# Patient Record
Sex: Male | Born: 1970 | Race: White | Hispanic: No | Marital: Single | Smoking: Current every day smoker
Health system: Southern US, Community
[De-identification: ages and names within clinical notes are randomized; demographics above are authoritative.]

## PROBLEM LIST (undated history)

## (undated) DIAGNOSIS — Z6841 Body Mass Index (BMI) 40.0 and over, adult: Secondary | ICD-10-CM

## (undated) DIAGNOSIS — R079 Chest pain, unspecified: Secondary | ICD-10-CM

## (undated) HISTORY — DX: Chest pain, unspecified: R07.9

## (undated) HISTORY — DX: Morbid (severe) obesity due to excess calories: E66.01

## (undated) HISTORY — DX: Body Mass Index (BMI) 40.0 and over, adult: Z684

---

## 2009-08-23 ENCOUNTER — Emergency Department: Payer: Self-pay | Admitting: Emergency Medicine

## 2009-11-17 ENCOUNTER — Emergency Department: Payer: Self-pay | Admitting: Emergency Medicine

## 2009-12-03 ENCOUNTER — Emergency Department: Payer: Self-pay | Admitting: Emergency Medicine

## 2010-10-24 ENCOUNTER — Emergency Department: Payer: Self-pay | Admitting: Emergency Medicine

## 2011-12-05 ENCOUNTER — Emergency Department (HOSPITAL_COMMUNITY): Payer: Self-pay

## 2011-12-05 ENCOUNTER — Observation Stay (HOSPITAL_COMMUNITY)
Admission: EM | Admit: 2011-12-05 | Discharge: 2011-12-06 | DRG: 313 | Disposition: A | Discharge status: Home / Self Care | Payer: Self-pay | Attending: Cardiology | Admitting: Cardiology

## 2011-12-05 ENCOUNTER — Encounter (HOSPITAL_COMMUNITY): Payer: Self-pay | Admitting: *Deleted

## 2011-12-05 DIAGNOSIS — E876 Hypokalemia: Secondary | ICD-10-CM | POA: Diagnosis present

## 2011-12-05 DIAGNOSIS — Z7982 Long term (current) use of aspirin: Secondary | ICD-10-CM

## 2011-12-05 DIAGNOSIS — E119 Type 2 diabetes mellitus without complications: Secondary | ICD-10-CM | POA: Diagnosis present

## 2011-12-05 DIAGNOSIS — Z79899 Other long term (current) drug therapy: Secondary | ICD-10-CM

## 2011-12-05 DIAGNOSIS — Z6841 Body Mass Index (BMI) 40.0 and over, adult: Secondary | ICD-10-CM

## 2011-12-05 DIAGNOSIS — R079 Chest pain, unspecified: Principal | ICD-10-CM | POA: Diagnosis present

## 2011-12-05 DIAGNOSIS — Z87891 Personal history of nicotine dependence: Secondary | ICD-10-CM

## 2011-12-05 DIAGNOSIS — Z8249 Family history of ischemic heart disease and other diseases of the circulatory system: Secondary | ICD-10-CM

## 2011-12-05 LAB — BASIC METABOLIC PANEL
CO2: 25 mEq/L (ref 19–32)
Calcium: 9 mg/dL (ref 8.4–10.5)
Glucose, Bld: 134 mg/dL — ABNORMAL HIGH (ref 70–99)
Sodium: 141 mEq/L (ref 135–145)

## 2011-12-05 LAB — CBC
Hemoglobin: 14.7 g/dL (ref 13.0–17.0)
MCH: 29.6 pg (ref 26.0–34.0)
RBC: 4.96 MIL/uL (ref 4.22–5.81)

## 2011-12-05 LAB — POCT I-STAT TROPONIN I: Troponin i, poc: 0 ng/mL (ref 0.00–0.08)

## 2011-12-05 LAB — GLUCOSE, CAPILLARY: Glucose-Capillary: 168 mg/dL — ABNORMAL HIGH (ref 70–99)

## 2011-12-05 MED ORDER — ONDANSETRON HCL 4 MG/2ML IJ SOLN
4.0000 mg | Freq: Once | INTRAMUSCULAR | Status: AC
  Administered 2011-12-05: 4 mg via INTRAVENOUS
  Filled 2011-12-05: qty 2

## 2011-12-05 MED ORDER — POTASSIUM CHLORIDE 20 MEQ/15ML (10%) PO LIQD
40.0000 meq | Freq: Once | ORAL | Status: AC
  Administered 2011-12-05: 40 meq via ORAL
  Filled 2011-12-05: qty 30

## 2011-12-05 MED ORDER — ASPIRIN 325 MG PO TABS: 325.0000 mg | ORAL_TABLET | ORAL | Status: DC

## 2011-12-05 MED ORDER — NITROGLYCERIN 0.4 MG SL SUBL: 0.4000 mg | SUBLINGUAL_TABLET | SUBLINGUAL | Status: DC | PRN

## 2011-12-05 NOTE — ED Provider Notes (Signed)
History     CSN: [id number]  Arrival date & time 12/05/11  1757   First MD Initiated Contact with Patient 12/05/11 1801      Chief Complaint  Patient presents with  . Chest Pain    (Consider location/radiation/quality/duration/timing/severity/associated sxs/prior treatment) HPI  41 y/o NIDDM male INAD c/o diaphoresis, nausea and SOB with pressure-like sternal chest pain 5/10, non radiating. Onset was while he was cleaning at 4 PM today. Pt then became lightheaded. Pt affirms palpations states he drinks energy drinks.  Pt also has a singe episode of diarrhea. Prior smoker quit 2 years ago.  Pt received 325 ASA and 4 SL nitroglycerin pain is 3/10. Pt's father died from CAD at 64.    No past medical history on file.  No past surgical history on file.  No family history on file.  History  Substance Use Topics  . Smoking status: Not on file  . Smokeless tobacco: Not on file  . Alcohol Use: Not on file      Review of Systems  Constitutional: Negative for fever.  Eyes: Negative for visual disturbance.  Respiratory: Positive for shortness of breath.   Cardiovascular: Positive for chest pain.  Gastrointestinal: Positive for nausea. Negative for abdominal pain.  Musculoskeletal: Negative for back pain.  Skin: Negative for rash.  Neurological: Positive for light-headedness. Negative for syncope.  All other systems reviewed and are negative.    Allergies  Review of patient's allergies indicates no known allergies.  Home Medications   Current Outpatient Rx  Name Route Sig Dispense Refill  . DIAZEPAM 10 MG PO TABS Oral Take 10 mg by mouth every 12 (twelve) hours as needed. For anxiety    . GLIPIZIDE 10 MG PO TABS Oral Take 10 mg by mouth 2 (two) times daily before a meal.    . METFORMIN HCL ER (OSM) 1000 MG PO TB24 Oral Take 1,000 mg by mouth 2 (two) times daily with a meal.    . SERTRALINE HCL 50 MG PO TABS Oral Take 25 mg by mouth daily.      There were no vitals  taken for this visit.  Physical Exam  Constitutional: He appears well-developed and well-nourished.       Obese  HENT:  Head: Normocephalic and atraumatic.  Eyes: Conjunctivae and EOM are normal. Pupils are equal, round, and reactive to light.  Neck: Normal range of motion. Neck supple.  Cardiovascular: Normal rate, regular rhythm, normal heart sounds and intact distal pulses.   Pulmonary/Chest: Effort normal and breath sounds normal. No respiratory distress. He has no wheezes. He has no rales. He exhibits no tenderness.  Abdominal: Soft. Bowel sounds are normal. There is no tenderness.  Musculoskeletal: He exhibits no edema.  Neurological: He is alert.  Skin: Skin is warm and dry.  Psychiatric: He has a normal mood and affect.    ED Course  Procedures (including critical care time)  Labs Reviewed  CBC - Abnormal; Notable for the following:    WBC 15.6 (*)     All other components within normal limits  BASIC METABOLIC PANEL - Abnormal; Notable for the following:    Potassium 3.0 (*)     Glucose, Bld 134 (*)     All other components within normal limits  D-DIMER, QUANTITATIVE  POCT I-STAT TROPONIN I   Chest Portable 1 View  12/05/2011  *RADIOLOGY REPORT*  Clinical Data: Chest pain  PORTABLE CHEST - 1 VIEW  Comparison: None.  Findings: Mildly increased interstitial markings.  Mild interstitial edema.  No focal consolidation.  No pleural effusion or pneumothorax.  The heart is top normal in size.  IMPRESSION: No evidence of acute cardiopulmonary disease.  Original Report Authenticated By: Charline Bills, M.D.     Date: 12/05/2011  Rate: 101    Rhythm: normal sinus rhythm  QRS Axis: normal  Intervals: normal  ST/T Wave abnormalities: normal  Conduction Disutrbances:none  Narrative Interpretation:   Old EKG Reviewed: none available   1. Chest pain       MDM  41 y/o diabetic male with chest pressure, SOB and Nausea. EKG shows no acute changes but shows mild sinus  tachycardia. Chest x-ray shows borderline cardiomegaly with no acute findings. D-dimer negative.   Pt found to be mildly hypokalemic at 3.1 and repeated orally with  Patient reexamined by me at 20:30 states pain has resolved fully denies nausea vomiting shortness of breath.  Discussed with attending who advises CDU  Cardiac protocol. As Pt is obese, he is not a candidate for CT and There will be no stress echo available as it is the weekend. Will consult cardiology   Cardiology consult by Dr. Maryelizabeth Kaufmann appreciated, patient will be admitted under his care for new onset angina evaluation.     Joni Reining Markeem Noreen, PA-C 12/06/11 0015

## 2011-12-05 NOTE — ED Notes (Signed)
Cleaning house, starting to have cp, diaphoretic about one hour ago; midsternal CP, described as pressure initially rated at 6/10, also a/w SOB, and nausea; 324mg  ASA given, 3 nitro given- now is pain free after 3rd nitro; 20g LFA, 2L [place] 98%, ST (100-110's) on monitor, BP 140/90, last BP 120 palp sys; hx DM

## 2011-12-05 NOTE — H&P (Signed)
Primary Cardiologist: Dr. Daleen Squibb Corinda Gubler)  Chief Complaint: chest pain  HPI:  Derrick Gonzales is a pleasant 41 year old gentleman with diabetes, obesity, presumptive sleep apnea not on CPAP, and a family history of premature coronary disease who presents with a chest pain.  At approximately 4pm today (7/19) he was exerting himself while moving into a new home, including activities like lifting. In an air-conditioned room, he suddenly felt hot and became diaphoretic, together with the development of central chest pressure which he rated at 6-7/10. It radiated to his left arm. He denies associated nausea. No palpitations. He called EMS and was given serial doses of SL NTG in the ambulance with some relief. His chest pain fully dissipated while in the [facility] Emergency Department. He is currently chest pain free and comfortable. Of note, he has never before experienced any episode similar to what he experienced today.   He otherwise denies shortness of breath. No palpitations. No headaches. He does snore and his wife notes that he occasionally will temporarily stop breathing and then spontaneously re-start. He had a sleep study in the past which was apparently positive, but he was never fitted for a CPAP mask.  Past Medical History  Diagnosis Date  . Diabetes mellitus   - Anger issues for which he occasionally takes Valium.   History reviewed. No pertinent past surgical history.  Social History:  reports that he has quit smoking. He does not have any smokeless tobacco history on file. He reports that he does not drink alcohol or use illicit drugs. Former smoker, quit 1.5 years ago, previously smoked 3 ppd for ~ 20 years  Family History: Premature CAD - father had CABG in his 30s and then passed away in his sleep at age 79; brother had MI in his 98s DM  Allergies: No Known Allergies  Medications: Metformin, unknown dose Glucotrol, unknown dose  ROS: As per HPI; otherwise is  comprehensively negative  Exam: 108/74 80 16 100% RA GEN no acute distress JVP 6-7 cm H2O CTA bilaterally S1 S2 no murmurs/rubs/gallops Abdomen obese, soft, nontender, nondistended Ext warm & well-perfused, no edema Neuro A&Ox3 Skin warm & dry  Labs:  Results for orders placed during the hospital encounter of 12/05/11 (from the past 48 hour(s))  CBC     Status: Abnormal   Collection Time   12/05/11  6:28 PM      Component Value Range Comment   WBC 15.6 (*) 4.0 - 10.5 K/uL    RBC 4.96  4.22 - 5.81 MIL/uL    Hemoglobin 14.7  13.0 - 17.0 g/dL    HCT 16.1  09.6 - 04.5 %    MCV 85.7  78.0 - 100.0 fL    MCH 29.6  26.0 - 34.0 pg    MCHC 34.6  30.0 - 36.0 g/dL    RDW 40.9  81.1 - 91.4 %    Platelets 239  150 - 400 K/uL   BASIC METABOLIC PANEL     Status: Abnormal   Collection Time   12/05/11  6:28 PM      Component Value Range Comment   Sodium 141  135 - 145 mEq/L    Potassium 3.0 (*) 3.5 - 5.1 mEq/L    Chloride 103  96 - 112 mEq/L    CO2 25  19 - 32 mEq/L    Glucose, Bld 134 (*) 70 - 99 mg/dL    BUN 10  6 - 23 mg/dL    Creatinine, Ser  0.56  0.50 - 1.35 mg/dL    Calcium 9.0  8.4 - 16.1 mg/dL    GFR calc non Af Amer >90  >90 mL/min    GFR calc Af Amer >90  >90 mL/min   D-DIMER, QUANTITATIVE     Status: Normal   Collection Time   12/05/11  6:28 PM      Component Value Range Comment   D-Dimer, Quant <0.22  0.00 - 0.48 ug/mL-FEU   POCT I-STAT TROPONIN I     Status: Normal   Collection Time   12/05/11  7:47 PM      Component Value Range Comment   Troponin i, poc 0.00  0.00 - 0.08 ng/mL    Comment 3            POCT I-STAT TROPONIN I     Status: Normal   Collection Time   12/05/11 10:34 PM      Component Value Range Comment   Troponin i, poc 0.01  0.00 - 0.08 ng/mL    Comment 3             ECG: normal sinus rhythm with ST segment changes; borderline inferior Q waves  Assessment/Plan 41 year old gentleman with a significant family history of premature CAD who presents  with an episode of chest tightness & diaphoresis concerning for a first-time occurrence of unstable angina. He is now chest pain free with no enzymatic or electrocardiographic evidence of acute or ongoing ischemia. He is a former heavy smoker (50-60 pack years) and has diabetes. Given his risk factors together with a compelling family history, I think he warrants inpatient admission for serial enzyme draws and close monitoring. Should his symptoms not recur and should his ECGs/enzymes not evolve, then I think we should pursue a provocative noninvasive test in the morning. If he has an evolution in his symptoms.ECGs/enzymes, then we can then pivot towards a more invasive evaluation via coronary angiography.   1) Chest pain, now resolved - Serial enzymes overnight with IV Heparin while we await further biomarker results - Aspirin, Statin therapy - Provocative testing vs cath per discussion above - Lipid profile to be checked, along with a HA1C  2) Hypokalemia - Will recheck a BMP and Mg and replete as necessary  3) Diabetes - Hold Metformin for now in light of the possibility of a dye load, depending on the course of events in the next 24-48 hours  I discussed my impressions with the patient and his family. Their questions were answered to the best of my ability.  Jamesina Gaugh 12/05/2011, 11:23 PM Cardiology Fellow On-Call [phone]

## 2011-12-05 NOTE — ED Notes (Signed)
CBG 168. 

## 2011-12-05 NOTE — ED Notes (Signed)
Pt placed on cardiac monitor 

## 2011-12-06 DIAGNOSIS — Z6841 Body Mass Index (BMI) 40.0 and over, adult: Secondary | ICD-10-CM

## 2011-12-06 DIAGNOSIS — R079 Chest pain, unspecified: Secondary | ICD-10-CM

## 2011-12-06 DIAGNOSIS — R072 Precordial pain: Secondary | ICD-10-CM

## 2011-12-06 HISTORY — DX: Morbid (severe) obesity due to excess calories: E66.01

## 2011-12-06 HISTORY — DX: Chest pain, unspecified: R07.9

## 2011-12-06 LAB — BASIC METABOLIC PANEL
BUN: 9 mg/dL (ref 6–23)
CO2: 29 mEq/L (ref 19–32)
Glucose, Bld: 247 mg/dL — ABNORMAL HIGH (ref 70–99)
Potassium: 3.8 mEq/L (ref 3.5–5.1)
Sodium: 135 mEq/L (ref 135–145)

## 2011-12-06 LAB — CARDIAC PANEL(CRET KIN+CKTOT+MB+TROPI)
CK, MB: 1.8 ng/mL (ref 0.3–4.0)
Relative Index: INVALID (ref 0.0–2.5)
Relative Index: INVALID (ref 0.0–2.5)
Total CK: 67 U/L (ref 7–232)
Total CK: 74 U/L (ref 7–232)
Total CK: 73 U/L (ref 7–232)
Troponin I: <0.3 ng/mL (ref <0.30)

## 2011-12-06 LAB — DIFFERENTIAL
Basophils Absolute: 0 10*3/uL (ref 0.0–0.1)
Basophils Relative: 0 % (ref 0–1)
Lymphocytes Relative: 35 % (ref 12–46)
Monocytes Absolute: 1 10*3/uL (ref 0.1–1.0)
Neutro Abs: 7 10*3/uL (ref 1.7–7.7)

## 2011-12-06 LAB — GLUCOSE, CAPILLARY: Glucose-Capillary: 140 mg/dL — ABNORMAL HIGH (ref 70–99)

## 2011-12-06 LAB — CBC
HCT: 39.7 % (ref 39.0–52.0)
Hemoglobin: 14.5 g/dL (ref 13.0–17.0)
MCH: 29.6 pg (ref 26.0–34.0)
MCHC: 34.5 g/dL (ref 30.0–36.0)
Platelets: 210 10*3/uL (ref 150–400)
RBC: 4.9 MIL/uL (ref 4.22–5.81)
RDW: 12.7 % (ref 11.5–15.5)
WBC: 12.9 10*3/uL — ABNORMAL HIGH (ref 4.0–10.5)

## 2011-12-06 LAB — MAGNESIUM: Magnesium: 2.1 mg/dL (ref 1.5–2.5)

## 2011-12-06 LAB — LIPID PANEL
HDL: 39 mg/dL — ABNORMAL LOW (ref >39)
LDL Cholesterol: 108 mg/dL — ABNORMAL HIGH (ref 0–99)
Triglycerides: 146 mg/dL (ref <150)

## 2011-12-06 LAB — HEMOGLOBIN A1C: Mean Plasma Glucose: 214 mg/dL — ABNORMAL HIGH (ref <117)

## 2011-12-06 LAB — TSH: TSH: 3.147 u[IU]/mL (ref 0.350–4.500)

## 2011-12-06 LAB — APTT: aPTT: 55 seconds — ABNORMAL HIGH (ref 24–37)

## 2011-12-06 LAB — CREATININE, SERUM
Creatinine, Ser: 0.64 mg/dL (ref 0.50–1.35)
GFR calc non Af Amer: >90 mL/min (ref >90)

## 2011-12-06 MED ORDER — ASPIRIN 81 MG PO TBEC: 81.0000 mg | DELAYED_RELEASE_TABLET | Freq: Every day | ORAL | Status: AC

## 2011-12-06 MED ORDER — ASPIRIN EC 81 MG PO TBEC: 81.0000 mg | DELAYED_RELEASE_TABLET | Freq: Every day | ORAL | Status: DC

## 2011-12-06 MED ORDER — ASPIRIN 81 MG PO CHEW: 324.0000 mg | CHEWABLE_TABLET | ORAL | Status: DC

## 2011-12-06 MED ORDER — POTASSIUM CHLORIDE CRYS ER 20 MEQ PO TBCR
40.0000 meq | EXTENDED_RELEASE_TABLET | Freq: Once | ORAL | Status: AC
  Administered 2011-12-06: 40 meq via ORAL
  Filled 2011-12-06: qty 2

## 2011-12-06 MED ORDER — ONDANSETRON HCL 4 MG/2ML IJ SOLN: 4.0000 mg | Freq: Four times a day (QID) | INTRAMUSCULAR | Status: DC | PRN

## 2011-12-06 MED ORDER — HEPARIN BOLUS VIA INFUSION
3000.0000 [IU] | Freq: Once | INTRAVENOUS | Status: AC
  Administered 2011-12-06: 3000 [IU] via INTRAVENOUS
  Filled 2011-12-06: qty 3000

## 2011-12-06 MED ORDER — NITROGLYCERIN 0.4 MG SL SUBL: 0.4000 mg | SUBLINGUAL_TABLET | SUBLINGUAL | Status: DC | PRN

## 2011-12-06 MED ORDER — ASPIRIN 300 MG RE SUPP
300.0000 mg | RECTAL | Status: DC
  Filled 2011-12-06: qty 1

## 2011-12-06 MED ORDER — HEPARIN (PORCINE) IN NACL 100-0.45 UNIT/ML-% IJ SOLN
1700.0000 [IU]/h | INTRAMUSCULAR | Status: DC
  Administered 2011-12-06: 1700 [IU]/h via INTRAVENOUS
  Administered 2011-12-06: 1400 [IU]/h via INTRAVENOUS
  Administered 2011-12-06: 1700 [IU]/h via INTRAVENOUS
  Filled 2011-12-06 (×4): qty 250

## 2011-12-06 MED ORDER — SODIUM CHLORIDE 0.9 % IV SOLN: 250.0000 mL | INTRAVENOUS | Status: DC | PRN

## 2011-12-06 MED ORDER — ACETAMINOPHEN 325 MG PO TABS: 650.0000 mg | ORAL_TABLET | ORAL | Status: DC | PRN

## 2011-12-06 MED ORDER — SODIUM CHLORIDE 0.9 % IJ SOLN: 3.0000 mL | INTRAMUSCULAR | Status: DC | PRN

## 2011-12-06 MED ORDER — GLIPIZIDE 10 MG PO TABS
10.0000 mg | ORAL_TABLET | Freq: Two times a day (BID) | ORAL | Status: DC
  Filled 2011-12-06 (×2): qty 1

## 2011-12-06 MED ORDER — HEPARIN BOLUS VIA INFUSION
4000.0000 [IU] | Freq: Once | INTRAVENOUS | Status: AC
  Administered 2011-12-06: 4000 [IU] via INTRAVENOUS

## 2011-12-06 MED ORDER — SODIUM CHLORIDE 0.9 % IJ SOLN: 3.0000 mL | Freq: Two times a day (BID) | INTRAMUSCULAR | Status: DC

## 2011-12-06 MED ORDER — ATORVASTATIN CALCIUM 20 MG PO TABS
20.0000 mg | ORAL_TABLET | Freq: Every day | ORAL | Status: DC
  Filled 2011-12-06: qty 1

## 2011-12-06 NOTE — Progress Notes (Signed)
Pt ambulated in hallway 300 ft and tolerated activity well. No chest pain while ambulating. Will continue to monitor.

## 2011-12-06 NOTE — Progress Notes (Signed)
Pt discharged per MD order. Discharge papers and teaching completed,all questions answered. All Rx'es given. Pt aware of follow up appointments.

## 2011-12-06 NOTE — ED Provider Notes (Signed)
Medical screening examination/treatment/procedure(s) were conducted as a shared visit with non-physician practitioner(s) and myself.  I personally evaluated the patient during the encounter   Curties Conigliaro, MD 12/06/11 1503 

## 2011-12-06 NOTE — Progress Notes (Signed)
SUBJECTIVE:  Patient has been pain free since yesterday evening at 6:30 PM.  Has been walking in the room without any difficulty or discomfort.  Yestrday, pain occurred with exertion.  OBJECTIVE:   Vitals:   Filed Vitals:   12/05/11 2230 12/06/11 0011 12/06/11 0131 12/06/11 0352  BP: 108/74 122/74 131/73 101/62  Pulse: 95 94 89 84  Temp:  98 F (36.7 C) 98.4 F (36.9 C) 97.3 F (36.3 C)  TempSrc:  Oral Oral Oral  Resp: 20 16 20 20   Height:  5\' 6"  (1.676 m) 5\' 6"  (1.676 m)   Weight:  136.079 kg (300 lb) 131.906 kg (290 lb 12.8 oz)   SpO2: 100% 99% 93% 94%   I&O's:   Intake/Output Summary (Last 24 hours) at 12/06/11 1326 Last data filed at 12/06/11 1244  Gross per 24 hour  Intake 311.17 ml  Output   1000 ml  Net -688.83 ml   TELEMETRY: Reviewed telemetry pt in NSR:     PHYSICAL EXAM General: Well developed, well nourished, in no acute distress Head:    Normal cephalic and atramatic  Lungs:   Clear bilaterally to auscultation and percussion. Heart:   HRRR S1 S2  Abdomen: obese Msk:   Normal strength and tone for age. Extremities:   No clubbing, cyanosis or edema.  DP +1 Neuro: Alert and oriented X 3. Psych:  Good affect, responds appropriately   LABS: Basic Metabolic Panel:  Basename 12/06/11 0330 12/05/11 1828  NA -- 141  K -- 3.0*  CL -- 103  CO2 -- 25  GLUCOSE -- 134*  BUN -- 10  CREATININE 0.64 0.56  CALCIUM -- 9.0  MG 2.1 --  PHOS -- --   Liver Function Tests: No results found for this basename: AST:2,ALT:2,ALKPHOS:2,BILITOT:2,PROT:2,ALBUMIN:2 in the last 72 hours No results found for this basename: LIPASE:2,AMYLASE:2 in the last 72 hours CBC:  Basename 12/06/11 0839 12/06/11 0330  WBC 11.5* 12.9*  NEUTROABS -- 7.0  HGB 14.5 13.7  HCT 42.3 39.7  MCV 86.3 86.1  PLT 219 210   Cardiac Enzymes:  Basename 12/06/11 0837 12/06/11 0330  CKTOTAL 74 67  CKMB 1.7 1.6  CKMBINDEX -- --  TROPONINI <0.30 <0.30   BNP: No components found with this  basename: POCBNP:3 D-Dimer:  [facility] 12/05/11 1828  DDIMER <0.22   Hemoglobin A1C: No results found for this basename: HGBA1C in the last 72 hours Fasting Lipid Panel:  Basename 12/06/11 0330  CHOL 176  HDL 39*  LDLCALC 108*  TRIG 146  CHOLHDL 4.5  LDLDIRECT --   Thyroid Function Tests: No results found for this basename: TSH,T4TOTAL,FREET3,T3FREE,THYROIDAB in the last 72 hours Anemia Panel: No results found for this basename: VITAMINB12,FOLATE,FERRITIN,TIBC,IRON,RETICCTPCT in the last 72 hours Coag Panel:   No results found for this basename: INR, PROTIME    RADIOLOGY: Chest Portable 1 View  12/05/2011  *RADIOLOGY REPORT*  Clinical Data: Chest pain  PORTABLE CHEST - 1 VIEW  Comparison: None.  Findings: Mildly increased interstitial markings.  Mild interstitial edema.  No focal consolidation.  No pleural effusion or pneumothorax.  The heart is top normal in size.  IMPRESSION: No evidence of acute cardiopulmonary disease.  Original Report Authenticated By: Charline Bills, M.D.      ASSESSMENT: Chest pain, hypokalemia,   PLAN:  Ruled out for MI so far.  One more set of enzymes pending.  Stop heparin.  If pain free with walking and last set of enzymes normal, would likely discharge for 2 day  outpatient cardiolite.  If he rules in or has recurrent sx, will plan for cath Monday as inpatient.  Recheck potasium with final set of cardiac enzymes.  Corky Crafts., MD  12/06/2011  1:26 PM

## 2011-12-06 NOTE — Discharge Summary (Signed)
CARDIOLOGY DISCHARGE SUMMARY   Patient ID: Derrick Gonzales MRN: [id number] DOB/AGE: 07/28/1970 41 y.o.  Admit date: 12/05/2011 Discharge date: 12/06/2011  Primary Discharge Diagnosis:  Chest pain, cardiac enzymes negative for MI and outpatient stress testing planned Secondary Discharge Diagnosis:  Past Medical History  Diagnosis Date   Mobid obesity   . Diabetes mellitus    Procedures: 2-D echocardiogram  Hospital Course: Derrick Gonzales is a 41 year old male with no previous history of coronary artery disease. He had prolonged chest pain with exertion and came to the hospital where he was admitted for further evaluation and treatment.  His chest pain resolved after he called EMS and was given nitroglycerin. His cardiac enzymes remained negative for MI and his ECG showed no acute changes.  His labs were reviewed. His white count was mildly elevated but there were no other signs or symptoms of infection. His blood sugar was slightly elevated but he is a known diabetic. His LDL was 108 but without further evidence of coronary artery disease, no statin will be initiated at this time.   On 12/06/2011, he was seen by Dr. Eldridge Dace. He had aspirin 81 mg daily added to his medication regimen and was given a prescription for sublingual nitroglycerin as well. He was ambulating without chest pain or shortness of breath and considered stable for discharge, to follow up as an outpatient with a stress test.   Labs:  Lab Results  Component Value Date   WBC 11.5* 12/06/2011   HGB 14.5 12/06/2011   HCT 42.3 12/06/2011   MCV 86.3 12/06/2011   PLT 219 12/06/2011    Lab 12/06/11 1339  NA 135  K 3.8  CL 97  CO2 29  BUN 9  CREATININE 0.68  CALCIUM 8.9  PROT --  BILITOT --  ALKPHOS --  ALT --  AST --  GLUCOSE 247*    Basename 12/06/11 1343 12/06/11 0837 12/06/11 0330  CKTOTAL 73 74 67  CKMB 1.8 1.7 1.6  CKMBINDEX -- -- --  TROPONINI <0.30 <0.30 <0.30   Lipid Panel     Component Value  Date/Time   CHOL 176 12/06/2011 0330   TRIG 146 12/06/2011 0330   HDL 39* 12/06/2011 0330   CHOLHDL 4.5 12/06/2011 0330   VLDL 29 12/06/2011 0330   LDLCALC 108* 12/06/2011 0330   Radiology: Chest Portable 1 View 12/05/2011  *RADIOLOGY REPORT*  Clinical Data: Chest pain  PORTABLE CHEST - 1 VIEW  Comparison: None.  Findings: Mildly increased interstitial markings.  Mild interstitial edema.  No focal consolidation.  No pleural effusion or pneumothorax.  The heart is top normal in size.  IMPRESSION: No evidence of acute cardiopulmonary disease.  Original Report Authenticated By: Charline Bills, M.D.   EKG: 06-Dec-2011 04:04:49  Normal sinus rhythm Normal ECG 38mm/s 62mm/mV 100Hz  8.0.1 12SL 241 HD CID: 1 Referred by: Maisie Fus WALL Confirmed By: Lance Muss MD Vent. rate 83 BPM PR interval 138 ms QRS duration 98 ms QT/QTc 386/453 ms P-R-T axes 46 52 43  Echo: 12/06/2011 Study Conclusions - Left ventricle: The cavity size was normal. There was mild focal basal hypertrophy of the septum. Systolic function was normal. The estimated ejection fraction was in the range of 60% to 65%. Wall motion was normal; there were no regional wall motion abnormalities. Features are consistent with a pseudonormal left ventricular filling pattern, with concomitant abnormal relaxation and increased filling pressure (grade 2 diastolic dysfunction). - Mitral valve: Trivial regurgitation. - Left atrium: The atrium was mildly dilated. -  Tricuspid valve: Trivial regurgitation. - Pericardium, extracardiac: There was no pericardial effusion.   FOLLOW UP PLANS AND APPOINTMENTS No Known Allergies  Medication List  As of 12/06/2011  3:45 PM   TAKE these medications         aspirin 81 MG EC tablet   Take 1 tablet (81 mg total) by mouth daily.      diazepam 10 MG tablet   Commonly known as: VALIUM   Take 10 mg by mouth every 12 (twelve) hours as needed. For anxiety      glipiZIDE 10 MG tablet   Commonly  known as: GLUCOTROL   Take 10 mg by mouth 2 (two) times daily before a meal.      metformin 1000 MG (OSM) 24 hr tablet   Commonly known as: FORTAMET   Take 1,000 mg by mouth 2 (two) times daily with a meal.      nitroGLYCERIN 0.4 MG SL tablet   Commonly known as: NITROSTAT   Place 1 tablet (0.4 mg total) under the tongue every 5 (five) minutes as needed for chest pain.      sertraline 50 MG tablet   Commonly known as: ZOLOFT   Take 25 mg by mouth daily.            Follow-up Information    Follow up with [facility] (The office will call.)    Contact information:   [address] [place] [phone]         BRING ALL MEDICATIONS WITH YOU TO FOLLOW UP APPOINTMENTS  Time spent with patient to include physician time: 33 min Signed: Theodore Demark 12/06/2011, 3:27 PM Co-Sign MD

## 2011-12-06 NOTE — Progress Notes (Signed)
ANTICOAGULATION CONSULT NOTE - Follow Up Consult  Pharmacy Consult for heparin Indication: chest pain/ACS  No Known Allergies  Patient Measurements: Height: 5\' 6"  (167.6 cm) Weight: 290 lb 12.8 oz (131.906 kg) IBW/kg (Calculated) : 63.8  Heparin Dosing Weight: 96 Kg  Vital Signs: Temp: 97.3 F (36.3 C) (07/20 0352) Temp src: Oral (07/20 0352) BP: 101/62 mmHg (07/20 0352) Pulse Rate: 84  (07/20 0352)  Labs:  Basename 12/06/11 0839 12/06/11 0330 12/05/11 1828  HGB 14.5 13.7 --  HCT 42.3 39.7 42.5  PLT 219 210 239  APTT -- 55* --  LABPROT -- -- --  INR -- -- --  HEPARINUNFRC 0.17* -- --  CREATININE -- 0.64 0.56  CKTOTAL -- 67 --  CKMB -- 1.6 --  TROPONINI -- <0.30 --    Estimated Creatinine Clearance: 159.6 ml/min (by C-G formula based on Cr of 0.64).   Medications:  Prescriptions prior to admission  Medication Sig Dispense Refill  . diazepam (VALIUM) 10 MG tablet Take 10 mg by mouth every 12 (twelve) hours as needed. For anxiety      . glipiZIDE (GLUCOTROL) 10 MG tablet Take 10 mg by mouth 2 (two) times daily before a meal.      . metformin (FORTAMET) 1000 MG (OSM) 24 hr tablet Take 1,000 mg by mouth 2 (two) times daily with a meal.      . sertraline (ZOLOFT) 50 MG tablet Take 25 mg by mouth daily.        Assessment: 41yo male with FH of premature CAD continues on IV heparin initiated for chest pain/ACS. Noted trop neg x 1 thus far. Heparin level is below goal. CBC is stable. Spoke with RN, no bleeding issues reported.  Goal of Therapy:  Heparin level 0.3-0.7 units/ml Monitor platelets by anticoagulation protocol: Yes   Plan:  - Heparin 3000 unit IV bolus then increase infusion to 1700 units/hr - Recheck heparin level at 1600 today - Will f/up along with you daily  Aleyssa Pike K. Allena Katz, PharmD, BCPS.  Clinical Pharmacist Pager [phone]. 12/06/2011 9:48 AM

## 2011-12-06 NOTE — Progress Notes (Signed)
ANTICOAGULATION CONSULT NOTE - Initial Consult  Pharmacy Consult for heparin Indication: chest pain/ACS  No Known Allergies  Patient Measurements: Height: 5\' 6"  (167.6 cm) Weight: 300 lb (136.079 kg) (Pt's estimate) IBW/kg (Calculated) : 63.8  Heparin Dosing Weight: 102kg  Vital Signs: Temp: 98 F (36.7 C) (07/20 0011) Temp src: Oral (07/20 0011) BP: 108/74 mmHg (07/19 2230) Pulse Rate: 94  (07/20 0011)  Labs:  Basename 12/05/11 1828  HGB 14.7  HCT 42.5  PLT 239  APTT --  LABPROT --  INR --  HEPARINUNFRC --  CREATININE 0.56  CKTOTAL --  CKMB --  TROPONINI --    Estimated Creatinine Clearance: 162.5 ml/min (by C-G formula based on Cr of 0.56).   Medical History: Past Medical History  Diagnosis Date  . Diabetes mellitus     Assessment: 41yo male with FH of premature CAD c/o CP that radiated to LUE, somewhat relieved with SL NTG, now CP-free, to begin heparin for r/o ACS; initial i-stat troponin negative x2, cycling enzymes overnight and considering cardiac cath.  Goal of Therapy:  Heparin level 0.3-0.7 units/ml Monitor platelets by anticoagulation protocol: Yes   Plan:  Will give heparin bolus of 4000 units x1 followed by gtt at 1400 units/hr and monitor heparin levels and CBC.  Colleen Can PharmD BCPS 12/06/2011,12:24 AM

## 2011-12-06 NOTE — Progress Notes (Signed)
  Echocardiogram 2D Echocardiogram has been performed.  FERNAND, SORBELLO 12/06/2011, 11:01 AM

## 2011-12-09 ENCOUNTER — Telehealth: Payer: Self-pay | Admitting: *Deleted

## 2011-12-09 MED ORDER — NITROGLYCERIN 0.4 MG SL SUBL: 0.4000 mg | SUBLINGUAL_TABLET | SUBLINGUAL | Status: DC | PRN

## 2011-12-09 NOTE — Telephone Encounter (Signed)
Pt not seen here prior, sent call to scheduling for app.

## 2011-12-09 NOTE — Telephone Encounter (Signed)
Pt states that he is becoming SOB with exertion.

## 2011-12-16 ENCOUNTER — Ambulatory Visit (HOSPITAL_COMMUNITY): Payer: Self-pay | Attending: Cardiology | Admitting: Radiology

## 2011-12-16 VITALS — BP 118/75 | Ht 66.0 in | Wt 308.0 lb

## 2011-12-16 DIAGNOSIS — R079 Chest pain, unspecified: Secondary | ICD-10-CM | POA: Insufficient documentation

## 2011-12-16 DIAGNOSIS — R0789 Other chest pain: Secondary | ICD-10-CM

## 2011-12-16 MED ORDER — REGADENOSON 0.4 MG/5ML IV SOLN
0.4000 mg | Freq: Once | INTRAVENOUS | Status: AC
  Administered 2011-12-16: 0.4 mg via INTRAVENOUS

## 2011-12-16 MED ORDER — TECHNETIUM TC 99M TETROFOSMIN IV KIT
33.0000 | PACK | Freq: Once | INTRAVENOUS | Status: AC | PRN
  Administered 2011-12-16: 33 via INTRAVENOUS

## 2011-12-16 NOTE — Progress Notes (Signed)
[facility] SITE 3 NUCLEAR MED [address] [place] [address] [phone]  Cardiology Nuclear Med Study  Derrick Gonzales is a 41 y.o. male     MRN : [id number]     DOB: 1971/04/13  Procedure Date: 12/16/2011  Nuclear Med Background Indication for Stress Test:  Evaluation for Ischemia, and Patient seen in hospital on 12-05-11 for CP, Enzymes negative History:  12/06/11 ECHO: EF: 60-65%  Cardiac Risk Factors: Family History - CAD, History of Smoking, Lipids, NIDDM and Obesity  Symptoms:  Chest Pain, Diaphoresis, Dizziness, DOE, Fatigue, Light-Headedness, Nausea and SOB   Nuclear Pre-Procedure Caffeine/Decaff Intake:  None > 12 hrs NPO After: 11:30pm   Lungs:  clear O2 Sat: 96% on room air. IV 0.9% NS with Angio Cath:  20g  IV Site: R Antecubital x 1, tolerated well IV Started by:  Irean Hong, RN  Chest Size (in):  56 Cup Size: n/a  Height: 5\' 6"  (1.676 m)  Weight:  308 lb (139.708 kg)  BMI:  Body mass index is 49.71 kg/(m^2). Tech Comments:  Held Glipizide and Metformin this am. The patient was unable to keep up with the treadmill so he was switched to a walking Lexiscan.    Nuclear Med Study 1 or 2 day study: 2 day  Stress Test Type:  Treadmill/Lexiscan  Reading MD: Marca Ancona, MD  Order Authorizing Provider:  Charlton Haws, MD  Resting Radionuclide: Technetium 44m Tetrofosmin   Resting Radionuclide Dose: 33.0 mCi  On       12-17-11    Stress Radionuclide:  Technetium 94m Tetrofosmin  Stress Radionuclide Dose: 33.0 mCi   On        12-16-11          Stress Protocol Rest HR: 78 Stress HR: 148  Rest BP: 118/75 Stress BP: 177/64  Exercise Time (min): 8:36 METS: 7.0   Predicted Max HR: 181 bpm % Max HR: 81.77 bpm Rate Pressure Product: 21308 0  Dose of Adenosine (mg):  n/a Dose of Lexiscan: 0.4 mg  Dose of Atropine (mg): n/a Dose of Dobutamine: n/a mcg/kg/min (at max HR)  Stress Test Technologist: Milana Na, EMT-P  Nuclear Technologist:   Domenic Polite, CNMT     Rest Procedure:  Myocardial perfusion imaging was performed at rest 45 minutes following the intravenous administration of Technetium 4m Tetrofosmin. Rest ECG: NSR - Normal EKG  Stress Procedure:  The patient received IV Lexiscan 0.4 mg over 15-seconds with concurrent low level exercise and then Technetium 78m Tetrofosmin was injected at 30-seconds while the patient continued walking one more minute. There were no significant changes, + sob, Lt. Headed, and dizzy with Lexiscan. Quantitative spect images were obtained after a 45-minute delay. Stress ECG: No significant change from baseline ECG  QPS Raw Data Images:  Normal; no motion artifact; normal heart/lung ratio. Stress Images:  Normal homogeneous uptake in all areas of the myocardium. Rest Images:  Normal homogeneous uptake in all areas of the myocardium. Subtraction (SDS):  Normal Transient Ischemic Dilatation (Normal <1.22):  1.02 Lung/Heart Ratio (Normal <0.45):  0.36  Quantitative Gated Spect Images QGS EDV:  112 ml QGS ESV:  43 ml  Impression Exercise Capacity:  Fair exercise capacity. BP Response:  Normal blood pressure response. Clinical Symptoms:  There is dyspnea. ECG Impression:  No significant ST segment change suggestive of ischemia. Comparison with Prior Nuclear Study: No images to compare  Overall Impression:  Normal stress nuclear study. Exercise with lexiscan  LV Ejection  Fraction: 62%.  LV Wall Motion:  NL LV Function; NL Wall Motion   .Charlton Haws

## 2011-12-17 ENCOUNTER — Ambulatory Visit (HOSPITAL_COMMUNITY): Payer: Self-pay | Attending: Cardiovascular Disease

## 2011-12-17 DIAGNOSIS — R0989 Other specified symptoms and signs involving the circulatory and respiratory systems: Secondary | ICD-10-CM

## 2011-12-17 MED ORDER — TECHNETIUM TC 99M TETROFOSMIN IV KIT
30.0000 | PACK | Freq: Once | INTRAVENOUS | Status: AC | PRN
  Administered 2011-12-17: 30 via INTRAVENOUS

## 2011-12-31 ENCOUNTER — Encounter: Payer: Self-pay | Admitting: *Deleted

## 2012-01-02 ENCOUNTER — Encounter: Payer: Self-pay | Admitting: Cardiovascular Disease

## 2012-01-02 ENCOUNTER — Ambulatory Visit (INDEPENDENT_AMBULATORY_CARE_PROVIDER_SITE_OTHER): Payer: 59 | Admitting: Cardiovascular Disease

## 2012-01-02 VITALS — BP 145/97 | HR 86 | Ht 66.0 in | Wt 318.1 lb

## 2012-01-02 DIAGNOSIS — Z6841 Body Mass Index (BMI) 40.0 and over, adult: Secondary | ICD-10-CM

## 2012-01-02 DIAGNOSIS — R079 Chest pain, unspecified: Secondary | ICD-10-CM

## 2012-01-02 DIAGNOSIS — E139 Other specified diabetes mellitus without complications: Secondary | ICD-10-CM

## 2012-01-02 DIAGNOSIS — E119 Type 2 diabetes mellitus without complications: Secondary | ICD-10-CM

## 2012-01-02 NOTE — Progress Notes (Signed)
Patient ID: Derrick Gonzales, male   DOB: 1971/03/19, 41 y.o.   MRN: [id number] Mr. Kocak is a 41 year old male patient of Dr Jim Like  with no previous history of coronary artery disease. He had prolonged chest pain with exertion and came to the hospital where he was admitted for further evaluation and treatment. On 12/05/11  His chest pain resolved after he called EMS and was given nitroglycerin. His cardiac enzymes remained negative for MI and his ECG showed no acute changes.  His labs were reviewed. His white count was mildly elevated but there were no other signs or symptoms of infection. His blood sugar was slightly elevated but he is a known diabetic. His LDL was 108 but without further evidence of coronary artery disease, no statin will be initiated at this time.  On 12/06/2011, he was seen by Dr. Eldridge Dace. He had aspirin 81 mg daily added to his medication regimen and was given a prescription for sublingual nitroglycerin as well. He was ambulating without chest pain or shortness of breath and considered stable for discharge, to follow up as an outpatient with a stress test.  Echo 7/20  Study Conclusions  - Left ventricle: The cavity size was normal. There was mild focal basal hypertrophy of the septum. Systolic function was normal. The estimated ejection fraction was in the range of 60% to 65%. Wall motion was normal; there were no regional wall motion abnormalities. Features are consistent with a pseudonormal left ventricular filling pattern, with concomitant abnormal relaxation and increased filling pressure (grade 2 diastolic dysfunction). - Mitral valve: Trivial regurgitation. - Left atrium: The atrium was mildly dilated. - Tricuspid valve: Trivial regurgitation. - Pericardium, extracardiac: There was no pericardial effusion.  Myovue done 7/20 normal  Overall Impression: Normal stress nuclear study. Exercise with lexiscan  LV Ejection Fraction: 62%. LV Wall Motion: NL LV  Function; NL Wall Motion  ROS: Denies fever, malais, weight loss, blurry vision, decreased visual acuity, cough, sputum, SOB, hemoptysis, pleuritic pain, palpitaitons, heartburn, abdominal pain, melena, lower extremity edema, claudication, or rash.  All other systems reviewed and negative  General: Affect appropriate Overweight male HEENT: normal Neck supple with no adenopathy JVP normal no bruits no thyromegaly Lungs clear with no wheezing and good diaphragmatic motion Heart:  S1/S2 no murmur, no rub, gallop or click PMI normal Abdomen: benighn, BS positve, no tenderness, no AAA no bruit.  No HSM or HJR Distal pulses intact with no bruits No edema Neuro non-focal Skin warm and dry  mulitple tatoos No muscular weakness   Current Outpatient Prescriptions  Medication Sig Dispense Refill  . aspirin EC 81 MG EC tablet Take 1 tablet (81 mg total) by mouth daily.      Marland Kitchen atorvastatin (LIPITOR) 20 MG tablet Take 20 mg by mouth daily.      . diazepam (VALIUM) 10 MG tablet Take 10 mg by mouth every 12 (twelve) hours as needed. For anxiety      . glipiZIDE (GLUCOTROL) 10 MG tablet Take 10 mg by mouth 2 (two) times daily before a meal.      . metformin (FORTAMET) 1000 MG (OSM) 24 hr tablet Take 1,000 mg by mouth 2 (two) times daily with a meal.      . nitroGLYCERIN (NITROSTAT) 0.4 MG SL tablet Place 1 tablet (0.4 mg total) under the tongue every 5 (five) minutes as needed for chest pain.  25 tablet  1  . sertraline (ZOLOFT) 50 MG tablet Take 25 mg by mouth daily.  Allergies  Review of patient's allergies indicates no known allergies.  Electrocardiogram:  12/06/28  NSR normal ECG  Assessment and Plan

## 2012-01-02 NOTE — Assessment & Plan Note (Signed)
Discussed low carb diet.  Target hemoglobin A1c is 6.5 or less.  Continue current medications. Long discussoin regarding exercise and weight loss.  A1c in 8 range  F/U Dr Duke Salvia in [place]

## 2012-01-02 NOTE — Assessment & Plan Note (Signed)
Noncardiac with R/O normal ECG, echo and myovue.  ETT every 3 years with poorly controlled DM

## 2012-01-02 NOTE — Assessment & Plan Note (Signed)
Discussed weight loss and relationship of DM to obesity.

## 2012-01-02 NOTE — Patient Instructions (Signed)
Your physician recommends that you schedule a follow-up appointment in: AS NEEDED  Your physician recommends that you continue on your current medications as directed. Please refer to the Current Medication list given to you today.  

## 2012-02-19 ENCOUNTER — Emergency Department: Payer: Self-pay | Admitting: Emergency Medicine

## 2013-01-07 ENCOUNTER — Emergency Department: Payer: Self-pay | Admitting: Emergency Medicine

## 2013-01-07 LAB — COMPREHENSIVE METABOLIC PANEL
Alkaline Phosphatase: 83 U/L (ref 50–136)
Anion Gap: 6 — ABNORMAL LOW (ref 7–16)
BUN: 6 mg/dL — ABNORMAL LOW (ref 7–18)
Calcium, Total: 8.8 mg/dL (ref 8.5–10.1)
Co2: 26 mmol/L (ref 21–32)
EGFR (African American): >60
EGFR (Non-African Amer.): >60
Sodium: 136 mmol/L (ref 136–145)
Total Protein: 7.7 g/dL (ref 6.4–8.2)

## 2013-01-07 LAB — URINALYSIS, COMPLETE
Bacteria: NONE SEEN
Blood: NEGATIVE
Glucose,UR: 300 mg/dL (ref 0–75)
Ketone: NEGATIVE
Nitrite: NEGATIVE
Ph: 6 (ref 4.5–8.0)
Protein: NEGATIVE
RBC,UR: 2 /HPF (ref 0–5)
Squamous Epithelial: <1
WBC UR: 3 /HPF (ref 0–5)

## 2013-01-07 LAB — CBC
MCHC: 34.8 g/dL (ref 32.0–36.0)
Platelet: 182 10*3/uL (ref 150–440)
RBC: 5.36 10*6/uL (ref 4.40–5.90)
RDW: 12.6 % (ref 11.5–14.5)
WBC: 10.4 10*3/uL (ref 3.8–10.6)

## 2013-04-05 ENCOUNTER — Emergency Department: Payer: Self-pay | Admitting: Emergency Medicine

## 2013-04-05 LAB — URINALYSIS, COMPLETE
Bacteria: NONE SEEN
Bilirubin,UR: NEGATIVE
Blood: NEGATIVE
Glucose,UR: >=500 mg/dL (ref 0–75)
Ketone: NEGATIVE
Leukocyte Esterase: NEGATIVE
Nitrite: NEGATIVE
Protein: NEGATIVE
RBC,UR: 1 /HPF (ref 0–5)
Specific Gravity: 1.027 (ref 1.003–1.030)

## 2013-04-05 LAB — COMPREHENSIVE METABOLIC PANEL
Albumin: 3.4 g/dL (ref 3.4–5.0)
Alkaline Phosphatase: 89 U/L (ref 50–136)
Anion Gap: 8 (ref 7–16)
BUN: 12 mg/dL (ref 7–18)
Bilirubin,Total: 0.3 mg/dL (ref 0.2–1.0)
Calcium, Total: 8.5 mg/dL (ref 8.5–10.1)
Co2: 27 mmol/L (ref 21–32)
Creatinine: 0.85 mg/dL (ref 0.60–1.30)
EGFR (African American): >60
EGFR (Non-African Amer.): >60
Osmolality: 291 (ref 275–301)
Potassium: 4 mmol/L (ref 3.5–5.1)
SGPT (ALT): 23 U/L (ref 12–78)
Total Protein: 7.5 g/dL (ref 6.4–8.2)

## 2013-04-05 LAB — CBC
HCT: 45.8 % (ref 40.0–52.0)
HGB: 15.6 g/dL (ref 13.0–18.0)
RBC: 5.2 10*6/uL (ref 4.40–5.90)
WBC: 14 10*3/uL — ABNORMAL HIGH (ref 3.8–10.6)

## 2013-04-05 LAB — CK TOTAL AND CKMB (NOT AT ARMC)
CK, Total: 69 U/L (ref 35–232)
CK-MB: 0.7 ng/mL (ref 0.5–3.6)

## 2013-08-20 ENCOUNTER — Emergency Department: Payer: Self-pay | Admitting: Emergency Medicine

## 2013-09-23 ENCOUNTER — Emergency Department: Payer: Self-pay | Admitting: Emergency Medicine

## 2013-09-23 LAB — COMPREHENSIVE METABOLIC PANEL
ALK PHOS: 66 U/L
Albumin: 3.2 g/dL — ABNORMAL LOW (ref 3.4–5.0)
Anion Gap: 4 — ABNORMAL LOW (ref 7–16)
BILIRUBIN TOTAL: 0.3 mg/dL (ref 0.2–1.0)
BUN: 10 mg/dL (ref 7–18)
Calcium, Total: 8.6 mg/dL (ref 8.5–10.1)
Chloride: 105 mmol/L (ref 98–107)
Co2: 30 mmol/L (ref 21–32)
Creatinine: 0.78 mg/dL (ref 0.60–1.30)
EGFR (African American): >60
GLUCOSE: 267 mg/dL — AB (ref 65–99)
Osmolality: 286 (ref 275–301)
Potassium: 4.1 mmol/L (ref 3.5–5.1)
SGOT(AST): 21 U/L (ref 15–37)
SGPT (ALT): 23 U/L (ref 12–78)
SODIUM: 139 mmol/L (ref 136–145)
Total Protein: 6.9 g/dL (ref 6.4–8.2)

## 2013-09-23 LAB — URINALYSIS, COMPLETE
BILIRUBIN, UR: NEGATIVE
Bacteria: NONE SEEN
Blood: NEGATIVE
Glucose,UR: 100 mg/dL (ref 0–75)
Ketone: NEGATIVE
LEUKOCYTE ESTERASE: NEGATIVE
Nitrite: NEGATIVE
Ph: 5 (ref 4.5–8.0)
Protein: NEGATIVE
RBC,UR: 1 /HPF (ref 0–5)
SQUAMOUS EPITHELIAL: NONE SEEN
Specific Gravity: 1.02 (ref 1.003–1.030)
WBC UR: 1 /HPF (ref 0–5)

## 2013-09-23 LAB — CBC
HCT: 42.2 % (ref 40.0–52.0)
HGB: 14.3 g/dL (ref 13.0–18.0)
MCH: 30.2 pg (ref 26.0–34.0)
MCHC: 33.8 g/dL (ref 32.0–36.0)
MCV: 89 fL (ref 80–100)
Platelet: 187 10*3/uL (ref 150–440)
RBC: 4.73 10*6/uL (ref 4.40–5.90)
RDW: 13.3 % (ref 11.5–14.5)
WBC: 9.4 10*3/uL (ref 3.8–10.6)

## 2014-04-03 ENCOUNTER — Emergency Department: Payer: Self-pay | Admitting: Emergency Medicine

## 2014-04-03 LAB — CBC WITH DIFFERENTIAL/PLATELET
BASOS ABS: 0.1 10*3/uL (ref 0.0–0.1)
Basophil %: 0.5 %
EOS ABS: 0.4 10*3/uL (ref 0.0–0.7)
Eosinophil %: 3.3 %
HCT: 46.2 % (ref 40.0–52.0)
HGB: 15.2 g/dL (ref 13.0–18.0)
Lymphocyte #: 3.2 10*3/uL (ref 1.0–3.6)
Lymphocyte %: 24 %
MCH: 29.6 pg (ref 26.0–34.0)
MCHC: 32.9 g/dL (ref 32.0–36.0)
MCV: 90 fL (ref 80–100)
Monocyte #: 1 x10 3/mm (ref 0.2–1.0)
Monocyte %: 7.4 %
NEUTROS ABS: 8.7 10*3/uL — AB (ref 1.4–6.5)
NEUTROS PCT: 64.8 %
PLATELETS: 212 10*3/uL (ref 150–440)
RBC: 5.13 10*6/uL (ref 4.40–5.90)
RDW: 12.6 % (ref 11.5–14.5)
WBC: 13.4 10*3/uL — ABNORMAL HIGH (ref 3.8–10.6)

## 2014-04-03 LAB — COMPREHENSIVE METABOLIC PANEL
ALK PHOS: 85 U/L
ALT: 27 U/L
ANION GAP: 6 — AB (ref 7–16)
Albumin: 3.4 g/dL (ref 3.4–5.0)
BILIRUBIN TOTAL: 0.5 mg/dL (ref 0.2–1.0)
BUN: 10 mg/dL (ref 7–18)
CALCIUM: 8.7 mg/dL (ref 8.5–10.1)
CREATININE: 0.84 mg/dL (ref 0.60–1.30)
Chloride: 100 mmol/L (ref 98–107)
Co2: 30 mmol/L (ref 21–32)
EGFR (African American): >60
EGFR (Non-African Amer.): >60
Glucose: 402 mg/dL — ABNORMAL HIGH (ref 65–99)
Osmolality: 288 (ref 275–301)
Potassium: 4.3 mmol/L (ref 3.5–5.1)
SGOT(AST): 10 U/L — ABNORMAL LOW (ref 15–37)
Sodium: 136 mmol/L (ref 136–145)
Total Protein: 7.8 g/dL (ref 6.4–8.2)

## 2014-04-08 LAB — CULTURE, BLOOD (SINGLE)

## 2014-06-11 ENCOUNTER — Encounter (HOSPITAL_COMMUNITY): Payer: Self-pay | Admitting: Emergency Medicine

## 2014-06-11 ENCOUNTER — Emergency Department (HOSPITAL_COMMUNITY)
Admission: EM | Admit: 2014-06-11 | Discharge: 2014-06-11 | Disposition: A | Discharge status: Home / Self Care | Payer: Medicaid Other | Attending: Emergency Medicine | Admitting: Emergency Medicine

## 2014-06-11 DIAGNOSIS — Z79899 Other long term (current) drug therapy: Secondary | ICD-10-CM | POA: Diagnosis not present

## 2014-06-11 DIAGNOSIS — R21 Rash and other nonspecific skin eruption: Secondary | ICD-10-CM

## 2014-06-11 DIAGNOSIS — Z87891 Personal history of nicotine dependence: Secondary | ICD-10-CM | POA: Diagnosis not present

## 2014-06-11 DIAGNOSIS — E119 Type 2 diabetes mellitus without complications: Secondary | ICD-10-CM | POA: Insufficient documentation

## 2014-06-11 DIAGNOSIS — L309 Dermatitis, unspecified: Secondary | ICD-10-CM | POA: Diagnosis not present

## 2014-06-11 LAB — CBG MONITORING, ED: GLUCOSE-CAPILLARY: 329 mg/dL — AB (ref 70–99)

## 2014-06-11 MED ORDER — HYDROCORTISONE 1 % EX CREA: TOPICAL_CREAM | CUTANEOUS | Status: DC

## 2014-06-11 NOTE — ED Notes (Signed)
Pt from home c/o itchy rash to arms, legs, and abdomen x 4 days. He has been using calmine, benadryl, and gold bond without relief.

## 2014-06-11 NOTE — ED Provider Notes (Signed)
CSN: [id number]     Arrival date & time 06/11/14  1238 History  This chart was scribed for a non-physician practitioner, Jamse Mead, PA-C working with Malvin Johns, MD by Martinique Peace, ED Scribe. The patient was seen in [facility]. The patient's care was started at 1:37 PM.    Chief Complaint  Patient presents with  . Rash      Patient is a 44 y.o. male presenting with rash. The history is provided by the patient. No language interpreter was used.  Rash Location:  Leg, torso and shoulder/arm Shoulder/arm rash location:  R upper arm and L upper arm Torso rash location:  Abd LUQ and abd RUQ Leg rash location:  R lower leg and L lower leg Severity:  Severe Duration:  4 days Relieved by:  Nothing Ineffective treatments:  Anti-fungal cream and anti-itch cream (Benadryl) Associated symptoms: no fever, no nausea, no shortness of breath, no throat swelling, no tongue swelling and not vomiting     HPI Comments: Derrick Gonzales is a 44 y.o. male with PMHx of DM who presents to the Emergency Department complaining of rash onset 4 days that originated on the posterior aspects of both of his legs before extending up to his buttocks, back, and arms. Pt has tried using Calamine lotion, Gold Bond eczema cream and Benadryl without any relief.   He notes similar occurrence one year ago that he states cleared up after the use of eczema cream. No reports of any recent travels. He further denies using any new lotions, soaps, cologne, detergents, or anything of that nature. He denies any fever, chills, neck pain, neck stiffness, chest pain, SOB, trouble breathing, syncope, tongue swelling, lip swallowing, trouble swallowing, throat closing sensation.  PCP none   Past Medical History  Diagnosis Date  . Diabetes mellitus   . Morbid obesity with BMI of 45.0-49.9, adult 12/06/2011   History reviewed. No pertinent past surgical history. Family History  Problem Relation Age of Onset  . Coronary artery  disease     History  Substance Use Topics  . Smoking status: Former Research scientist (life sciences)  . Smokeless tobacco: Not on file  . Alcohol Use: No    Review of Systems  Constitutional: Negative for fever and chills.  HENT: Negative for trouble swallowing.   Respiratory: Negative for shortness of breath.   Cardiovascular: Negative for chest pain.  Gastrointestinal: Negative for nausea and vomiting.  Musculoskeletal: Negative for neck pain and neck stiffness.  Skin: Positive for rash.  Neurological: Negative for syncope.      Allergies  Review of patient's allergies indicates no known allergies.  Home Medications   Prior to Admission medications   Medication Sig Start Date End Date Taking? Authorizing Provider  calamine lotion Apply 1 application topically as needed for itching.   Yes Historical Provider, MD  diazepam (VALIUM) 10 MG tablet Take 10 mg by mouth every 12 (twelve) hours as needed. For anxiety   Yes Historical Provider, MD  diphenhydrAMINE-zinc acetate (BENADRYL) cream Apply 1 application topically 3 (three) times daily as needed for itching.   Yes Historical Provider, MD  glipiZIDE (GLUCOTROL) 10 MG tablet Take 10 mg by mouth 2 (two) times daily before a meal.   Yes Historical Provider, MD  metformin (FORTAMET) 1000 MG (OSM) 24 hr tablet Take 1,000 mg by mouth 2 (two) times daily with a meal.   Yes Historical Provider, MD  hydrocortisone cream 1 % Apply to affected area 2 times daily 06/11/14   Jamse Mead,  PA-C  nitroGLYCERIN (NITROSTAT) 0.4 MG SL tablet Place 1 tablet (0.4 mg total) under the tongue every 5 (five) minutes as needed for chest pain. 12/09/11 12/08/12  Gaylord Shihhomas C Wall, MD   BP 144/87 mmHg  Pulse 86  Temp(Src) 98 F (36.7 C) (Oral)  Resp 18  SpO2 100% Physical Exam  Constitutional: He is oriented to person, place, and time. He appears well-developed and well-nourished. No distress.  HENT:  Head: Normocephalic and atraumatic.  Mouth/Throat: Oropharynx is clear and  moist. No oropharyngeal exudate.  Negative swelling, erythema, inflammation, lesions, sores, deformities, petechiae identified to the posterior oropharynx and bilateral tonsils. Negative trismus. Uvula midline with symmetrical elevation.  Eyes: Conjunctivae and EOM are normal. Pupils are equal, round, and reactive to light. Right eye exhibits no discharge. Left eye exhibits no discharge.  Neck: Normal range of motion. Neck supple. No tracheal deviation present.  Cardiovascular: Normal rate, regular rhythm and normal heart sounds.  Exam reveals no friction rub.   No murmur heard. Pulses:      Radial pulses are 2+ on the right side, and 2+ on the left side.  Pulmonary/Chest: Effort normal and breath sounds normal. No respiratory distress. He has no wheezes. He has no rales.  Patient is able to speak in full sentences without difficulty Negative use of accessory muscles Negative stridor  Abdominal:  Obese  Musculoskeletal: Normal range of motion.  Full ROM to upper and lower extremities without difficulty noted, negative ataxia noted.  Lymphadenopathy:    He has no cervical adenopathy.  Neurological: He is alert and oriented to person, place, and time.  Skin: Skin is warm and dry. Rash noted. No erythema.  Erythematous rash localized to the areas of flexion - antecubital fossa bilaterally and posterior aspects of the knees bilaterally. Blanchable. Negative excoriation of the skin, plaques, ulcerations, bleeding or pus drainage identified. Negative red streaks.  Psychiatric: He has a normal mood and affect. His behavior is normal. Thought content normal.  Nursing note and vitals reviewed.   ED Course  Procedures (including critical care time) Labs Review Labs Reviewed  CBG MONITORING, ED - Abnormal; Notable for the following:    Glucose-Capillary 329 (*)    All other components within normal limits    Imaging Review No results found.   EKG Interpretation None     Medications - No  data to display  1:42 PM- Treatment plan was discussed with patient who verbalizes understanding and agrees.   2:12 PM CBG checked and identified to be 329-patient reported that he has not taken his medications today for his diabetes. Patient currently drinking Pepsi in the room as per tech.  MDM   Final diagnoses:  Rash and nonspecific skin eruption  Eczema    Medications - No data to display  Filed Vitals:   06/11/14 1245  BP: 144/87  Pulse: 86  Temp: 98 F (36.7 C)  TempSrc: Oral  Resp: 18  SpO2: 100%     I personally performed the services described in this documentation, which was scribed in my presence. The recorded information has been reviewed and is accurate.  Doubt erythema multiforme major and minor. Doubt scabies. Doubt bedbugs. Doubt shingles. Doubt varus sella. Suspicion to be eczema-patient's history of this in the past for similar symptoms. Patient stable, afebrile. Patient not septic appearing. Discharged patient. Discharge patient with hydrocortisone cream. Discussed with patient to rest and stay hydrated. Educated patient on importance of monitoring CBG and for taking medications - discussed with patient  complications regarding poor controlled diabetes-patient understood. Referred patient to [facility] and PCP. Discussed with patient to closely monitor symptoms and if symptoms are to worsen or change to report back to the ED - strict return instructions given.  Patient agreed to plan of care, understood, all questions answered.   Jamse Mead, PA-C 06/11/14 [place], MD 06/11/14 1550

## 2014-06-11 NOTE — Discharge Instructions (Signed)
Please call your doctor for a followup appointment within 24-48 hours. When you talk to your doctor please let them know that you were seen in the emergency department and have them acquire all of your records so that they can discuss the findings with you and formulate a treatment plan to fully care for your new and ongoing problems. [facility] with [facility] Please follow up with dermatologist Please use cream as prescribed - please use sparingly, small amount on skin  Please is important that you monitor your sugar levels daily for diabetes is not controlled can lead to loss of vision, heart attack, loss of limbs, death Please continue to monitor symptoms closely and if symptoms are to worsen or change (fever greater than 101, chills, sweating, nausea, vomiting, chest pain, shortness of breathe, difficulty breathing, weakness, numbness, tingling, worsening or changes to pain pattern, red streaks, drainage, pus, swelling, headache, dizziness) please report back to the Emergency Department immediately.    Eczema Eczema, also called atopic dermatitis, is a skin disorder that causes inflammation of the skin. It causes a red rash and dry, scaly skin. The skin becomes very itchy. Eczema is generally worse during the cooler winter months and often improves with the warmth of summer. Eczema usually starts showing signs in infancy. Some children outgrow eczema, but it may last through adulthood.  CAUSES  The exact cause of eczema is not known, but it appears to run in families. People with eczema often have a family history of eczema, allergies, asthma, or hay fever. Eczema is not contagious. Flare-ups of the condition may be caused by:   Contact with something you are sensitive or allergic to.   Stress. SIGNS AND SYMPTOMS  Dry, scaly skin.   Red, itchy rash.   Itchiness. This may occur before the skin rash and may be very intense.  DIAGNOSIS  The diagnosis of eczema is  usually made based on symptoms and medical history. TREATMENT  Eczema cannot be cured, but symptoms usually can be controlled with treatment and other strategies. A treatment plan might include:  Controlling the itching and scratching.   Use over-the-counter antihistamines as directed for itching. This is especially useful at night when the itching tends to be worse.   Use over-the-counter steroid creams as directed for itching.   Avoid scratching. Scratching makes the rash and itching worse. It may also result in a skin infection (impetigo) due to a break in the skin caused by scratching.   Keeping the skin well moisturized with creams every day. This will seal in moisture and help prevent dryness. Lotions that contain alcohol and water should be avoided because they can dry the skin.   Limiting exposure to things that you are sensitive or allergic to (allergens).   Recognizing situations that cause stress.   Developing a plan to manage stress.  HOME CARE INSTRUCTIONS   Only take over-the-counter or prescription medicines as directed by your health care provider.   Do not use anything on the skin without checking with your health care provider.   Keep baths or showers short (5 minutes) in warm (not hot) water. Use mild cleansers for bathing. These should be unscented. You may add nonperfumed bath oil to the bath water. It is best to avoid soap and bubble bath.   Immediately after a bath or shower, when the skin is still damp, apply a moisturizing ointment to the entire body. This ointment should be a petroleum ointment. This will seal  in moisture and help prevent dryness. The thicker the ointment, the better. These should be unscented.   Keep fingernails cut short. Children with eczema may need to wear soft gloves or mittens at night after applying an ointment.   Dress in clothes made of cotton or cotton blends. Dress lightly, because heat increases itching.   A child  with eczema should stay away from anyone with fever blisters or cold sores. The virus that causes fever blisters (herpes simplex) can cause a serious skin infection in children with eczema. SEEK MEDICAL CARE IF:   Your itching interferes with sleep.   Your rash gets worse or is not better within 1 week after starting treatment.   You see pus or soft yellow scabs in the rash area.   You have a fever.   You have a rash flare-up after contact with someone who has fever blisters.  Document Released: 05/02/2000 Document Revised: 02/23/2013 Document Reviewed: 12/06/2012 [facility] Patient Information 2015 [place], [place]. This information is not intended to replace advice given to you by your health care provider. Make sure you discuss any questions you have with your health care provider. Rash A rash is a change in the color or texture of your skin. There are many different types of rashes. You may have other problems that accompany your rash. CAUSES   Infections.  Allergic reactions. This can include allergies to pets or foods.  Certain medicines.  Exposure to certain chemicals, soaps, or cosmetics.  Heat.  Exposure to poisonous plants.  Tumors, both cancerous and noncancerous. SYMPTOMS   Redness.  Scaly skin.  Itchy skin.  Dry or cracked skin.  Bumps.  Blisters.  Pain. DIAGNOSIS  Your caregiver may do a physical exam to determine what type of rash you have. A skin sample (biopsy) may be taken and examined under a microscope. TREATMENT  Treatment depends on the type of rash you have. Your caregiver may prescribe certain medicines. For serious conditions, you may need to see a skin doctor (dermatologist). HOME CARE INSTRUCTIONS   Avoid the substance that caused your rash.  Do not scratch your rash. This can cause infection.  You may take cool baths to help stop itching.  Only take over-the-counter or prescription medicines as directed by your caregiver.  Keep  all follow-up appointments as directed by your caregiver. SEEK IMMEDIATE MEDICAL CARE IF:  You have increasing pain, swelling, or redness.  You have a fever.  You have new or severe symptoms.  You have body aches, diarrhea, or vomiting.  Your rash is not better after 3 days. MAKE SURE YOU:  Understand these instructions.  Will watch your condition.  Will get help right away if you are not doing well or get worse. Document Released: 04/25/2002 Document Revised: 07/28/2011 Document Reviewed: 02/17/2011 [facility] Patient Information 2015 [place], [place]. This information is not intended to replace advice given to you by your health care provider. Make sure you discuss any questions you have with your health care provider.   Emergency Department Resource Guide 1) Find a Doctor and Pay Out of Pocket Although you won't have to find out who is covered by your insurance plan, it is a good idea to ask around and get recommendations. You will then need to call the office and see if the doctor you have chosen will accept you as a new patient and what types of options they offer for patients who are self-pay. Some doctors offer discounts or will set up payment plans for  their patients who do not have insurance, but you will need to ask so you aren't surprised when you get to your appointment.  2) Contact Your Local Health Department Not all health departments have doctors that can see patients for sick visits, but many do, so it is worth a call to see if yours does. If you don't know where your local health department is, you can check in your phone book. The CDC also has a tool to help you locate your state's health department, and many state websites also have listings of all of their local health departments.  3) Find a [facility] If your illness is not likely to be very severe or complicated, you may want to try a walk in clinic. These are popping up all over the country in pharmacies,  drugstores, and shopping centers. They're usually staffed by nurse practitioners or physician assistants that have been trained to treat common illnesses and complaints. They're usually fairly quick and inexpensive. However, if you have serious medical issues or chronic medical problems, these are probably not your best option.  No Primary Care Doctor: - Call Health Connect at  [phone] - they can help you locate a primary care doctor that  accepts your insurance, provides certain services, etc. - Physician Referral Service- [phone]  Chronic Pain Problems: Organization         Address  Phone   Notes  [facility]  [phone] Patients need to be referred by their primary care doctor.   Medication Assistance: Organization         Address  Phone   Notes  [facility] Medication [facility] [address] [place], [place] [address] [phone] --Must be a resident of [facility] -- Must have NO insurance coverage whatsoever (no Medicaid/ Medicare, etc.) -- The pt. MUST have a primary care doctor that directs their care regularly and follows them in the community   MedAssist  [phone]   [employer]  [phone]    Agencies that provide inexpensive medical care: Organization         Address  Phone   Notes  [place]  [phone]   [facility]    [phone]   [facility] [place], [place] [address] [phone]   [place] [address] [place] [phone]   Planned Parenthood    [phone]   [facility]    [phone]   [place] and [address], [place] Phone:  [phone], Fax:  [phone] Hours of Operation:  9 am - 6 pm, M-F.  Also accepts Medicaid/Medicare and self-pay.  [facility] for [place] [address], [address], [place] Phone:  [phone], Fax: [phone]. Hours of Operation:  8:30 am - 5:30 pm, M-F.  Also accepts Medicaid and self-pay.  [facility] [address] [place] Phone: [phone]   [place], [place], [place] [phone], Ext. [phone] Mondays & Thursdays: 7-9 AM.  First 15 patients are seen on a first come, first serve basis.    [place] Providers:  Organization         Address  Phone   Notes  [facility] [address], [place] [phone] Also accepts self-pay patients.  [facility] [address], [place]  [phone]   [place], [address], [place] [phone]   [facility] [address] [place] [phone]   Lucianne Lei [address], [place]   [phone] Only accepts [place] Access [place] patients after they have their name applied to their card.   Self-Pay (no insurance) in [facility]:  Organization         Address  Phone   Notes  Sickle Cell Patients, [facility] [place] [phone]   [facility] Care [place] [phone]   [facility] [place]  [place], [place], [place] [phone]   [facility]/Dr. Osei-Bonsu  [address], [place] or [address], [place] [phone] Phone number for both [place] and [place] locations is the same.  Urgent Medical and [facility] [address], [place] [phone]   [facility] [address], [place] or [address] [phone]   [facility] [address] [place] [phone], phone; [phone], fax Sees patients 1st and 3rd Saturday of every month.  Must not qualify for public  or private insurance (i.e. Medicaid, Medicare, [place] Health Choice, Veterans' Benefits)  Household income should be no more than 200% of the poverty level The clinic cannot treat you if you are pregnant or think you are pregnant  Sexually transmitted diseases are not treated at the clinic.    Dental Care: Organization         Address  Phone  Notes  [facility] Department of [facility] [place] [phone] Accepts children up to age 58 who are enrolled in [place] or [place]; pregnant women with a Medicaid card; and children who have applied for Medicaid or [place] Health Choice, but were declined, whose parents can pay a reduced fee at time of service.  [facility] Department of [facility]  [address], [place] [phone] Accepts children up to age 73 who are enrolled in [place] or [place]; pregnant women with a Medicaid card; and children who have applied for Medicaid or [place] Health Choice, but were declined, whose parents can pay a reduced fee at time of service.  [place] Adult Dental Access PROGRAM  [place] [phone] Patients are seen by appointment only. Walk-ins are not accepted. [place] will see patients 72 years of age and older. Monday - Tuesday (8am-5pm) Most Wednesdays (8:30-5pm) $30 per visit, cash only  [facility] Adult Dental Access PROGRAM  [address], [facility] [phone] Patients are seen by appointment only. Walk-ins are not accepted. [place] will see patients 42 years of age and older. One Wednesday Evening (Monthly: Volunteer Based).  $30 per visit, cash only  [place]  [phone] for adults; Children under age 37, call Graduate Pediatric Dentistry at [phone]. Children aged 95-14, please call [phone] to request a pediatric application.  Dental services are provided in all areas of  dental care including fillings, crowns and bridges, complete and partial dentures, implants, gum treatment, root canals, and extractions. Preventive care is also provided. Treatment is provided to both adults  and children. Patients are selected via a lottery and there is often a waiting list.   [facility] [address] [place]  [phone] [email or web address]   [facility] [address] [place], [place] [phone], Ext. [phone] Second and Fourth Thursday of each month, opens at 6:30 AM; Clinic ends at 9 AM.  Patients are seen on a first-come first-served basis, and a limited number are seen during each clinic.   [facility]  [address] [place], [place] [phone]   Eligibility Requirements You must have lived in [place], [place], or [place] counties for at least the last three months.   You cannot be eligible for state or federal sponsored [employer], including [employer], [place], or [employer].   You generally cannot be eligible for healthcare insurance through your employer.    How to apply: Eligibility screenings are held every Tuesday and Wednesday afternoon from 1:00 pm until 4:00 pm. You do not need an appointment for the interview!  [facility] [address] [place], [place]   [place]  [place] Department  [place]  [phone]    Behavioral Health Resources in the Community: Intensive Outpatient Programs Organization         Address  Phone  Notes  [place]. [address], [place], [place] [phone]   [facility]patient [address], [place], [place]   ADS: Alcohol & Drug Svcs [address] [place], [place]   [place] [address],  [place], [place] or  [phone]   Substance Abuse Resources Organization         Address  Phone  Notes  Alcohol and Drug Services  [phone]   [place]  [phone]   [facility] [place]   Chinita Pester  [phone]   Residential & Outpatient Substance Abuse Program  [phone]   Psychological Services Organization         Address  Phone  Notes  [facility] [place]  [place]  [phone]   [place] [address] [place] or [phone]    Mobile Crisis Teams Organization         Address  Phone  Notes  [facility], Mobile Crisis Care Unit  [phone]   [facility] Psychotherapeutic Services  [address] [place], [place]   [place] Levels [address] [place] [phone]    Self-Help/Support Groups Organization         Address  Phone             Notes  [place]. of [place] - variety of support groups  [place] Call for more information  Narcotics Anonymous (NA), [facility] [address], [employer] [place]  2 meetings at this location   Special educational needs teacher         Address  Phone  Notes  ASAP Residential Treatment [place],    [place]  [phone]   [facility]  [address], [place] [phone], [place], [place]   [place], [place] [phone] Admissions: 8am-3pm M-F  Incentives Substance [place] [address],    [place], [place] [phone]   [facility] [address], [place], [place] [phone]   [facility] [address]  [place], [place] [phone]   [facility] - Intensive Outpatient [address], [place], [place] [phone]   [facility] ([facility] [address]  [place], [place] [phone] or [phone]   Residential  Treatment Services (RTS) [address] [place], [place] [phone] Accepts Medicaid  [place] [address]  [place] [phone] Substance Abuse/Addiction Treatment   [facility] Behavioral Health Resources Organization         Address  Phone  Notes  [facility]  [phone]) [phone]   Angie FavaJulie Brannon, PhD [address] [place], [place]   [phone]) [phone] or [phone]) [phone]   [facility]   [address] [place], [place] [phone](336) [phone]   [facility] [address], [place], [place] [phone]) [phone] Insurance/Medicaid/sponsorship through [facility]  Faith and Families [address]                                    [place], [place] [phone](336) [phone] Therapy/tele-psych/case  [facility] [address]   [place], [place] [phone]) [phone]    Dr. Lolly MustacheArfeen  [phone]) [phone]   [facility] [place]  [employer] [facility] 1) [address], [place] 2) [address], [place] 3)  [address], [place] [phone]) [phone]) [phone]  [phone](336) [phone]   [facility] [place] Child Abuse Hotline [phone](336) [phone] or [phone](336) [phone] (After Hours)

## 2014-06-22 ENCOUNTER — Ambulatory Visit: Payer: Medicaid Other | Attending: Internal Medicine | Admitting: Internal Medicine

## 2014-06-22 ENCOUNTER — Encounter: Payer: Self-pay | Admitting: Internal Medicine

## 2014-06-22 VITALS — BP 137/88 | HR 80 | Temp 97.8°F | Resp 16 | Ht 65.0 in | Wt 290.0 lb

## 2014-06-22 DIAGNOSIS — R51 Headache: Secondary | ICD-10-CM | POA: Diagnosis not present

## 2014-06-22 DIAGNOSIS — H538 Other visual disturbances: Secondary | ICD-10-CM | POA: Insufficient documentation

## 2014-06-22 DIAGNOSIS — Z794 Long term (current) use of insulin: Secondary | ICD-10-CM | POA: Insufficient documentation

## 2014-06-22 DIAGNOSIS — E119 Type 2 diabetes mellitus without complications: Secondary | ICD-10-CM

## 2014-06-22 DIAGNOSIS — N529 Male erectile dysfunction, unspecified: Secondary | ICD-10-CM | POA: Diagnosis not present

## 2014-06-22 DIAGNOSIS — Z6841 Body Mass Index (BMI) 40.0 and over, adult: Secondary | ICD-10-CM | POA: Diagnosis not present

## 2014-06-22 LAB — POCT URINALYSIS DIPSTICK
Bilirubin, UA: NEGATIVE
Glucose, UA: 500
Ketones, UA: NEGATIVE
Leukocytes, UA: NEGATIVE
Nitrite, UA: NEGATIVE
Protein, UA: NEGATIVE
RBC UA: NEGATIVE
Spec Grav, UA: >=1.03
UROBILINOGEN UA: 0.2
pH, UA: 5.5

## 2014-06-22 LAB — LIPID PANEL
Cholesterol: 203 mg/dL — ABNORMAL HIGH (ref 0–200)
HDL: 42 mg/dL (ref >39)
LDL Cholesterol: 131 mg/dL — ABNORMAL HIGH (ref 0–99)
Total CHOL/HDL Ratio: 4.8 Ratio
Triglycerides: 152 mg/dL — ABNORMAL HIGH (ref <150)
VLDL: 30 mg/dL (ref 0–40)

## 2014-06-22 LAB — CBC
HEMATOCRIT: 48.1 % (ref 39.0–52.0)
HEMOGLOBIN: 16.2 g/dL (ref 13.0–17.0)
MCH: 29.6 pg (ref 26.0–34.0)
MCHC: 33.7 g/dL (ref 30.0–36.0)
MCV: 87.9 fL (ref 78.0–100.0)
MPV: 10.9 fL (ref 8.6–12.4)
Platelets: 237 10*3/uL (ref 150–400)
RBC: 5.47 MIL/uL (ref 4.22–5.81)
RDW: 13 % (ref 11.5–15.5)
WBC: 11.7 10*3/uL — ABNORMAL HIGH (ref 4.0–10.5)

## 2014-06-22 LAB — COMPLETE METABOLIC PANEL WITH GFR
ALBUMIN: 3.9 g/dL (ref 3.5–5.2)
ALT: 23 U/L (ref 0–53)
AST: 16 U/L (ref 0–37)
Alkaline Phosphatase: 73 U/L (ref 39–117)
BUN: 10 mg/dL (ref 6–23)
CALCIUM: 8.7 mg/dL (ref 8.4–10.5)
CHLORIDE: 101 meq/L (ref 96–112)
CO2: 29 mEq/L (ref 19–32)
Creat: 0.73 mg/dL (ref 0.50–1.35)
GLUCOSE: 257 mg/dL — AB (ref 70–99)
Potassium: 4.3 mEq/L (ref 3.5–5.3)
SODIUM: 139 meq/L (ref 135–145)
Total Bilirubin: 0.7 mg/dL (ref 0.2–1.2)
Total Protein: 7.1 g/dL (ref 6.0–8.3)

## 2014-06-22 LAB — GLUCOSE, POCT (MANUAL RESULT ENTRY): POC GLUCOSE: 270 mg/dL — AB (ref 70–99)

## 2014-06-22 LAB — POCT GLYCOSYLATED HEMOGLOBIN (HGB A1C): Hemoglobin A1C: 13.1

## 2014-06-22 MED ORDER — FREESTYLE LANCETS MISC: Status: DC

## 2014-06-22 MED ORDER — LIRAGLUTIDE 18 MG/3ML ~~LOC~~ SOPN: 0.6000 mg | PEN_INJECTOR | Freq: Every day | SUBCUTANEOUS | Status: DC

## 2014-06-22 MED ORDER — INSULIN PEN NEEDLE 31G X 6 MM MISC: Status: DC

## 2014-06-22 MED ORDER — GLUCOSE BLOOD VI STRP: ORAL_STRIP | Status: AC

## 2014-06-22 MED ORDER — INSULIN ASPART 100 UNIT/ML ~~LOC~~ SOLN
10.0000 [IU] | Freq: Once | SUBCUTANEOUS | Status: AC
  Administered 2014-06-22: 10 [IU] via SUBCUTANEOUS

## 2014-06-22 MED ORDER — METFORMIN HCL ER 500 MG PO TB24
500.0000 mg | ORAL_TABLET | Freq: Two times a day (BID) | ORAL | Status: DC
Start: 2014-06-22 — End: 2014-07-28

## 2014-06-22 MED ORDER — FREESTYLE SYSTEM KIT: 1.0000 | PACK | Status: DC | PRN

## 2014-06-22 NOTE — Patient Instructions (Signed)
Exercise to Lose Weight Exercise and a healthy diet may help you lose weight. Your doctor may suggest specific exercises. EXERCISE IDEAS AND TIPS  Choose low-cost things you enjoy doing, such as walking, bicycling, or exercising to workout videos.  Take stairs instead of the elevator.  Walk during your lunch break.  Park your car further away from work or school.  Go to a gym or an exercise class.  Start with 5 to 10 minutes of exercise each day. Build up to 30 minutes of exercise 4 to 6 days a week.  Wear shoes with good support and comfortable clothes.  Stretch before and after working out.  Work out until you breathe harder and your heart beats faster.  Drink extra water when you exercise.  Do not do so much that you hurt yourself, feel dizzy, or get very short of breath. Exercises that burn about 150 calories:  Running 1  miles in 15 minutes.  Playing volleyball for 45 to 60 minutes.  Washing and waxing a car for 45 to 60 minutes.  Playing touch football for 45 minutes.  Walking 1  miles in 35 minutes.  Pushing a stroller 1  miles in 30 minutes.  Playing basketball for 30 minutes.  Raking leaves for 30 minutes.  Bicycling 5 miles in 30 minutes.  Walking 2 miles in 30 minutes.  Dancing for 30 minutes.  Shoveling snow for 15 minutes.  Swimming laps for 20 minutes.  Walking up stairs for 15 minutes.  Bicycling 4 miles in 15 minutes.  Gardening for 30 to 45 minutes.  Jumping rope for 15 minutes.  Washing windows or floors for 45 to 60 minutes. Document Released: 06/07/2010 Document Revised: 07/28/2011 Document Reviewed: 06/07/2010 ExitCare Patient Information 2015 [employer]. This information is not intended to replace advice given to you by your health care provider. Make sure you discuss any questions you have with your health care provider.   Smoking Cessation Quitting smoking is important to your health and has many advantages. However,  it is not always easy to quit since nicotine is a very addictive drug. Oftentimes, people try 3 times or more before being able to quit. This document explains the best ways for you to prepare to quit smoking. Quitting takes hard work and a lot of effort, but you can do it. ADVANTAGES OF QUITTING SMOKING  You will live longer, feel better, and live better.  Your body will feel the impact of quitting smoking almost immediately.  Within 20 minutes, blood pressure decreases. Your pulse returns to its normal level.  After 8 hours, carbon monoxide levels in the blood return to normal. Your oxygen level increases.  After 24 hours, the chance of having a heart attack starts to decrease. Your breath, hair, and body stop smelling like smoke.  After 48 hours, damaged nerve endings begin to recover. Your sense of taste and smell improve.  After 72 hours, the body is virtually free of nicotine. Your bronchial tubes relax and breathing becomes easier.  After 2 to 12 weeks, lungs can hold more air. Exercise becomes easier and circulation improves.  The risk of having a heart attack, stroke, cancer, or lung disease is greatly reduced.  After 1 year, the risk of coronary heart disease is cut in half.  After 5 years, the risk of stroke falls to the same as a nonsmoker.  After 10 years, the risk of lung cancer is cut in half and the risk of other cancers decreases significantly.    After 15 years, the risk of coronary heart disease drops, usually to the level of a nonsmoker.  If you are pregnant, quitting smoking will improve your chances of having a healthy baby.  The people you live with, especially any children, will be healthier.  You will have extra money to spend on things other than cigarettes. QUESTIONS TO THINK ABOUT BEFORE ATTEMPTING TO QUIT You may want to talk about your answers with your health care provider.  Why do you want to quit?  If you tried to quit in the past, what helped and  what did not?  What will be the most difficult situations for you after you quit? How will you plan to handle them?  Who can help you through the tough times? Your family? Friends? A health care provider?  What pleasures do you get from smoking? What ways can you still get pleasure if you quit? Here are some questions to ask your health care provider:  How can you help me to be successful at quitting?  What medicine do you think would be best for me and how should I take it?  What should I do if I need more help?  What is smoking withdrawal like? How can I get information on withdrawal? GET READY  Set a quit date.  Change your environment by getting rid of all cigarettes, ashtrays, matches, and lighters in your home, car, or work. Do not let people smoke in your home.  Review your past attempts to quit. Think about what worked and what did not. GET SUPPORT AND ENCOURAGEMENT You have a better chance of being successful if you have help. You can get support in many ways.  Tell your family, friends, and coworkers that you are going to quit and need their support. Ask them not to smoke around you.  Get individual, group, or telephone counseling and support. Programs are available at local hospitals and health centers. Call your local health department for information about programs in your area.  Spiritual beliefs and practices may help some smokers quit.  Download a "quit meter" on your computer to keep track of quit statistics, such as how long you have gone without smoking, cigarettes not smoked, and money saved.  Get a self-help book about quitting smoking and staying off tobacco. LEARN NEW SKILLS AND BEHAVIORS  Distract yourself from urges to smoke. Talk to someone, go for a walk, or occupy your time with a task.  Change your normal routine. Take a different route to work. Drink tea instead of coffee. Eat breakfast in a different place.  Reduce your stress. Take a hot bath,  exercise, or read a book.  Plan something enjoyable to do every day. Reward yourself for not smoking.  Explore interactive web-based programs that specialize in helping you quit. GET MEDICINE AND USE IT CORRECTLY Medicines can help you stop smoking and decrease the urge to smoke. Combining medicine with the above behavioral methods and support can greatly increase your chances of successfully quitting smoking.  Nicotine replacement therapy helps deliver nicotine to your body without the negative effects and risks of smoking. Nicotine replacement therapy includes nicotine gum, lozenges, inhalers, nasal sprays, and skin patches. Some may be available over-the-counter and others require a prescription.  Antidepressant medicine helps people abstain from smoking, but how this works is unknown. This medicine is available by prescription.  Nicotinic receptor partial agonist medicine simulates the effect of nicotine in your brain. This medicine is available by prescription. Ask your   health care provider for advice about which medicines to use and how to use them based on your health history. Your health care provider will tell you what side effects to look out for if you choose to be on a medicine or therapy. Carefully read the information on the package. Do not use any other product containing nicotine while using a nicotine replacement product.  RELAPSE OR DIFFICULT SITUATIONS Most relapses occur within the first 3 months after quitting. Do not be discouraged if you start smoking again. Remember, most people try several times before finally quitting. You may have symptoms of withdrawal because your body is used to nicotine. You may crave cigarettes, be irritable, feel very hungry, cough often, get headaches, or have difficulty concentrating. The withdrawal symptoms are only temporary. They are strongest when you first quit, but they will go away within 10-14 days. To reduce the chances of relapse, try  to:  Avoid drinking alcohol. Drinking lowers your chances of successfully quitting.  Reduce the amount of caffeine you consume. Once you quit smoking, the amount of caffeine in your body increases and can give you symptoms, such as a rapid heartbeat, sweating, and anxiety.  Avoid smokers because they can make you want to smoke.  Do not let weight gain distract you. Many smokers will gain weight when they quit, usually less than 10 pounds. Eat a healthy diet and stay active. You can always lose the weight gained after you quit.  Find ways to improve your mood other than smoking. FOR MORE INFORMATION  www.smokefree.gov  Document Released: 04/29/2001 Document Revised: 09/19/2013 Document Reviewed: 08/14/2011 ExitCare Patient Information 2015 [employer]. This information is not intended to replace advice given to you by your health care provider. Make sure you discuss any questions you have with your health care provider.  

## 2014-06-22 NOTE — Progress Notes (Signed)
Patient ID: Derrick Gonzales, male   DOB: [birth year], 44 y.o.   MRN: [id number]  RKY:[id number]  [id number]:[id number]  DOB - [birth year]  CC:  Chief Complaint  Patient presents with  . Establish Care       HPI: Derrick Gonzales is a 44 y.o. male here today to establish medical care.  Patient has a history of diabetes mellitus (mixed type) and morbid obesity.  He reports that he used to be on Metformin and Glucotrol but has been out of his medications for one year.  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  He would like to know if victoza would be an option for him.  He reports blurred vision, neuropathy, headaches, and erectile dysfunction.    He c/o of pulling a muscle in his neck, which is causing pain especially when he turns his head.   Patient has No headache, No chest pain, No abdominal pain - No Nausea, No Cough - SOB.  No Known Allergies Past Medical History  Diagnosis Date  . Diabetes mellitus   . Morbid obesity with BMI of 45.0-49.9, adult 12/06/2011   Current Outpatient Prescriptions on File Prior to Visit  Medication Sig Dispense Refill  . diazepam (VALIUM) 10 MG tablet Take 10 mg by mouth every 12 (twelve) hours as needed. For anxiety    . glipiZIDE (GLUCOTROL) 10 MG tablet Take 10 mg by mouth 2 (two) times daily before a meal.    . hydrocortisone cream 1 % Apply to affected area 2 times daily 15 g 0  . metformin (FORTAMET) 1000 MG (OSM) 24 hr tablet Take 1,000 mg by mouth 2 (two) times daily with a meal.    . calamine lotion Apply 1 application topically as needed for itching.    . diphenhydrAMINE-zinc acetate (BENADRYL) cream Apply 1 application topically 3 (three) times daily as needed for itching.    . nitroGLYCERIN (NITROSTAT) 0.4 MG SL tablet Place 1 tablet (0.4 mg total) under the tongue every 5 (five) minutes as needed for chest pain. 25 tablet 1   No current facility-administered medications on  file prior to visit.   Family History  Problem Relation Age of Onset  . Coronary artery disease    . Coronary artery disease Father   . Heart disease Father   . Diabetes Father    History   Social History  . Marital Status: Single    Spouse Name: N/A    Number of Children: N/A  . Years of Education: N/A   Occupational History  . Not on file.   Social History Main Topics  . Smoking status: Former Research scientist (life sciences)  . Smokeless tobacco: Not on file  . Alcohol Use: No  . Drug Use: No  . Sexual Activity: Not on file   Other Topics Concern  . Not on file   Social History Narrative    Review of Systems: See HPI  Objective:   Filed Vitals:   06/22/14 1515  BP: 137/88  Pulse: 80  Temp: 97.8 F (36.6 C)  Resp: 16    Physical Exam: Constitutional: Patient appears well-developed and well-nourished. No distress. HENT: Normocephalic, atraumatic, External right and left ear normal. Oropharynx is clear and moist.  Eyes: Conjunctivae and EOM are normal. PERRLA, no scleral icterus. Neck: Normal ROM. Neck supple. No JVD. No tracheal deviation. No thyromegaly. CVS: RRR, S1/S2 +, no murmurs, no gallops, no carotid bruit.  Pulmonary: Effort and breath sounds normal, no stridor, rhonchi, wheezes, rales.  Abdominal: Soft. BS +, no distension, tenderness, rebound or guarding.  Musculoskeletal: Normal range of motion. No edema and no tenderness.  Lymphadenopathy: No lymphadenopathy noted, cervical Neuro: Alert. Normal reflexes, muscle tone coordination. No cranial nerve deficit. Skin: Skin is warm and dry. No rash noted. Not diaphoretic. No erythema. No pallor. Psychiatric: Normal mood and affect. Behavior, judgment, thought content normal.  Lab Results  Component Value Date   WBC 11.5* 12/06/2011   HGB 14.5 12/06/2011   HCT 42.3 12/06/2011   MCV 86.3 12/06/2011   PLT 219 12/06/2011   Lab Results  Component Value Date   CREATININE 0.68 12/06/2011   BUN 9 12/06/2011   NA 135  12/06/2011   K 3.8 12/06/2011   CL 97 12/06/2011   CO2 29 12/06/2011    Lab Results  Component Value Date   HGBA1C 13.10 06/22/2014   Lipid Panel     Component Value Date/Time   CHOL 176 12/06/2011 0330   TRIG 146 12/06/2011 0330   HDL 39* 12/06/2011 0330   CHOLHDL 4.5 12/06/2011 0330   VLDL 29 12/06/2011 0330   LDLCALC 108* 12/06/2011 0330       Assessment and plan:   [place] was seen today for establish care.  Diagnoses and all orders for this visit:  Type 2 diabetes mellitus without complication Orders: -     Glucose (CBG) -     HgB A1c -     insulin aspart (novoLOG) injection 10 Units; Inject 0.1 mLs (10 Units total) into the skin once. -     Begin metFORMIN (GLUCOPHAGE-XR) 500 MG 24 hr tablet; Take 1 tablet (500 mg total) by mouth 2 (two) times daily. -    Begin  Liraglutide 18 MG/3ML SOPN; Inject 0.1 mLs (0.6 mg total) into the skin daily. -     glucose monitoring kit (FREESTYLE) monitoring kit; 1 each by Does not apply route as needed. -     glucose blood test strip; Use as instructed -     Lancets (FREESTYLE) lancets; Use as instructed -     Insulin Pen Needle (1ST CHOICE PEN NEEDLES) 31G X 6 MM MISC; USE AS DIRECTED -     Lipid panel; Future -     CBC; Future -     COMPLETE METABOLIC PANEL WITH GFR; Future -     TSH; Future -     Lipid panel -     CBC -     COMPLETE METABOLIC PANEL WITH GFR -     TSH -     Glucose (CBG) -     Microalbumin, urine -     POCT urinalysis dipstick Patients diabetes remains uncontrolled as evidence by hemoglobin a1c >8.  Patient has been non-compliant with medication regimen. Stressed the multiple complications associated with uncontrolled diabetes.  Patient will stay on current medication dose and report back to clinic with cbg log in 2 weeks.  Morbid obesity with BMI of 45.0-49.9, adult Weight loss discussed at length and its complications to health.  Patient will loss 10 months by next visit in 3 months.  Diet and exercise  discussed as well as calorie intake.  Return in about 2 weeks (around 07/06/2014) for Nurse Visit-cbg log and 3 mo PCP. Will send to diabetes education once he gets hospital discount     Chari Manning, [facility] [phone] 06/22/2014, 3:55 PM

## 2014-06-22 NOTE — Progress Notes (Signed)
Pt is here to establish care. Pt has a history of diabetes. Pt reports pulling a muscle in his neck and he is having pain especially when he turns his head. Pt has been out of his medications for a year.

## 2014-06-23 LAB — TSH: TSH: 1.047 u[IU]/mL (ref 0.350–4.500)

## 2014-06-23 LAB — MICROALBUMIN, URINE: Microalb, Ur: 1.1 mg/dL (ref <2.0)

## 2014-07-10 ENCOUNTER — Ambulatory Visit: Payer: Self-pay | Attending: Internal Medicine

## 2014-07-10 ENCOUNTER — Ambulatory Visit: Payer: Medicaid Other | Attending: Internal Medicine | Admitting: *Deleted

## 2014-07-10 VITALS — BP 135/82 | HR 82 | Temp 98.2°F | Resp 16

## 2014-07-10 DIAGNOSIS — Z794 Long term (current) use of insulin: Secondary | ICD-10-CM | POA: Insufficient documentation

## 2014-07-10 DIAGNOSIS — E139 Other specified diabetes mellitus without complications: Secondary | ICD-10-CM

## 2014-07-10 DIAGNOSIS — E1065 Type 1 diabetes mellitus with hyperglycemia: Secondary | ICD-10-CM | POA: Diagnosis present

## 2014-07-10 DIAGNOSIS — E119 Type 2 diabetes mellitus without complications: Secondary | ICD-10-CM

## 2014-07-10 DIAGNOSIS — R739 Hyperglycemia, unspecified: Secondary | ICD-10-CM

## 2014-07-10 LAB — GLUCOSE, POCT (MANUAL RESULT ENTRY)
POC GLUCOSE: 293 mg/dL — AB (ref 70–99)
POC Glucose: 272 mg/dl — AB (ref 70–99)

## 2014-07-10 MED ORDER — INSULIN ASPART 100 UNIT/ML ~~LOC~~ SOLN
10.0000 [IU] | Freq: Once | SUBCUTANEOUS | Status: AC
  Administered 2014-07-10: 10 [IU] via SUBCUTANEOUS

## 2014-07-10 MED ORDER — LIRAGLUTIDE 18 MG/3ML ~~LOC~~ SOPN: 1.2000 mg | PEN_INJECTOR | Freq: Every day | SUBCUTANEOUS | Status: DC

## 2014-07-10 MED ORDER — ROSUVASTATIN CALCIUM 10 MG PO TABS: 10.0000 mg | ORAL_TABLET | Freq: Every day | ORAL | Status: DC

## 2014-07-10 NOTE — Progress Notes (Signed)
Patient presents for CBG and record review Med list reviewed; patient reports taking all meds as directed Patient's AM fasting blood sugars ranging 173-308 Patient's PM blood sugars ranging 206-394 Reviewed labs from last OV. Patient will begin crestor 10 mg daily. [facility] Discussed importance of receiving influenza and pneumococcal vaccines. Patient declined both  CBG 293  10 units novolog insulin given SQ per protocol  BP 135/82 P 82 R 16 T  98.2 oral  Per PCP: Crestor 10 mg daily Increase Victoza to 1.2 mg daily  Return in 2 weeks for nurse visit for CBG and record review  Patient advised to call for med refills at least 7 days before running out so as not to go without.  Patient given literature on Fat and Cholesterol Control Diet, Diabetes and Food, Diabetes and Exercise,  And Basic Carb Counting

## 2014-07-10 NOTE — Patient Instructions (Signed)
Fat and Cholesterol Control Diet Fat and cholesterol levels in your blood and organs are influenced by your diet. High levels of fat and cholesterol may lead to diseases of the heart, small and large blood vessels, gallbladder, liver, and pancreas. CONTROLLING FAT AND CHOLESTEROL WITH DIET Although exercise and lifestyle factors are important, your diet is key. That is because certain foods are known to raise cholesterol and others to lower it. The goal is to balance foods for their effect on cholesterol and more importantly, to replace saturated and trans fat with other types of fat, such as monounsaturated fat, polyunsaturated fat, and omega-3 fatty acids. On average, a person should consume no more than 15 to 17 g of saturated fat daily. Saturated and trans fats are considered "bad" fats, and they will raise LDL cholesterol. Saturated fats are primarily found in animal products such as meats, butter, and cream. However, that does not mean you need to give up all your favorite foods. Today, there are good tasting, low-fat, low-cholesterol substitutes for most of the things you like to eat. Choose low-fat or nonfat alternatives. Choose round or loin cuts of red meat. These types of cuts are lowest in fat and cholesterol. Chicken (without the skin), fish, veal, and ground turkey breast are great choices. Eliminate fatty meats, such as hot dogs and salami. Even shellfish have little or no saturated fat. Have a 3 oz (85 g) portion when you eat lean meat, poultry, or fish. Trans fats are also called "partially hydrogenated oils." They are oils that have been scientifically manipulated so that they are solid at room temperature resulting in a longer shelf life and improved taste and texture of foods in which they are added. Trans fats are found in stick margarine, some tub margarines, cookies, crackers, and baked goods.  When baking and cooking, oils are a great substitute for butter. The monounsaturated oils are  especially beneficial since it is believed they lower LDL and raise HDL. The oils you should avoid entirely are saturated tropical oils, such as coconut and palm.  Remember to eat a lot from food groups that are naturally free of saturated and trans fat, including fish, fruit, vegetables, beans, grains (barley, rice, couscous, bulgur wheat), and pasta (without cream sauces).  IDENTIFYING FOODS THAT LOWER FAT AND CHOLESTEROL  Soluble fiber may lower your cholesterol. This type of fiber is found in fruits such as apples, vegetables such as broccoli, potatoes, and carrots, legumes such as beans, peas, and lentils, and grains such as barley. Foods fortified with plant sterols (phytosterol) may also lower cholesterol. You should eat at least 2 g per day of these foods for a cholesterol lowering effect.  Read package labels to identify low-saturated fats, trans fat free, and low-fat foods at the supermarket. Select cheeses that have only 2 to 3 g saturated fat per ounce. Use a heart-healthy tub margarine that is free of trans fats or partially hydrogenated oil. When buying baked goods (cookies, crackers), avoid partially hydrogenated oils. Breads and muffins should be made from whole grains (whole-wheat or whole oat flour, instead of "flour" or "enriched flour"). Buy non-creamy canned soups with reduced salt and no added fats.  FOOD PREPARATION TECHNIQUES  Never deep-fry. If you must fry, either stir-fry, which uses very little fat, or use non-stick cooking sprays. When possible, broil, bake, or roast meats, and steam vegetables. Instead of putting butter or margarine on vegetables, use lemon and herbs, applesauce, and cinnamon (for squash and sweet potatoes). Use nonfat   yogurt, salsa, and low-fat dressings for salads.  LOW-SATURATED FAT / LOW-FAT FOOD SUBSTITUTES Meats / Saturated Fat (g)  Avoid: Steak, marbled (3 oz/85 g) / 11 g  Choose: Steak, lean (3 oz/85 g) / 4 g  Avoid: Hamburger (3 oz/85 g) / 7  g  Choose: Hamburger, lean (3 oz/85 g) / 5 g  Avoid: Ham (3 oz/85 g) / 6 g  Choose: Ham, lean cut (3 oz/85 g) / 2.4 g  Avoid: Chicken, with skin, dark meat (3 oz/85 g) / 4 g  Choose: Chicken, skin removed, dark meat (3 oz/85 g) / 2 g  Avoid: Chicken, with skin, light meat (3 oz/85 g) / 2.5 g  Choose: Chicken, skin removed, light meat (3 oz/85 g) / 1 g Dairy / Saturated Fat (g)  Avoid: Whole milk (1 cup) / 5 g  Choose: Low-fat milk, 2% (1 cup) / 3 g  Choose: Low-fat milk, 1% (1 cup) / 1.5 g  Choose: Skim milk (1 cup) / 0.3 g  Avoid: Hard cheese (1 oz/28 g) / 6 g  Choose: Skim milk cheese (1 oz/28 g) / 2 to 3 g  Avoid: Cottage cheese, 4% fat (1 cup) / 6.5 g  Choose: Low-fat cottage cheese, 1% fat (1 cup) / 1.5 g  Avoid: Ice cream (1 cup) / 9 g  Choose: Sherbet (1 cup) / 2.5 g  Choose: Nonfat frozen yogurt (1 cup) / 0.3 g  Choose: Frozen fruit bar / trace  Avoid: Whipped cream (1 tbs) / 3.5 g  Choose: Nondairy whipped topping (1 tbs) / 1 g Condiments / Saturated Fat (g)  Avoid: Mayonnaise (1 tbs) / 2 g  Choose: Low-fat mayonnaise (1 tbs) / 1 g  Avoid: Butter (1 tbs) / 7 g  Choose: Extra light margarine (1 tbs) / 1 g  Avoid: Coconut oil (1 tbs) / 11.8 g  Choose: Olive oil (1 tbs) / 1.8 g  Choose: Corn oil (1 tbs) / 1.7 g  Choose: Safflower oil (1 tbs) / 1.2 g  Choose: Sunflower oil (1 tbs) / 1.4 g  Choose: Soybean oil (1 tbs) / 2.4 g  Choose: Canola oil (1 tbs) / 1 g Document Released: 05/05/2005 Document Revised: 08/30/2012 Document Reviewed: 08/03/2013 [employer] Patient Information 2015 [place], [place]. This information is not intended to replace advice given to you by your health care provider. Make sure you discuss any questions you have with your health care provider. Diabetes Mellitus and Food It is important for you to manage your blood sugar (glucose) level. Your blood glucose level can be greatly affected by what you eat. Eating healthier  foods in the appropriate amounts throughout the day at about the same time each day will help you control your blood glucose level. It can also help slow or prevent worsening of your diabetes mellitus. Healthy eating may even help you improve the level of your blood pressure and reach or maintain a healthy weight.  HOW CAN FOOD AFFECT ME? Carbohydrates Carbohydrates affect your blood glucose level more than any other type of food. Your dietitian will help you determine how many carbohydrates to eat at each meal and teach you how to count carbohydrates. Counting carbohydrates is important to keep your blood glucose at a healthy level, especially if you are using insulin or taking certain medicines for diabetes mellitus. Alcohol Alcohol can cause sudden decreases in blood glucose (hypoglycemia), especially if you use insulin or take certain medicines for diabetes mellitus. Hypoglycemia can be a life-threatening  condition. Symptoms of hypoglycemia (sleepiness, dizziness, and disorientation) are similar to symptoms of having too much alcohol.  If your health care provider has given you approval to drink alcohol, do so in moderation and use the following guidelines:  Women should not have more than one drink per day, and men should not have more than two drinks per day. One drink is equal to:  12 oz of beer.  5 oz of wine.  1 oz of hard liquor.  Do not drink on an empty stomach.  Keep yourself hydrated. Have water, diet soda, or unsweetened iced tea.  Regular soda, juice, and other mixers might contain a lot of carbohydrates and should be counted. WHAT FOODS ARE NOT RECOMMENDED? As you make food choices, it is important to remember that all foods are not the same. Some foods have fewer nutrients per serving than other foods, even though they might have the same number of calories or carbohydrates. It is difficult to get your body what it needs when you eat foods with fewer nutrients. Examples of  foods that you should avoid that are high in calories and carbohydrates but low in nutrients include:  Trans fats (most processed foods list trans fats on the Nutrition Facts label).  Regular soda.  Juice.  Candy.  Sweets, such as cake, pie, doughnuts, and cookies.  Fried foods. WHAT FOODS CAN I EAT? Have nutrient-rich foods, which will nourish your body and keep you healthy. The food you should eat also will depend on several factors, including:  The calories you need.  The medicines you take.  Your weight.  Your blood glucose level.  Your blood pressure level.  Your cholesterol level. You also should eat a variety of foods, including:  Protein, such as meat, poultry, fish, tofu, nuts, and seeds (lean animal proteins are best).  Fruits.  Vegetables.  Dairy products, such as milk, cheese, and yogurt (low fat is best).  Breads, grains, pasta, cereal, rice, and beans.  Fats such as olive oil, trans fat-free margarine, canola oil, avocado, and olives. DOES EVERYONE WITH DIABETES MELLITUS HAVE THE SAME MEAL PLAN? Because every person with diabetes mellitus is different, there is not one meal plan that works for everyone. It is very important that you meet with a dietitian who will help you create a meal plan that is just right for you. Document Released: 01/30/2005 Document Revised: 05/10/2013 Document Reviewed: 04/01/2013 [facility] Patient Information 2015 [place], [place]. This information is not intended to replace advice given to you by your health care provider. Make sure you discuss any questions you have with your health care provider. Diabetes and Exercise Exercising regularly is important. It is not just about losing weight. It has many health benefits, such as:  Improving your overall fitness, flexibility, and endurance.  Increasing your bone density.  Helping with weight control.  Decreasing your body fat.  Increasing your muscle strength.  Reducing  stress and tension.  Improving your overall health. People with diabetes who exercise gain additional benefits because exercise:  Reduces appetite.  Improves the body's use of blood sugar (glucose).  Helps lower or control blood glucose.  Decreases blood pressure.  Helps control blood lipids (such as cholesterol and triglycerides).  Improves the body's use of the hormone insulin by:  Increasing the body's insulin sensitivity.  Reducing the body's insulin needs.  Decreases the risk for heart disease because exercising:  Lowers cholesterol and triglycerides levels.  Increases the levels of good cholesterol (such as high-density lipoproteins [  HDL]) in the body.  Lowers blood glucose levels. YOUR ACTIVITY PLAN  Choose an activity that you enjoy and set realistic goals. Your health care provider or diabetes educator can help you make an activity plan that works for you. Exercise regularly as directed by your health care provider. This includes:  Performing resistance training twice a week such as push-ups, sit-ups, lifting weights, or using resistance bands.  Performing 150 minutes of cardio exercises each week such as walking, running, or playing sports.  Staying active and spending no more than 90 minutes at one time being inactive. Even short bursts of exercise are good for you. Three 10-minute sessions spread throughout the day are just as beneficial as a single 30-minute session. Some exercise ideas include:  Taking the dog for a walk.  Taking the stairs instead of the elevator.  Dancing to your favorite song.  Doing an exercise video.  Doing your favorite exercise with a friend. RECOMMENDATIONS FOR EXERCISING WITH TYPE 1 OR TYPE 2 DIABETES   Check your blood glucose before exercising. If blood glucose levels are greater than 240 mg/dL, check for urine ketones. Do not exercise if ketones are present.  Avoid injecting insulin into areas of the body that are going to  be exercised. For example, avoid injecting insulin into:  The arms when playing tennis.  The legs when jogging.  Keep a record of:  Food intake before and after you exercise.  Expected peak times of insulin action.  Blood glucose levels before and after you exercise.  The type and amount of exercise you have done.  Review your records with your health care provider. Your health care provider will help you to develop guidelines for adjusting food intake and insulin amounts before and after exercising.  If you take insulin or oral hypoglycemic agents, watch for signs and symptoms of hypoglycemia. They include:  Dizziness.  Shaking.  Sweating.  Chills.  Confusion.  Drink plenty of water while you exercise to prevent dehydration or heat stroke. Body water is lost during exercise and must be replaced.  Talk to your health care provider before starting an exercise program to make sure it is safe for you. Remember, almost any type of activity is better than none. Document Released: 07/26/2003 Document Revised: 09/19/2013 Document Reviewed: 10/12/2012 [facility] Patient Information 2015 [place], [place]. This information is not intended to replace advice given to you by your health care provider. Make sure you discuss any questions you have with your health care provider. Basic Carbohydrate Counting for Diabetes Mellitus Carbohydrate counting is a method for keeping track of the amount of carbohydrates you eat. Eating carbohydrates naturally increases the level of sugar (glucose) in your blood, so it is important for you to know the amount that is okay for you to have in every meal. Carbohydrate counting helps keep the level of glucose in your blood within normal limits. The amount of carbohydrates allowed is different for every person. A dietitian can help you calculate the amount that is right for you. Once you know the amount of carbohydrates you can have, you can count the carbohydrates  in the foods you want to eat. Carbohydrates are found in the following foods:  Grains, such as breads and cereals.  Dried beans and soy products.  Starchy vegetables, such as potatoes, peas, and corn.  Fruit and fruit juices.  Milk and yogurt.  Sweets and snack foods, such as cake, cookies, candy, chips, soft drinks, and fruit drinks. CARBOHYDRATE COUNTING There are  two ways to count the carbohydrates in your food. You can use either of the methods or a combination of both. Reading the "Nutrition Facts" on [place] The "Nutrition Facts" is an area that is included on the labels of almost all packaged food and beverages in the [place]. It includes the serving size of that food or beverage and information about the nutrients in each serving of the food, including the grams (g) of carbohydrate per serving.  Decide the number of servings of this food or beverage that you will be able to eat or drink. Multiply that number of servings by the number of grams of carbohydrate that is listed on the label for that serving. The total will be the amount of carbohydrates you will be having when you eat or drink this food or beverage. Learning Standard Serving Sizes of Food When you eat food that is not packaged or does not include "Nutrition Facts" on the label, you need to measure the servings in order to count the amount of carbohydrates.A serving of most carbohydrate-rich foods contains about 15 g of carbohydrates. The following list includes serving sizes of carbohydrate-rich foods that provide 15 g ofcarbohydrate per serving:   1 slice of bread (1 oz) or 1 six-inch tortilla.    of a hamburger bun or English muffin.  4-6 crackers.   cup unsweetened dry cereal.    cup hot cereal.   cup rice or pasta.    cup mashed potatoes or  of a large baked potato.  1 cup fresh fruit or one small piece of fruit.    cup canned or frozen fruit or fruit juice.  1 cup milk.   cup  plain fat-free yogurt or yogurt sweetened with artificial sweeteners.   cup cooked dried beans or starchy vegetable, such as peas, corn, or potatoes.  Decide the number of standard-size servings that you will eat. Multiply that number of servings by 15 (the grams of carbohydrates in that serving). For example, if you eat 2 cups of strawberries, you will have eaten 2 servings and 30 g of carbohydrates (2 servings x 15 g = 30 g). For foods such as soups and casseroles, in which more than one food is mixed in, you will need to count the carbohydrates in each food that is included. EXAMPLE OF CARBOHYDRATE COUNTING Sample Dinner  3 oz chicken breast.   cup of [place] rice.   cup of corn.  1 cup milk.   1 cup strawberries with sugar-free whipped topping.  Carbohydrate Calculation Step 1: Identify the foods that contain carbohydrates:   Rice.   Corn.   Milk.   Strawberries. Step 2:Calculate the number of servings eaten of each:   2 servings of rice.   1 serving of corn.   1 serving of milk.   1 serving of strawberries. Step 3: Multiply each of those number of servings by 15 g:   2 servings of rice x 15 g = 30 g.   1 serving of corn x 15 g = 15 g.   1 serving of milk x 15 g = 15 g.   1 serving of strawberries x 15 g = 15 g. Step 4: Add together all of the amounts to find the total grams of carbohydrates eaten: 30 g + 15 g + 15 g + 15 g = 75 g. Document Released: 05/05/2005 Document Revised: 09/19/2013 Document Reviewed: 04/01/2013 [facility] Patient Information 2015 [place], [place]. This information is not intended to  replace advice given to you by your health care provider. Make sure you discuss any questions you have with your health care provider.  

## 2014-07-21 ENCOUNTER — Other Ambulatory Visit: Payer: Self-pay | Admitting: Internal Medicine

## 2014-07-21 DIAGNOSIS — E119 Type 2 diabetes mellitus without complications: Secondary | ICD-10-CM

## 2014-07-21 MED ORDER — LIRAGLUTIDE 18 MG/3ML ~~LOC~~ SOPN: 1.2000 mg | PEN_INJECTOR | Freq: Every day | SUBCUTANEOUS | Status: DC

## 2014-07-21 MED ORDER — ROSUVASTATIN CALCIUM 10 MG PO TABS: 10.0000 mg | ORAL_TABLET | Freq: Every day | ORAL | Status: DC

## 2014-07-21 MED ORDER — INSULIN PEN NEEDLE 31G X 6 MM MISC: Status: DC

## 2014-07-25 ENCOUNTER — Ambulatory Visit: Payer: Self-pay

## 2014-07-28 ENCOUNTER — Telehealth: Payer: Self-pay | Admitting: Internal Medicine

## 2014-07-28 ENCOUNTER — Ambulatory Visit: Payer: Medicaid Other | Attending: Internal Medicine | Admitting: Internal Medicine

## 2014-07-28 VITALS — BP 138/89 | HR 86 | Resp 16 | Ht 66.0 in | Wt 287.0 lb

## 2014-07-28 DIAGNOSIS — E139 Other specified diabetes mellitus without complications: Secondary | ICD-10-CM

## 2014-07-28 DIAGNOSIS — E119 Type 2 diabetes mellitus without complications: Secondary | ICD-10-CM | POA: Diagnosis not present

## 2014-07-28 LAB — GLUCOSE, POCT (MANUAL RESULT ENTRY): POC Glucose: 313 mg/dl — AB (ref 70–99)

## 2014-07-28 MED ORDER — METFORMIN HCL ER (MOD) 1000 MG PO TB24: 1000.0000 mg | ORAL_TABLET | Freq: Two times a day (BID) | ORAL | Status: DC

## 2014-07-28 MED ORDER — INSULIN ASPART 100 UNIT/ML ~~LOC~~ SOLN
10.0000 [IU] | Freq: Once | SUBCUTANEOUS | Status: AC
  Administered 2014-07-28: 10 [IU] via SUBCUTANEOUS

## 2014-07-28 NOTE — Progress Notes (Signed)
Pt comes in today for CBG/ log check Pt was seen 06/22/14 restarted on Metformin XR 500 mg tablet BID/ Liraglutide 0.1 ml injection Pt states he has started drinking bottled zero cal soda's with poor diet Pt has appt with Diabetes/Education next week According to log book, Cbg's fasting range in 200-300's Denies urine frequency/blurred vision CBG- 313, 10 units given per protocol  Orders per PCP Increase Metformin XR to 1000mg  BID Have pt return in 2 weeks if CBG's still high add Glipizide XL 5mg  daily Continue to record blood sugars and monitor diet with exercise

## 2014-07-28 NOTE — Patient Instructions (Signed)
Take 2 tablets Metformin XR twice a day until bottle runs out  New prescription 1000 mg tab will be at pharmacy. Return in 2 weeks for nurse visit, CBG log

## 2014-07-28 NOTE — Telephone Encounter (Signed)
Pt was prescribed a medication today called victosa but medicaid wont pay for it and he is wanting to speak with a nurse for an alternative medication or to see somehow if medicaid can approve it.

## 2014-08-09 ENCOUNTER — Ambulatory Visit: Payer: Medicaid Other | Admitting: *Deleted

## 2014-09-08 ENCOUNTER — Ambulatory Visit: Payer: Medicaid Other | Admitting: *Deleted

## 2014-09-15 ENCOUNTER — Ambulatory Visit: Payer: Medicaid Other | Attending: Internal Medicine | Admitting: Internal Medicine

## 2014-09-15 ENCOUNTER — Encounter: Payer: Self-pay | Admitting: Internal Medicine

## 2014-09-15 VITALS — BP 126/84 | HR 99 | Temp 99.4°F | Resp 16 | Ht 66.0 in | Wt 289.0 lb

## 2014-09-15 DIAGNOSIS — Z9111 Patient's noncompliance with dietary regimen: Secondary | ICD-10-CM | POA: Diagnosis not present

## 2014-09-15 DIAGNOSIS — E1165 Type 2 diabetes mellitus with hyperglycemia: Secondary | ICD-10-CM | POA: Diagnosis present

## 2014-09-15 DIAGNOSIS — R51 Headache: Secondary | ICD-10-CM | POA: Insufficient documentation

## 2014-09-15 DIAGNOSIS — Z794 Long term (current) use of insulin: Secondary | ICD-10-CM | POA: Diagnosis not present

## 2014-09-15 DIAGNOSIS — E119 Type 2 diabetes mellitus without complications: Secondary | ICD-10-CM | POA: Insufficient documentation

## 2014-09-15 DIAGNOSIS — Z6841 Body Mass Index (BMI) 40.0 and over, adult: Secondary | ICD-10-CM | POA: Diagnosis not present

## 2014-09-15 DIAGNOSIS — E1065 Type 1 diabetes mellitus with hyperglycemia: Secondary | ICD-10-CM

## 2014-09-15 DIAGNOSIS — E663 Overweight: Secondary | ICD-10-CM | POA: Insufficient documentation

## 2014-09-15 DIAGNOSIS — Z9114 Patient's other noncompliance with medication regimen: Secondary | ICD-10-CM | POA: Diagnosis not present

## 2014-09-15 DIAGNOSIS — IMO0001 Reserved for inherently not codable concepts without codable children: Secondary | ICD-10-CM

## 2014-09-15 LAB — POCT GLYCOSYLATED HEMOGLOBIN (HGB A1C): Hemoglobin A1C: 11.8

## 2014-09-15 LAB — GLUCOSE, POCT (MANUAL RESULT ENTRY): POC Glucose: 295 mg/dl — AB (ref 70–99)

## 2014-09-15 MED ORDER — INSULIN GLARGINE 100 UNIT/ML SOLOSTAR PEN: 15.0000 [IU] | PEN_INJECTOR | Freq: Every day | SUBCUTANEOUS | Status: DC

## 2014-09-15 NOTE — Progress Notes (Signed)
Pt is here today following up on his diabetes. Pt states that he is having blurred vision.

## 2014-09-15 NOTE — Patient Instructions (Signed)
Diabetes Mellitus and Food It is important for you to manage your blood sugar (glucose) level. Your blood glucose level can be greatly affected by what you eat. Eating healthier foods in the appropriate amounts throughout the day at about the same time each day will help you control your blood glucose level. It can also help slow or prevent worsening of your diabetes mellitus. Healthy eating may even help you improve the level of your blood pressure and reach or maintain a healthy weight.  HOW CAN FOOD AFFECT ME? Carbohydrates Carbohydrates affect your blood glucose level more than any other type of food. Your dietitian will help you determine how many carbohydrates to eat at each meal and teach you how to count carbohydrates. Counting carbohydrates is important to keep your blood glucose at a healthy level, especially if you are using insulin or taking certain medicines for diabetes mellitus. Alcohol Alcohol can cause sudden decreases in blood glucose (hypoglycemia), especially if you use insulin or take certain medicines for diabetes mellitus. Hypoglycemia can be a life-threatening condition. Symptoms of hypoglycemia (sleepiness, dizziness, and disorientation) are similar to symptoms of having too much alcohol.  If your health care provider has given you approval to drink alcohol, do so in moderation and use the following guidelines:  Women should not have more than one drink per day, and men should not have more than two drinks per day. One drink is equal to:  12 oz of beer.  5 oz of wine.  1 oz of hard liquor.  Do not drink on an empty stomach.  Keep yourself hydrated. Have water, diet soda, or unsweetened iced tea.  Regular soda, juice, and other mixers might contain a lot of carbohydrates and should be counted. WHAT FOODS ARE NOT RECOMMENDED? As you make food choices, it is important to remember that all foods are not the same. Some foods have fewer nutrients per serving than other  foods, even though they might have the same number of calories or carbohydrates. It is difficult to get your body what it needs when you eat foods with fewer nutrients. Examples of foods that you should avoid that are high in calories and carbohydrates but low in nutrients include:  Trans fats (most processed foods list trans fats on the Nutrition Facts label).  Regular soda.  Juice.  Candy.  Sweets, such as cake, pie, doughnuts, and cookies.  Fried foods. WHAT FOODS CAN I EAT? Have nutrient-rich foods, which will nourish your body and keep you healthy. The food you should eat also will depend on several factors, including:  The calories you need.  The medicines you take.  Your weight.  Your blood glucose level.  Your blood pressure level.  Your cholesterol level. You also should eat a variety of foods, including:  Protein, such as meat, poultry, fish, tofu, nuts, and seeds (lean animal proteins are best).  Fruits.  Vegetables.  Dairy products, such as milk, cheese, and yogurt (low fat is best).  Breads, grains, pasta, cereal, rice, and beans.  Fats such as olive oil, trans fat-free margarine, canola oil, avocado, and olives. DOES EVERYONE WITH DIABETES MELLITUS HAVE THE SAME MEAL PLAN? Because every person with diabetes mellitus is different, there is not one meal plan that works for everyone. It is very important that you meet with a dietitian who will help you create a meal plan that is just right for you. Document Released: 01/30/2005 Document Revised: 05/10/2013 Document Reviewed: 04/01/2013 [employer] Patient Information 2015 [employer]. This   information is not intended to replace advice given to you by your health care provider. Make sure you discuss any questions you have with your health care provider.  

## 2014-09-15 NOTE — Progress Notes (Signed)
Patient ID: Derrick Gonzales, male   DOB: 1970-09-12, 44 y.o.   MRN: [id number] SUBJECTIVE: 44 y.o. male for follow up of diabetes. Diabetic Review of Systems - medication compliance: noncompliant some of the time, diabetic diet compliance: noncompliant some of the time, further diabetic ROS: no polyuria or polydipsia, no chest pain, dyspnea or TIA's, no hypoglycemia, no medication side effects noted, has dysesthesias in the feet.  Other symptoms and concerns: left side of face hurts causing headaches. 181-300 usually. States that he has not been to diabetes education corses and he has not changed his diet any.   Current Outpatient Prescriptions  Medication Sig Dispense Refill  . glucose blood test strip Use as instructed 100 each 12  . glucose monitoring kit (FREESTYLE) monitoring kit 1 each by Does not apply route as needed. 1 each 0  . Insulin Pen Needle 31G X 6 MM MISC Test as directed 100 each 3  . Lancets (FREESTYLE) lancets Use as instructed 100 each 12  . metFORMIN (GLUMETZA) 1000 MG (MOD) 24 hr tablet Take 1 tablet (1,000 mg total) by mouth 2 (two) times daily. 180 tablet 3  . rosuvastatin (CRESTOR) 10 MG tablet Take 1 tablet (10 mg total) by mouth daily. 90 tablet 3  . calamine lotion Apply 1 application topically as needed for itching.    . diazepam (VALIUM) 10 MG tablet Take 10 mg by mouth every 12 (twelve) hours as needed. For anxiety    . diphenhydrAMINE-zinc acetate (BENADRYL) cream Apply 1 application topically 3 (three) times daily as needed for itching.    . hydrocortisone cream 1 % Apply to affected area 2 times daily (Patient not taking: Reported on 09/15/2014) 15 g 0  . Liraglutide 18 MG/3ML SOPN Inject 0.2 mLs (1.2 mg total) into the skin daily. (Patient not taking: Reported on 09/15/2014) 18 mL 3  . nitroGLYCERIN (NITROSTAT) 0.4 MG SL tablet Place 1 tablet (0.4 mg total) under the tongue every 5 (five) minutes as needed for chest pain. 25 tablet 1   No current  facility-administered medications for this visit.    OBJECTIVE: Appearance: alert, well appearing, and in no distress, oriented to person, place, and time and overweight. BP 126/84 mmHg  Pulse 99  Temp(Src) 99.4 F (37.4 C) (Oral)  Resp 16  Ht '5\' 6"'  (1.676 m)  Wt 289 lb (131.09 kg)  BMI 46.67 kg/m2  SpO2 94%  Exam: heart sounds normal rate, regular rhythm, normal S1, S2, no murmurs, rubs, clicks or gallops, no JVD, chest clear, no carotid bruits  ASSESSMENT: Diabetes Mellitus: poorly controlled and needs to follow diet more regularly  PLAN: See orders for this visit as documented in the electronic medical record. Issues reviewed with him: low cholesterol diet, weight control and daily exercise discussed, foot care discussed and Podiatry visits discussed, annual eye examinations at Ophthalmology discussed and long term diabetic complications discussed. I have advised patient to call and make appt for diabetes education since he no showed for his last appointment.  >50% of visit was spent counseling on complications of diabetes and compliance   Chari Manning, NP 10/08/2014 10:45 PM

## 2014-10-03 LAB — HM DIABETES EYE EXAM

## 2014-10-17 ENCOUNTER — Encounter: Payer: Self-pay | Admitting: Internal Medicine

## 2015-01-07 ENCOUNTER — Emergency Department
Admission: EM | Admit: 2015-01-07 | Discharge: 2015-01-07 | Disposition: A | Discharge status: Home / Self Care | Payer: Medicaid Other | Attending: Emergency Medicine | Admitting: Emergency Medicine

## 2015-01-07 ENCOUNTER — Encounter: Payer: Self-pay | Admitting: Emergency Medicine

## 2015-01-07 DIAGNOSIS — M5442 Lumbago with sciatica, left side: Secondary | ICD-10-CM | POA: Insufficient documentation

## 2015-01-07 DIAGNOSIS — Z87891 Personal history of nicotine dependence: Secondary | ICD-10-CM | POA: Diagnosis not present

## 2015-01-07 DIAGNOSIS — Z794 Long term (current) use of insulin: Secondary | ICD-10-CM | POA: Insufficient documentation

## 2015-01-07 DIAGNOSIS — M545 Low back pain: Secondary | ICD-10-CM | POA: Diagnosis present

## 2015-01-07 DIAGNOSIS — E119 Type 2 diabetes mellitus without complications: Secondary | ICD-10-CM | POA: Insufficient documentation

## 2015-01-07 DIAGNOSIS — Z79899 Other long term (current) drug therapy: Secondary | ICD-10-CM | POA: Insufficient documentation

## 2015-01-07 MED ORDER — ETODOLAC 400 MG PO TABS: 400.0000 mg | ORAL_TABLET | Freq: Two times a day (BID) | ORAL | Status: DC

## 2015-01-07 MED ORDER — KETOROLAC TROMETHAMINE 60 MG/2ML IM SOLN
60.0000 mg | Freq: Once | INTRAMUSCULAR | Status: AC
  Administered 2015-01-07: 60 mg via INTRAMUSCULAR
  Filled 2015-01-07: qty 2

## 2015-01-07 NOTE — ED Provider Notes (Signed)
[facility] Emergency Department Provider Note ____________________________________________  Time seen: Approximately 12:00 PM  I have reviewed the triage vital signs and the nursing notes.   HISTORY  Chief Complaint Back Pain   HPI Derrick Gonzales is a 44 y.o. male is here complaining of shooting low back pain that radiates down into his left leg for 2 days. He  denies any previous back problems.Patient states he is taken ibuprofen, used icy hot over-the-counter without any relief. Patient states that standing or moving his leg is extremely aggravating. Lying down gives him minimal relief. He denies any urinary or bladder incontinence. He states his pain is possibly 7 out of 10.   Past Medical History  Diagnosis Date  . Diabetes mellitus   . Morbid obesity with BMI of 45.0-49.9, adult 12/06/2011    Patient Active Problem List   Diagnosis Date Noted  . Diabetes mellitus, insulin dependent (IDDM), uncontrolled 09/15/2014  . Morbid obesity with BMI of 45.0-49.9, adult 12/06/2011  . Chest pain on exertion 12/06/2011    History reviewed. No pertinent past surgical history.  Current Outpatient Rx  Name  Route  Sig  Dispense  Refill  . calamine lotion   Topical   Apply 1 application topically as needed for itching.         . diazepam (VALIUM) 10 MG tablet   Oral   Take 10 mg by mouth every 12 (twelve) hours as needed. For anxiety         . diphenhydrAMINE-zinc acetate (BENADRYL) cream   Topical   Apply 1 application topically 3 (three) times daily as needed for itching.         . etodolac (LODINE) 400 MG tablet   Oral   Take 1 tablet (400 mg total) by mouth 2 (two) times daily.   14 tablet   0   . glucose blood test strip      Use as instructed   100 each   12   . glucose monitoring kit (FREESTYLE) monitoring kit   Does not apply   1 each by Does not apply route as needed.   1 each   0   . hydrocortisone cream 1 %      Apply  to affected area 2 times daily Patient not taking: Reported on 09/15/2014   15 g   0   . Insulin Glargine (LANTUS SOLOSTAR) 100 UNIT/ML Solostar Pen   Subcutaneous   Inject 15 Units into the skin daily at 10 pm.   5 pen   5   . Insulin Pen Needle 31G X 6 MM MISC      Test as directed   100 each   3   . Lancets (FREESTYLE) lancets      Use as instructed   100 each   12   . metFORMIN (GLUMETZA) 1000 MG (MOD) 24 hr tablet   Oral   Take 1 tablet (1,000 mg total) by mouth 2 (two) times daily.   180 tablet   3   . EXPIRED: nitroGLYCERIN (NITROSTAT) 0.4 MG SL tablet   Sublingual   Place 1 tablet (0.4 mg total) under the tongue every 5 (five) minutes as needed for chest pain.   25 tablet   1   . rosuvastatin (CRESTOR) 10 MG tablet   Oral   Take 1 tablet (10 mg total) by mouth daily.   90 tablet   3     Allergies Atorvastatin  Family History  Problem Relation Age of Onset  . Coronary artery disease    . Coronary artery disease Father   . Heart disease Father   . Diabetes Father   . Hypertension Mother     Social History Social History  Substance Use Topics  . Smoking status: Former Smoker    Quit date: 11/01/2011  . Smokeless tobacco: None     Comment: vaping with zero nicotine  . Alcohol Use: No    Review of Systems Constitutional: No fever/chills Cardiovascular: Denies chest pain. Respiratory: Denies shortness of breath. Gastrointestinal: No abdominal pain.  No nausea, no vomiting.  Genitourinary: Negative for dysuria. Musculoskeletal: Positive for back pain. Skin: Negative for rash. Neurological: Negative for headaches, or focal weakness. Positive radicular pain into left leg  10-point ROS otherwise negative.  ____________________________________________   PHYSICAL EXAM:  VITAL SIGNS: ED Triage Vitals  Enc Vitals Group     BP 01/07/15 1115 142/84 mmHg     Pulse Rate 01/07/15 1115 85     Resp --      Temp 01/07/15 1115 98.5 F (36.9 C)      Temp Source 01/07/15 1115 Oral     SpO2 01/07/15 1115 97 %     Weight 01/07/15 1115 290 lb (131.543 kg)     Height 01/07/15 1115 5' 6" (1.676 m)     Head Cir --      Peak Flow --      Pain Score --      Pain Loc --      Pain Edu? --      Excl. in [place]? --     Constitutional: Alert and oriented. Well appearing and in no acute distress. Eyes: Conjunctivae are normal. PERRL. EOMI. Head: Atraumatic. Nose: No congestion/rhinnorhea. Neck: No stridor.   Cardiovascular: Normal rate, regular rhythm. Grossly normal heart sounds.  Good peripheral circulation. Respiratory: Normal respiratory effort.  No retractions. Lungs CTAB. Gastrointestinal: Soft and nontender. No distention.No CVA tenderness. Musculoskeletal: Back exam no gross deformity. There is no tenderness on palpation of the lumbar spine. There is however moderate tenderness on palpation of the left SI joint and surrounding muscle tissue. Range of motion is restricted secondary to pain. Reflexes are 1+ bilaterally straight leg raises are 45 with discomfort in the left lower back bilaterally No lower extremity tenderness nor edema.  No joint effusions. Neurologic:  Normal speech and language. No gross focal neurologic deficits are appreciated. No gait instability. Skin:  Skin is warm, dry and intact. No rash noted. Psychiatric: Mood and affect are normal. Speech and behavior are normal.  ____________________________________________   LABS (all labs ordered are listed, but only abnormal results are displayed)  Labs Reviewed - No data to display _ PROCEDURES  Procedure(s) performed: None  Critical Care performed: No  ____________________________________________   INITIAL IMPRESSION / ASSESSMENT AND PLAN / ED COURSE  Pertinent labs & imaging results that were available during my care of the patient were reviewed by me and considered in my medical decision making (see chart for details).  Patient had moderate amount of  relief with Toradol injection. He was then able to lie on his left side without any discomfort. He is given a prescription for etodolac and he is to follow-up with his PCP or Dr. Roland Rack if any continued problems. ____________________________________________   FINAL CLINICAL IMPRESSION(S) / ED DIAGNOSES  Final diagnoses:  Left-sided low back pain with left-sided sciatica      Johnn Hai, PA-C 01/07/15 1431  Lennette Bihari  Paduchowski, MD 01/07/15 1526

## 2015-01-07 NOTE — ED Notes (Signed)
Pt presents from home with low shooting back pain that radiates to lower extremity that started two days ago. Pt states he went to bed and woke up unable to put pressure on leg. Pt has taken ibuprofen , icey hot without relief. Pt states standing on leg or moving it is aggravating and laying down gives some relief.6/10 pain.

## 2015-01-07 NOTE — Discharge Instructions (Signed)
Back Pain, Adult °Back pain is very common. The pain often gets better over time. The cause of back pain is usually not dangerous. Most people can learn to manage their back pain on their own.  °HOME CARE  °· Stay active. Start with short walks on flat ground if you can. Try to walk farther each day. °· Do not sit, drive, or stand in one place for more than 30 minutes. Do not stay in bed. °· Do not avoid exercise or work. Activity can help your back heal faster. °· Be careful when you bend or lift an object. Bend at your knees, keep the object close to you, and do not twist. °· Sleep on a firm mattress. Lie on your side, and bend your knees. If you lie on your back, put a pillow under your knees. °· Only take medicines as told by your doctor. °· Put ice on the injured area. °¨ Put ice in a plastic bag. °¨ Place a towel between your skin and the bag. °¨ Leave the ice on for 15-20 minutes, 03-04 times a day for the first 2 to 3 days. After that, you can switch between ice and heat packs. °· Ask your doctor about back exercises or massage. °· Avoid feeling anxious or stressed. Find good ways to deal with stress, such as exercise. °GET HELP RIGHT AWAY IF:  °· Your pain does not go away with rest or medicine. °· Your pain does not go away in 1 week. °· You have new problems. °· You do not feel well. °· The pain spreads into your legs. °· You cannot control when you poop (bowel movement) or pee (urinate). °· Your arms or legs feel weak or lose feeling (numbness). °· You feel sick to your stomach (nauseous) or throw up (vomit). °· You have belly (abdominal) pain. °· You feel like you may pass out (faint). °MAKE SURE YOU:  °· Understand these instructions. °· Will watch your condition. °· Will get help right away if you are not doing well or get worse. °Document Released: 10/22/2007 Document Revised: 07/28/2011 Document Reviewed: 09/06/2013 °[employer]® Patient Information ©2015 [employer]. This information is not intended  to replace advice given to you by your health care provider. Make sure you discuss any questions you have with your health care provider. ° °

## 2015-01-07 NOTE — ED Notes (Signed)
AAOx3.  Skin warm and dry.  Moving all extremities.  Ambulates with easy and steady gait.  Posture relaxed.

## 2015-08-02 ENCOUNTER — Emergency Department: Payer: Medicaid Other

## 2015-08-02 ENCOUNTER — Emergency Department
Admission: EM | Admit: 2015-08-02 | Discharge: 2015-08-02 | Disposition: A | Discharge status: Home / Self Care | Payer: Medicaid Other | Attending: Emergency Medicine | Admitting: Emergency Medicine

## 2015-08-02 ENCOUNTER — Encounter: Payer: Self-pay | Admitting: *Deleted

## 2015-08-02 DIAGNOSIS — R0789 Other chest pain: Secondary | ICD-10-CM | POA: Diagnosis present

## 2015-08-02 DIAGNOSIS — Z79899 Other long term (current) drug therapy: Secondary | ICD-10-CM | POA: Insufficient documentation

## 2015-08-02 DIAGNOSIS — R1011 Right upper quadrant pain: Secondary | ICD-10-CM | POA: Insufficient documentation

## 2015-08-02 DIAGNOSIS — Z794 Long term (current) use of insulin: Secondary | ICD-10-CM | POA: Diagnosis not present

## 2015-08-02 DIAGNOSIS — E119 Type 2 diabetes mellitus without complications: Secondary | ICD-10-CM | POA: Insufficient documentation

## 2015-08-02 DIAGNOSIS — Z6841 Body Mass Index (BMI) 40.0 and over, adult: Secondary | ICD-10-CM | POA: Diagnosis not present

## 2015-08-02 DIAGNOSIS — Z87891 Personal history of nicotine dependence: Secondary | ICD-10-CM | POA: Insufficient documentation

## 2015-08-02 DIAGNOSIS — Z7984 Long term (current) use of oral hypoglycemic drugs: Secondary | ICD-10-CM | POA: Insufficient documentation

## 2015-08-02 DIAGNOSIS — Z888 Allergy status to other drugs, medicaments and biological substances status: Secondary | ICD-10-CM | POA: Diagnosis not present

## 2015-08-02 LAB — HEPATIC FUNCTION PANEL
ALBUMIN: 3.4 g/dL — AB (ref 3.5–5.0)
ALT: 21 U/L (ref 17–63)
AST: 21 U/L (ref 15–41)
Alkaline Phosphatase: 61 U/L (ref 38–126)
BILIRUBIN TOTAL: 1 mg/dL (ref 0.3–1.2)
Bilirubin, Direct: 0.1 mg/dL (ref 0.1–0.5)
Indirect Bilirubin: 0.9 mg/dL (ref 0.3–0.9)
TOTAL PROTEIN: 6.8 g/dL (ref 6.5–8.1)

## 2015-08-02 LAB — CBC
HCT: 45.1 % (ref 40.0–52.0)
Hemoglobin: 15.4 g/dL (ref 13.0–18.0)
MCH: 29.6 pg (ref 26.0–34.0)
MCHC: 34.2 g/dL (ref 32.0–36.0)
MCV: 86.5 fL (ref 80.0–100.0)
PLATELETS: 197 10*3/uL (ref 150–440)
RBC: 5.21 MIL/uL (ref 4.40–5.90)
RDW: 12.8 % (ref 11.5–14.5)
WBC: 11.6 10*3/uL — AB (ref 3.8–10.6)

## 2015-08-02 LAB — BASIC METABOLIC PANEL
ANION GAP: 8 (ref 5–15)
BUN: 12 mg/dL (ref 6–20)
CHLORIDE: 102 mmol/L (ref 101–111)
CO2: 23 mmol/L (ref 22–32)
Calcium: 7.9 mg/dL — ABNORMAL LOW (ref 8.9–10.3)
Creatinine, Ser: 0.53 mg/dL — ABNORMAL LOW (ref 0.61–1.24)
Glucose, Bld: 353 mg/dL — ABNORMAL HIGH (ref 65–99)
POTASSIUM: 3.4 mmol/L — AB (ref 3.5–5.1)
SODIUM: 133 mmol/L — AB (ref 135–145)

## 2015-08-02 LAB — LIPASE, BLOOD: LIPASE: 21 U/L (ref 11–51)

## 2015-08-02 LAB — TROPONIN I

## 2015-08-02 LAB — GLUCOSE, CAPILLARY: Glucose-Capillary: 329 mg/dL — ABNORMAL HIGH (ref 65–99)

## 2015-08-02 MED ORDER — TRAMADOL HCL 50 MG PO TABS: 50.0000 mg | ORAL_TABLET | Freq: Four times a day (QID) | ORAL | Status: DC | PRN

## 2015-08-02 MED ORDER — PANTOPRAZOLE SODIUM 20 MG PO TBEC: 20.0000 mg | DELAYED_RELEASE_TABLET | Freq: Every day | ORAL | Status: DC

## 2015-08-02 MED ORDER — GI COCKTAIL ~~LOC~~
30.0000 mL | Freq: Once | ORAL | Status: AC
  Administered 2015-08-02: 30 mL via ORAL
  Filled 2015-08-02: qty 30

## 2015-08-02 NOTE — ED Provider Notes (Signed)
Time Seen: Approximately 1140  I have reviewed the triage notes  Chief Complaint: Chest Pain   History of Present Illness: Derrick Gonzales is a 45 y.o. male who presents with some persistent right upper quadrant right-sided lower chest discomfort which started yesterday. Ascribes some mild shortness of breath and occasional nausea. He states he has a lot of gas and belching. His pain started at 3 AM while he was at work. He states is been relatively constant since that time. He denies any lower abdominal pain. Denies any left-sided chest discomfort. He denies any fever, chills, productive cough. The records shows he does have some previous history of outpatient cardiac evaluations with negative stress echo, etc. back approximately 2013.   Past Medical History  Diagnosis Date  . Diabetes mellitus   . Morbid obesity with BMI of 45.0-49.9, adult ([place]) 12/06/2011    Patient Active Problem List   Diagnosis Date Noted  . Diabetes mellitus, insulin dependent (IDDM), uncontrolled ([place]) 09/15/2014  . Morbid obesity with BMI of 45.0-49.9, adult ([place]) 12/06/2011  . Chest pain on exertion 12/06/2011    History reviewed. No pertinent past surgical history.  History reviewed. No pertinent past surgical history.  Current Outpatient Rx  Name  Route  Sig  Dispense  Refill  . calamine lotion   Topical   Apply 1 application topically as needed for itching.         . diazepam (VALIUM) 10 MG tablet   Oral   Take 10 mg by mouth every 12 (twelve) hours as needed. For anxiety         . diphenhydrAMINE-zinc acetate (BENADRYL) cream   Topical   Apply 1 application topically 3 (three) times daily as needed for itching.         . etodolac (LODINE) 400 MG tablet   Oral   Take 1 tablet (400 mg total) by mouth 2 (two) times daily.   14 tablet   0   . glucose blood test strip      Use as instructed   100 each   12   . glucose monitoring kit (FREESTYLE) monitoring kit   Does not  apply   1 each by Does not apply route as needed.   1 each   0   . hydrocortisone cream 1 %      Apply to affected area 2 times daily Patient not taking: Reported on 09/15/2014   15 g   0   . Insulin Glargine (LANTUS SOLOSTAR) 100 UNIT/ML Solostar Pen   Subcutaneous   Inject 15 Units into the skin daily at 10 pm.   5 pen   5   . Insulin Pen Needle 31G X 6 MM MISC      Test as directed   100 each   3   . Lancets (FREESTYLE) lancets      Use as instructed   100 each   12   . metFORMIN (GLUMETZA) 1000 MG (MOD) 24 hr tablet   Oral   Take 1 tablet (1,000 mg total) by mouth 2 (two) times daily.   180 tablet   3   . EXPIRED: nitroGLYCERIN (NITROSTAT) 0.4 MG SL tablet   Sublingual   Place 1 tablet (0.4 mg total) under the tongue every 5 (five) minutes as needed for chest pain.   25 tablet   1   . rosuvastatin (CRESTOR) 10 MG tablet   Oral   Take 1 tablet (10 mg total) by mouth  daily.   90 tablet   3     Allergies:  Atorvastatin  Family History: Family History  Problem Relation Age of Onset  . Coronary artery disease    . Coronary artery disease Father   . Heart disease Father   . Diabetes Father   . Hypertension Mother     Social History: Social History  Substance Use Topics  . Smoking status: Former Smoker    Quit date: 11/01/2011  . Smokeless tobacco: None     Comment: vaping with zero nicotine  . Alcohol Use: No     Review of Systems:   10 point review of systems was performed and was otherwise negative:  Constitutional: No fever Eyes: No visual disturbances ENT: No sore throat, ear pain Cardiac: No chest pain Respiratory: No shortness of breath, wheezing, or stridor Abdomen: No abdominal pain, no vomiting, No diarrhea Endocrine: No weight loss, No night sweats Extremities: No peripheral edema, cyanosis Skin: No rashes, easy bruising Neurologic: No focal weakness, trouble with speech or swollowing Urologic: No dysuria, Hematuria, or  urinary frequency  Physical Exam:  ED Triage Vitals  Enc Vitals Group     BP 08/02/15 1011 134/79 mmHg     Pulse Rate 08/02/15 1011 93     Resp 08/02/15 1011 18     Temp 08/02/15 1011 98.3 F (36.8 C)     Temp Source 08/02/15 1011 Oral     SpO2 08/02/15 1011 95 %     Weight 08/02/15 1011 270 lb (122.471 kg)     Height 08/02/15 1011 5' 4" (1.626 m)     Head Cir --      Peak Flow --      Pain Score 08/02/15 1013 6     Pain Loc --      Pain Edu? --      Excl. in [place]? --     General: Awake , Alert , and Oriented times 3; GCS 15 Head: Normal cephalic , atraumatic Eyes: Pupils equal , round, reactive to light Nose/Throat: No nasal drainage, patent upper airway without erythema or exudate.  Neck: Supple, Full range of motion, No anterior adenopathy or palpable thyroid masses Lungs: Clear to ascultation without wheezes , rhonchi, or rales Heart: Regular rate, regular rhythm without murmurs , gallops , or rubs Abdomen: Patient has reproducible pain over the right upper quadrant without any rebound guarding rigidity. Bowel sounds are positive and symmetric in all 4 quadrants with some mild obesity.   Extremities: 2 plus symmetric pulses. No edema, clubbing or cyanosis Neurologic: normal ambulation, Motor symmetric without deficits, sensory intact Skin: warm, dry, no rashes   Labs:   All laboratory work was reviewed including any pertinent negatives or positives listed below:  Labs Reviewed  BASIC METABOLIC PANEL - Abnormal; Notable for the following:    Sodium 133 (*)    Potassium 3.4 (*)    Glucose, Bld 353 (*)    Creatinine, Ser 0.53 (*)    Calcium 7.9 (*)    All other components within normal limits  CBC - Abnormal; Notable for the following:    WBC 11.6 (*)    All other components within normal limits  GLUCOSE, CAPILLARY - Abnormal; Notable for the following:    Glucose-Capillary 329 (*)    All other components within normal limits  HEPATIC FUNCTION PANEL - Abnormal;  Notable for the following:    Albumin 3.4 (*)    All other components within normal limits  TROPONIN  I  LIPASE, BLOOD  CBG MONITORING, ED   reviewed the patient's laboratory work shows some hypoglycemia otherwise no significant findings  EKG:  ED ECG REPORT I, Daymon Larsen, the attending physician, personally viewed and interpreted this ECG.  Date: 08/02/2015 EKG Time: 1009 Rate 101 Rhythm: Sinus tachycardia QRS Axis: normal Intervals: normal ST/T Wave abnormalities: normal Conduction Disturbances: none Narrative Interpretation: unremarkable Sinus tachycardia otherwise normal EKG    Radiology:   CLINICAL DATA: Right upper quadrant pain.  EXAM: US ABDOMEN LIMITED - RIGHT UPPER QUADRANT  COMPARISON: None.  FINDINGS: Gallbladder:  No gallstones or wall thickening visualized. No sonographic Murphy sign noted by sonographer.  Common bile duct:  Diameter: 4 mm.  Liver:  Mild increased echogenicity in the liver with no focal masses.  IMPRESSION: Mild increased echogenicity in the liver with no focal masses. This likely represents hepatic steatosis seen on previous CT imaging from 2011. No acute abnormalities.   Electronically Signed By: Dorise Bullion III M.D On: 08/02/2015 13:00          DG Chest 2 View (Final result) Result time: 08/02/15 11:04:42   Final result by Rad Results In Interface (08/02/15 11:04:42)   Narrative:   CLINICAL DATA: Chest pain and shortness of breath for 2 days  EXAM: CHEST 2 VIEW  COMPARISON: 01/07/2014  FINDINGS: Cardiac shadow is within normal limits. The lungs are well aerated bilaterally. Chronic changes in the left ribcage are seen. No acute abnormality noted.  IMPRESSION: No acute abnormality noted.   Electronically Signed By: Inez Catalina M.D. On: 08/02/2015 11:04              I personally reviewed the radiologic studies   P  ED Course: Patient's stay was uneventful and  based on his clinical presentation I suspect this may be a gastrointestinal source. Patient had relief with a GI cocktail and he was advised to avoid nonsteroidal medications. Does not appear to have pancreatitis, hepatitis or any other significant abnormalities per laboratory work. With a normal EKG and negative enzymes with pain existent since 3 AM I felt we had adequately rule out any ischemic issue and I don't feel there is any signs of pulmonary embolism or aortic dissection at this time.    Assessment: Acute right upper quadrant abdominal pain   Final Clinical Impression:   Final diagnoses:  Right upper quadrant abdominal pain     Plan: * Outpatient management Patient was advised to return immediately if condition worsens. Patient was advised to follow up with their primary care physician or other specialized physicians involved in their outpatient care. The patient and/or family member/power of attorney had laboratory results reviewed at the bedside. All questions and concerns were addressed and appropriate discharge instructions were distributed by the nursing staff. Patient will be prescribed Ultram and proton next. Skin advised drink plenty of fluids and take Tylenol over-the-counter for pain            Daymon Larsen, MD 08/02/15 1426

## 2015-08-02 NOTE — ED Notes (Signed)
Pt complains of right sided chest pain radiating to back starting yesterday, pt complains of shortness of breath

## 2015-08-02 NOTE — ED Notes (Signed)
Patient transported to Ultrasound 

## 2016-02-26 DIAGNOSIS — K219 Gastro-esophageal reflux disease without esophagitis: Secondary | ICD-10-CM | POA: Diagnosis present

## 2016-02-26 DIAGNOSIS — E119 Type 2 diabetes mellitus without complications: Secondary | ICD-10-CM | POA: Insufficient documentation

## 2016-03-07 DIAGNOSIS — I10 Essential (primary) hypertension: Secondary | ICD-10-CM | POA: Insufficient documentation

## 2016-07-27 DIAGNOSIS — R42 Dizziness and giddiness: Secondary | ICD-10-CM | POA: Diagnosis not present

## 2017-04-20 DIAGNOSIS — J189 Pneumonia, unspecified organism: Secondary | ICD-10-CM | POA: Diagnosis not present

## 2017-07-21 ENCOUNTER — Encounter: Payer: Self-pay | Admitting: Family Medicine

## 2017-07-21 ENCOUNTER — Ambulatory Visit (INDEPENDENT_AMBULATORY_CARE_PROVIDER_SITE_OTHER): Payer: Self-pay | Admitting: Family Medicine

## 2017-07-21 ENCOUNTER — Other Ambulatory Visit: Payer: Self-pay

## 2017-07-21 ENCOUNTER — Encounter: Payer: Self-pay | Admitting: Licensed Clinical Social Worker

## 2017-07-21 VITALS — BP 142/82 | HR 86 | Temp 98.2°F | Ht 64.0 in | Wt 278.6 lb

## 2017-07-21 DIAGNOSIS — Z6841 Body Mass Index (BMI) 40.0 and over, adult: Secondary | ICD-10-CM

## 2017-07-21 DIAGNOSIS — N529 Male erectile dysfunction, unspecified: Secondary | ICD-10-CM

## 2017-07-21 DIAGNOSIS — E118 Type 2 diabetes mellitus with unspecified complications: Secondary | ICD-10-CM

## 2017-07-21 DIAGNOSIS — E1065 Type 1 diabetes mellitus with hyperglycemia: Secondary | ICD-10-CM

## 2017-07-21 LAB — POCT GLYCOSYLATED HEMOGLOBIN (HGB A1C): HEMOGLOBIN A1C: 12.4

## 2017-07-21 LAB — GLUCOSE, POCT (MANUAL RESULT ENTRY): POC GLUCOSE: 317 mg/dL — AB (ref 70–99)

## 2017-07-21 NOTE — Patient Instructions (Signed)
It was a pleasure to see you today! Thank you for choosing [facility] for your primary care. Derrick Gonzales was seen for establErnestene Gonzales care. Come back to the clinic if you have any new concerns, and go to the emergency room if you have any life threatening symptoms.  You should see our nutrition and pharmacy teams to help find a method to control your diabetes without metformin and insulin.  This will be difficult but you can do a lot with lifestyle changes.   You should also get an annual eye exam.  After your blood test comes back, I can consider ordering a medication called farxiga or jiardiance.  If we did any lab work today, and the results require attention, either me or my nurse will get in touch with you. If everything is normal, you will get a letter in mail and a message via . If you don't hear from Derrick Gonzales in two weeks, please give Derrick Gonzales a call. Otherwise, we look forward to seeing you again at your next visit. If you have any questions or concerns before then, please call the clinic at [phone].  Please bring all your medications to every doctors visit  Sign up for My Chart to have easy access to your labs results, and communication with your Primary care physician.    Please check-out at the front desk before leaving the clinic.    Best,  Dr. Marthenia RollingScott Melat Gonzales FAMILY MEDICINE RESIDENT - PGY1 07/21/2017 2:53 PM

## 2017-07-21 NOTE — Assessment & Plan Note (Addendum)
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  Pharm consult, nutrition consult, and jardiance 10mg 

## 2017-07-21 NOTE — Progress Notes (Signed)
Subjective:  Derrick Gonzales is a 47 y.o. male who presents to the [facility] today with a chief complaint of establishing care with particular focus on DM2.   HPI: Patient is a long term uncontrolled diabetic.  He has failed metformin due to gastric complaints and is not interested in trying different doses.  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   He would like to know if he can have invokana because he was on that at one point.   He endorses no eye/foot complications.  His weight has been a concern for awhile.  He knows he is obese and knows weight control is required for managing DM.  He has erectile dysfunction despite motivation for sexual activity.  He says he has problems both with initiating and maintaining erection.  This has been worse since his weight got out of control  Review of Systems  Constitutional: Negative.   HENT: Negative for congestion, ear pain and hearing loss.   Eyes: Negative.   Respiratory: Negative.   Cardiovascular: Negative for chest pain, orthopnea and leg swelling.  Gastrointestinal: Negative for abdominal pain, constipation and diarrhea.  Genitourinary: Negative.   Musculoskeletal: Negative for falls and neck pain.  Skin: Negative.   Neurological: Negative for dizziness, sensory change and headaches.  Psychiatric/Behavioral: Negative for depression and suicidal ideas.     Objective:  Physical Exam: BP (!) 142/82   Pulse 86   Temp 98.2 F (36.8 C) (Oral)   Ht 5\' 4"  (1.626 m)   Wt 278 lb 9.6 oz (126.4 kg)   SpO2 95%   BMI 47.82 kg/m   Gen: NAD, resting comfortably, obese CV: RRR with no murmurs appreciated Pulm: NWOB, CTAB with no crackles, wheezes, or rhonchi GI: Normal bowel sounds present. Soft, Nontender, Nondistended. MSK: no edema, cyanosis, or clubbing noted Skin: warm, dry Neuro: grossly normal, moves all extremities Psych: Normal affect and thought content  Results for orders placed or performed in  visit on 07/21/17 (from the past 72 hour(s))  HgB A1c     Status: Abnormal   Collection Time: 07/21/17  2:12 PM  Result Value Ref Range   Hemoglobin A1C 12.4   Glucose (CBG)     Status: Abnormal   Collection Time: 07/21/17  2:12 PM  Result Value Ref Range   POC Glucose 317 (A) 70 - 99 mg/dl  Basic Metabolic Panel     Status: Abnormal   Collection Time: 07/21/17  3:05 PM  Result Value Ref Range   Glucose 308 (H) 65 - 99 mg/dL   BUN 13 6 - 24 mg/dL   Creatinine, Ser 1.61 (L) 0.76 - 1.27 mg/dL   GFR calc non Af Amer 117 >59 mL/min/1.73   GFR calc Af Amer 135 >59 mL/min/1.73   BUN/Creatinine Ratio 20 9 - 20   Sodium 139 134 - 144 mmol/L   Potassium 4.2 3.5 - 5.2 mmol/L   Chloride 99 96 - 106 mmol/L   CO2 25 20 - 29 mmol/L   Calcium 8.8 8.7 - 10.2 mg/dL     Assessment/Plan:  Diabetes mellitus, insulin dependent (IDDM), uncontrolled (HCC) Morbid obesity and long term DM2, refuses metformin due to gastro concerns and 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  Pharm consult, nutrition consult, and jardiance 10mg   Morbid obesity with BMI of 45.0-49.9, adult Nutrition consult  Erectile dysfunction Still has significant interest but difficulty starting/maintaining erection  Plan is to attempt resolution with controlling  DM which is currently uncontrolled.   Marthenia RollingScott Evangelyn Crouse, DO FAMILY MEDICINE RESIDENT - PGY1 07/22/2017 8:03 PM

## 2017-07-21 NOTE — Progress Notes (Signed)
Type of Service: Clinical Social Work for [place] is a 47 y.o. male completed new patient packet and indicated concerns with food insecurities.  LCSW met with patient to assess needs. patient lives with son, ,recently started receiving food benefits from [employer].  Life changes: out of work Issues discussed: support system, Emergency planning/management officer,  Geographical information systems officer. Patient appreciative of information provided.  Casimer Lanius, LCSW Licensed Clinical Social Worker [facility]   [phone] 3:27 PM

## 2017-07-21 NOTE — Assessment & Plan Note (Signed)
Nutrition consult

## 2017-07-22 DIAGNOSIS — N529 Male erectile dysfunction, unspecified: Secondary | ICD-10-CM | POA: Insufficient documentation

## 2017-07-22 LAB — BASIC METABOLIC PANEL
BUN/Creatinine Ratio: 20 (ref 9–20)
BUN: 13 mg/dL (ref 6–24)
CHLORIDE: 99 mmol/L (ref 96–106)
CO2: 25 mmol/L (ref 20–29)
Calcium: 8.8 mg/dL (ref 8.7–10.2)
Creatinine, Ser: 0.66 mg/dL — ABNORMAL LOW (ref 0.76–1.27)
GFR calc non Af Amer: 117 mL/min/{1.73_m2} (ref >59)
GFR, EST AFRICAN AMERICAN: 135 mL/min/{1.73_m2} (ref >59)
GLUCOSE: 308 mg/dL — AB (ref 65–99)
Potassium: 4.2 mmol/L (ref 3.5–5.2)
SODIUM: 139 mmol/L (ref 134–144)

## 2017-07-22 MED ORDER — EMPAGLIFLOZIN 10 MG PO TABS: 10.0000 mg | ORAL_TABLET | Freq: Every day | ORAL | 0 refills | Status: DC

## 2017-07-22 NOTE — Assessment & Plan Note (Signed)
Still has significant interest but difficulty starting/maintaining erection  Plan is to attempt resolution with controlling DM which is currently uncontrolled.

## 2017-07-27 ENCOUNTER — Ambulatory Visit: Payer: Self-pay | Admitting: Pharmacist

## 2017-08-12 ENCOUNTER — Ambulatory Visit: Payer: Self-pay | Admitting: Registered"

## 2017-08-13 ENCOUNTER — Ambulatory Visit: Payer: Medicaid Other | Admitting: Family Medicine

## 2017-08-20 ENCOUNTER — Telehealth: Payer: Self-pay | Admitting: *Deleted

## 2017-08-20 MED ORDER — EMPAGLIFLOZIN 10 MG PO TABS: 10.0000 mg | ORAL_TABLET | Freq: Every day | ORAL | 0 refills | Status: DC

## 2017-08-20 NOTE — Telephone Encounter (Signed)
Pt walked in and stated that [facility] never received refill from 07/22/17.  Derrick Gonzales called to verify (had something to do with a new system with merging with [facility]).  Resent from problem list. Adelie Croswell, Maryjo RochesterJessica Dawn, CMA

## 2017-08-24 ENCOUNTER — Ambulatory Visit: Payer: Medicaid Other | Admitting: Family Medicine

## 2017-08-24 ENCOUNTER — Encounter: Payer: Self-pay | Admitting: Family Medicine

## 2017-08-24 ENCOUNTER — Other Ambulatory Visit: Payer: Self-pay

## 2017-08-24 VITALS — BP 152/80 | HR 93 | Temp 98.1°F | Wt 284.0 lb

## 2017-08-24 DIAGNOSIS — E10649 Type 1 diabetes mellitus with hypoglycemia without coma: Secondary | ICD-10-CM

## 2017-08-24 DIAGNOSIS — Z23 Encounter for immunization: Secondary | ICD-10-CM | POA: Diagnosis not present

## 2017-08-24 DIAGNOSIS — Z0000 Encounter for general adult medical examination without abnormal findings: Secondary | ICD-10-CM

## 2017-08-24 DIAGNOSIS — E118 Type 2 diabetes mellitus with unspecified complications: Secondary | ICD-10-CM

## 2017-08-24 DIAGNOSIS — Z6841 Body Mass Index (BMI) 40.0 and over, adult: Secondary | ICD-10-CM | POA: Diagnosis not present

## 2017-08-24 DIAGNOSIS — E7800 Pure hypercholesterolemia, unspecified: Secondary | ICD-10-CM

## 2017-08-24 LAB — POCT UA - MICROALBUMIN
CREATININE, POC: 50 mg/dL
MICROALBUMIN (UR) POC: 30 mg/L

## 2017-08-24 MED ORDER — METFORMIN HCL ER 500 MG PO TB24: 500.0000 mg | ORAL_TABLET | Freq: Every day | ORAL | 2 refills | Status: DC

## 2017-08-24 NOTE — Patient Instructions (Signed)
It was a pleasure to see you today! Thank you for choosing [facility] for your primary care. Derrick Gonzales was seen for diabetes followup and a work release form. Come back to the clinic if you have any new concerns, and go to the emergency room if you have any life threatening problems  Today we took some labs for hiv/kidney function/and cholesterol.  We also added metformin extended release to your prescriptions.  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   Please continue to find ways to improve your food intake and activity level.    If we did any lab work today, and the results require attention, either me or my nurse will get in touch with you. If everything is normal, you will get a letter in mail and a message via . If you don't hear from us in two weeks, please give us a call. Otherwise, we look forward to seeing you again at your next visit. If you have any questions or concerns before then, please call the clinic at [phone].  Please bring all your medications to every doctors visit  Sign up for My Chart to have easy access to your labs results, and communication with your Primary care physician.    Please check-out at the front desk before leaving the clinic.    Best,  Dr. Marthenia RollingScott Sireen Halk FAMILY MEDICINE RESIDENT - PGY1 08/24/2017 10:48 AM

## 2017-08-25 DIAGNOSIS — E7800 Pure hypercholesterolemia, unspecified: Secondary | ICD-10-CM | POA: Insufficient documentation

## 2017-08-25 LAB — LIPID PANEL
CHOL/HDL RATIO: 5.6 ratio — AB (ref 0.0–5.0)
Cholesterol, Total: 224 mg/dL — ABNORMAL HIGH (ref 100–199)
HDL: 40 mg/dL (ref >39)
LDL Calculated: 129 mg/dL — ABNORMAL HIGH (ref 0–99)
Triglycerides: 276 mg/dL — ABNORMAL HIGH (ref 0–149)
VLDL CHOLESTEROL CAL: 55 mg/dL — AB (ref 5–40)

## 2017-08-25 LAB — HIV ANTIBODY (ROUTINE TESTING W REFLEX): HIV Screen 4th Generation wRfx: NONREACTIVE

## 2017-08-25 NOTE — Assessment & Plan Note (Addendum)
Discussed further his nutrition goals and hopes to lose weight as a help with his DM  Will draw lipid panel

## 2017-08-25 NOTE — Assessment & Plan Note (Addendum)
Patient willing to try metformn XR 500, will continue jiardacne and attempt to improve diet.  Will order microalbumin, urine

## 2017-08-25 NOTE — Progress Notes (Signed)
Subjective:  Derrick Gonzales is a 47 y.o. male who presents to the [facility] today with a chief complaint of work note for work and DM checkup.   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  He has had no physical inabilities in past despite DM and obesity.  He thinks the physical activity will be good for him and he is willing to start metformin  He has been checking his CBGs and they have been lowering slightly on the jiardance but are still often over 200.  We discussed his concern for trying to lower them and to control his weight.  He is more accepting of starting some of the preventative measures and is accepting of HIV/tdap/urine microalbumin    Objective:  Physical Exam: BP (!) 152/80   Pulse 93   Temp 98.1 F (36.7 C) (Oral)   Wt 284 lb (128.8 kg)   SpO2 93%   BMI 48.75 kg/m   Gen: NAD, resting comfortably, obese CV: RRR with no murmurs appreciated Pulm: NWOB, CTAB with no crackles, wheezes, or rhonchi GI: Normal bowel sounds present. Soft, Nontender, Nondistended. MSK: no edema, cyanosis, or clubbing noted Skin: warm, dry Neuro: grossly normal, moves all extremities Psych: Normal affect and thought content  Results for orders placed or performed in visit on 08/24/17 (from the past 72 hour(s))  POCT UA - Microalbumin     Status: Abnormal   Collection Time: 08/24/17 10:14 AM  Result Value Ref Range   Microalbumin Ur, POC 30 mg/L   Creatinine, POC 50 mg/dL   Albumin/Creatinine Ratio, Urine, POC 30-300   HIV antibody (with reflex)     Status: None   Collection Time: 08/24/17 11:05 AM  Result Value Ref Range   HIV Screen 4th Generation wRfx Non Reactive Non Reactive  Lipid Panel     Status: Abnormal   Collection Time: 08/24/17 11:05 AM  Result Value Ref Range   Cholesterol, Total 224 (H) 100 - 199 mg/dL   Triglycerides 161 (H) 0 - 149 mg/dL   HDL 40 >09 mg/dL   VLDL Cholesterol Cal 55 (H) 5 -  40 mg/dL   LDL Calculated 604 (H) 0 - 99 mg/dL   Chol/HDL Ratio 5.6 (H) 0.0 - 5.0 ratio    Comment:                                   T. Chol/HDL Ratio                                             Men  Women                               1/2 Avg.Risk  3.4    3.3                                   Avg.Risk  5.0    4.4                                2X  Avg.Risk  9.6    7.1                                3X Avg.Risk 23.4   11.0      Assessment/Plan:  Morbid obesity with BMI of 45.0-49.9, adult Discussed further his nutrition goals and hopes to lose weight as a help with his DM  Will draw lipid panel  DM2 (diabetes mellitus, type 2) (HCC) Patient willing to try metformn XR 500, will continue jiardacne and attempt to improve diet.  Will order microalbumin, urine   Marthenia RollingScott Dalene Robards, DO FAMILY MEDICINE RESIDENT - PGY1 08/25/2017 7:07 PM

## 2017-09-08 ENCOUNTER — Emergency Department (HOSPITAL_COMMUNITY)
Admission: EM | Admit: 2017-09-08 | Discharge: 2017-09-08 | Disposition: A | Discharge status: Home / Self Care | Payer: Medicaid Other | Attending: Emergency Medicine | Admitting: Emergency Medicine

## 2017-09-08 DIAGNOSIS — Y990 Civilian activity done for income or pay: Secondary | ICD-10-CM | POA: Diagnosis not present

## 2017-09-08 DIAGNOSIS — E119 Type 2 diabetes mellitus without complications: Secondary | ICD-10-CM | POA: Insufficient documentation

## 2017-09-08 DIAGNOSIS — Z79899 Other long term (current) drug therapy: Secondary | ICD-10-CM | POA: Insufficient documentation

## 2017-09-08 DIAGNOSIS — S46811A Strain of other muscles, fascia and tendons at shoulder and upper arm level, right arm, initial encounter: Secondary | ICD-10-CM

## 2017-09-08 DIAGNOSIS — Z87891 Personal history of nicotine dependence: Secondary | ICD-10-CM | POA: Insufficient documentation

## 2017-09-08 DIAGNOSIS — X500XXA Overexertion from strenuous movement or load, initial encounter: Secondary | ICD-10-CM | POA: Insufficient documentation

## 2017-09-08 DIAGNOSIS — Y9389 Activity, other specified: Secondary | ICD-10-CM | POA: Diagnosis not present

## 2017-09-08 DIAGNOSIS — S299XXA Unspecified injury of thorax, initial encounter: Secondary | ICD-10-CM | POA: Diagnosis present

## 2017-09-08 DIAGNOSIS — Y9263 Factory as the place of occurrence of the external cause: Secondary | ICD-10-CM | POA: Insufficient documentation

## 2017-09-08 DIAGNOSIS — Z7984 Long term (current) use of oral hypoglycemic drugs: Secondary | ICD-10-CM | POA: Diagnosis not present

## 2017-09-08 DIAGNOSIS — R600 Localized edema: Secondary | ICD-10-CM | POA: Diagnosis not present

## 2017-09-08 MED ORDER — ACETAMINOPHEN 325 MG PO TABS
650.0000 mg | ORAL_TABLET | Freq: Once | ORAL | Status: AC
  Administered 2017-09-08: 650 mg via ORAL
  Filled 2017-09-08: qty 2

## 2017-09-08 NOTE — ED Triage Notes (Signed)
Patient started a new job at a [employer], picked up a a box full of chicken.  Patient did not know the box was full of chicken and it was 150 lbs.  When he picked up the box per patient words: "it felt and sounded like ripping cardboard".  Patient reports it was difficult to get out of bed and the pain is excruciating every time he moves, he cannot bend, cannot lift right arm over head.  He took 800 mg Ibuprofen last night to relief pain, the pain came back an hour later.

## 2017-09-08 NOTE — Discharge Instructions (Signed)
Symptoms likely from a muscle strain. Rest. Ice for the next 48-72 hours. Avoid any activities that exacerbate the pain. Take 1000 mg of acetaminophen (Tylenol) +600 mg of ibuprofen (Aleve, Advil) every 6-8 hours for the next 3-5 days. Start doing gentle, passive back stretches when pain has improved and if tolerable. Return slowly to full activities when there is minimal to no discomfort.Follow-up with orthopedist if pain does not improve in the next 3-5 days.

## 2017-09-08 NOTE — ED Provider Notes (Signed)
[facility] EMERGENCY DEPARTMENT Provider Note   CSN: [id number] Arrival date & time: 09/08/17  1640     History   Chief Complaint Chief Complaint  Patient presents with  . Back Pain    HPI Derrick Gonzales is a 47 y.o. male with history of obesity, diabetes is here for evaluation of sudden onset, sharp, intermittent right-sided thoracic back pain that began earlier today when he picked up 150 pound box while at work. States he did not know that it was full of packaged chicken, when he lifted he heard a "tearing sound" on the back of his thoracic spine. The pain is localized to the area under his scapula. Pain is moderate. Aggravating factors include palpation, movement of his arm. Alleviating factors include ibuprofen 800 mg that provided temporary relief of pain. No direct trauma, falls, pleuritic or exertional chest pain, shortness of breath, cough, fevers. He is right-hand dominant. No previous back injuries.  HPI  Past Medical History:  Diagnosis Date  . Chest pain on exertion 12/06/2011  . Diabetes mellitus   . Morbid obesity with BMI of 45.0-49.9, adult ([place]) 12/06/2011    Patient Active Problem List   Diagnosis Date Noted  . Hypercholesterolemia 08/25/2017  . Erectile dysfunction 07/22/2017  . DM2 (diabetes mellitus, type 2) ([place]) 09/15/2014  . Morbid obesity with BMI of 45.0-49.9, adult ([place]) 12/06/2011    No past surgical history on file.      Home Medications    Prior to Admission medications   Medication Sig Start Date End Date Taking? Authorizing Provider  empagliflozin (JARDIANCE) 10 MG TABS tablet Take 10 mg by mouth daily. 08/20/17   Lind Covert, MD  glucose blood test strip Use as instructed 06/22/14   Chari Manning A, NP  glucose monitoring kit (FREESTYLE) monitoring kit 1 each by Does not apply route as needed. 06/22/14   [name], NP  Lancets (FREESTYLE) lancets Use as instructed 06/22/14   [name], NP  metFORMIN  (GLUCOPHAGE XR) 500 MG 24 hr tablet Take 1 tablet (500 mg total) by mouth daily with breakfast. 08/24/17   Sherene Sires, DO  pantoprazole (PROTONIX) 20 MG tablet Take 1 tablet (20 mg total) by mouth daily. 08/02/15 08/01/16  Daymon Larsen, MD  rosuvastatin (CRESTOR) 10 MG tablet Take 1 tablet (10 mg total) by mouth daily. 07/21/14   Tresa Garter, MD    Family History Family History  Problem Relation Age of Onset  . Coronary artery disease Father   . Heart disease Father   . Diabetes Father   . Hypertension Mother   . Coronary artery disease Unknown     Social History Social History   Tobacco Use  . Smoking status: Former Smoker    Last attempt to quit: 11/01/2011    Years since quitting: 5.8  . Tobacco comment: vaping with zero nicotine  Substance Use Topics  . Alcohol use: No    Alcohol/week: 0.0 oz  . Drug use: No     Allergies   Atorvastatin   Review of Systems Review of Systems  Musculoskeletal: Positive for back pain and myalgias.  All other systems reviewed and are negative.    Physical Exam Updated Vital Signs BP 117/76 (BP Location: Right Arm)   Pulse 86   Temp 98.2 F (36.8 C) (Oral)   Resp 18   Ht '5\' 5"'  (1.651 m)   Wt 127 kg (280 lb)   SpO2 98%   BMI  46.59 kg/m   Physical Exam  Constitutional: He is oriented to person, place, and time. He appears well-developed and well-nourished.  Non-toxic appearance.  HENT:  Head: Normocephalic.  Right Ear: External ear normal.  Left Ear: External ear normal.  Nose: Nose normal.  Eyes: Conjunctivae and EOM are normal.  Neck: Full passive range of motion without pain.  Cardiovascular: Normal rate.  2+ radial and ulnar pulses bilaterally. Good cap refill to fingers. Fingers warm.  Pulmonary/Chest: Effort normal. No tachypnea. No respiratory distress.  Musculoskeletal: Normal range of motion. He exhibits edema and tenderness.  Cervical spine: No midline or paraspinal muscular tenderness, full passive  range of motion without pain Thoracic spine: No midline tenderness. Mild, right-sided muscular tenderness with mild edema. No obvious spasm. Tenderness to muscles below the scapula. Full passive range of motion of the right shoulder with mild discomfort. No overlying rash, ecchymosis. Lumbar spine: No midline or paraspinal muscular tenderness. Upper extremities: Nontender bony prominences of the shoulders. 5/5 strength with shoulder range of motion against resistance. 5/5 hand grip bilaterally.  Neurological: He is alert and oriented to person, place, and time.  5/5 hand grip bilaterally. 5/5 finger abduction and abduction bilaterally. Sensation to light touch in median, ulnar, radial nerve distribution intact bilaterally.  Skin: Skin is warm and dry. Capillary refill takes less than 2 seconds.  Psychiatric: His behavior is normal. Thought content normal.     ED Treatments / Results  Labs (all labs ordered are listed, but only abnormal results are displayed) Labs Reviewed - No data to display  EKG None  Radiology No results found.  Procedures Procedures (including critical care time)  Medications Ordered in ED Medications  acetaminophen (TYLENOL) tablet 650 mg (650 mg Oral Given 09/08/17 1822)     Initial Impression / Assessment and Plan / ED Course  I have reviewed the triage vital signs and the nursing notes.  Pertinent labs & imaging results that were available during my care of the patient were reviewed by me and considered in my medical decision making (see chart for details).     Differential diagnosis includes partial strain of trapezius/rhomboid/lat. There is no midline CTL spine tenderness. Affected extremity is neurovascularly intact. I don't think further emergent lab work or imaging is indicated at this time. We'll discharge with symptomatic management, work note and reevaluation in 7-10 days. Discussed return precautions. Patient verbalized understanding and  agreeable.  Final Clinical Impressions(s) / ED Diagnoses   Final diagnoses:  Strain of right trapezius muscle, initial encounter    ED Discharge Orders    None       Arlean Hopping 09/08/17 2009    Margette Fast, MD 09/09/17 [phone]

## 2017-09-17 ENCOUNTER — Ambulatory Visit: Payer: Medicaid Other | Admitting: Family Medicine

## 2017-09-18 ENCOUNTER — Other Ambulatory Visit: Payer: Self-pay | Admitting: Family Medicine

## 2017-10-16 DIAGNOSIS — M25572 Pain in left ankle and joints of left foot: Secondary | ICD-10-CM | POA: Diagnosis not present

## 2017-10-27 ENCOUNTER — Other Ambulatory Visit: Payer: Self-pay | Admitting: Family Medicine

## 2017-11-04 DIAGNOSIS — H40033 Anatomical narrow angle, bilateral: Secondary | ICD-10-CM | POA: Diagnosis not present

## 2017-11-04 DIAGNOSIS — H16223 Keratoconjunctivitis sicca, not specified as Sjogren's, bilateral: Secondary | ICD-10-CM | POA: Diagnosis not present

## 2017-11-26 DIAGNOSIS — H5213 Myopia, bilateral: Secondary | ICD-10-CM | POA: Diagnosis not present

## 2017-11-26 DIAGNOSIS — H1013 Acute atopic conjunctivitis, bilateral: Secondary | ICD-10-CM | POA: Diagnosis not present

## 2017-12-08 ENCOUNTER — Other Ambulatory Visit: Payer: Self-pay | Admitting: Family Medicine

## 2017-12-08 DIAGNOSIS — E118 Type 2 diabetes mellitus with unspecified complications: Secondary | ICD-10-CM

## 2018-01-04 DIAGNOSIS — Z7982 Long term (current) use of aspirin: Secondary | ICD-10-CM | POA: Diagnosis not present

## 2018-01-04 DIAGNOSIS — I288 Other diseases of pulmonary vessels: Secondary | ICD-10-CM | POA: Diagnosis not present

## 2018-01-04 DIAGNOSIS — I4510 Unspecified right bundle-branch block: Secondary | ICD-10-CM | POA: Diagnosis not present

## 2018-01-04 DIAGNOSIS — Z6841 Body Mass Index (BMI) 40.0 and over, adult: Secondary | ICD-10-CM | POA: Diagnosis not present

## 2018-01-04 DIAGNOSIS — Z79899 Other long term (current) drug therapy: Secondary | ICD-10-CM | POA: Diagnosis not present

## 2018-01-04 DIAGNOSIS — E1165 Type 2 diabetes mellitus with hyperglycemia: Secondary | ICD-10-CM | POA: Diagnosis not present

## 2018-01-04 DIAGNOSIS — R079 Chest pain, unspecified: Secondary | ICD-10-CM | POA: Diagnosis not present

## 2018-01-04 DIAGNOSIS — I252 Old myocardial infarction: Secondary | ICD-10-CM | POA: Diagnosis not present

## 2018-01-04 DIAGNOSIS — I2510 Atherosclerotic heart disease of native coronary artery without angina pectoris: Secondary | ICD-10-CM | POA: Diagnosis not present

## 2018-01-04 DIAGNOSIS — E785 Hyperlipidemia, unspecified: Secondary | ICD-10-CM | POA: Diagnosis not present

## 2018-01-04 DIAGNOSIS — R0789 Other chest pain: Secondary | ICD-10-CM | POA: Diagnosis not present

## 2018-01-04 DIAGNOSIS — Z87891 Personal history of nicotine dependence: Secondary | ICD-10-CM | POA: Diagnosis not present

## 2018-01-04 DIAGNOSIS — Z7984 Long term (current) use of oral hypoglycemic drugs: Secondary | ICD-10-CM | POA: Diagnosis not present

## 2018-01-05 DIAGNOSIS — R0609 Other forms of dyspnea: Secondary | ICD-10-CM | POA: Insufficient documentation

## 2018-01-05 DIAGNOSIS — E785 Hyperlipidemia, unspecified: Secondary | ICD-10-CM | POA: Diagnosis not present

## 2018-01-05 DIAGNOSIS — E1165 Type 2 diabetes mellitus with hyperglycemia: Secondary | ICD-10-CM | POA: Diagnosis not present

## 2018-01-05 DIAGNOSIS — I2510 Atherosclerotic heart disease of native coronary artery without angina pectoris: Secondary | ICD-10-CM | POA: Diagnosis not present

## 2018-01-05 DIAGNOSIS — I371 Nonrheumatic pulmonary valve insufficiency: Secondary | ICD-10-CM | POA: Diagnosis not present

## 2018-01-05 DIAGNOSIS — R079 Chest pain, unspecified: Secondary | ICD-10-CM | POA: Diagnosis not present

## 2018-01-05 DIAGNOSIS — I252 Old myocardial infarction: Secondary | ICD-10-CM | POA: Insufficient documentation

## 2018-01-25 ENCOUNTER — Encounter: Payer: Self-pay | Admitting: Family Medicine

## 2018-01-25 ENCOUNTER — Ambulatory Visit: Payer: Medicaid Other | Admitting: Family Medicine

## 2018-01-25 ENCOUNTER — Other Ambulatory Visit: Payer: Self-pay

## 2018-01-25 VITALS — BP 124/80 | HR 86 | Temp 98.2°F | Wt 263.0 lb

## 2018-01-25 DIAGNOSIS — Z23 Encounter for immunization: Secondary | ICD-10-CM

## 2018-01-25 DIAGNOSIS — E118 Type 2 diabetes mellitus with unspecified complications: Secondary | ICD-10-CM | POA: Diagnosis not present

## 2018-01-25 DIAGNOSIS — Z6841 Body Mass Index (BMI) 40.0 and over, adult: Secondary | ICD-10-CM | POA: Diagnosis not present

## 2018-01-25 MED ORDER — EMPAGLIFLOZIN 10 MG PO TABS: 20.0000 mg | ORAL_TABLET | Freq: Every day | ORAL | 3 refills | Status: DC

## 2018-01-25 NOTE — Patient Instructions (Signed)
It was a pleasure to see you today! Thank you for choosing [facility] for your primary care. Derrick Gonzales was seen for hospital f/u. Come back to the clinic in 1 month to discuss DM, and go to the emergency room if you have any more chest pain symptoms.    Today we talked about increasing your jadiance in order to get your DM under control.  You have declined insulin due to your work so we'll focus on diet control.   Please bring all your medications to every doctors visit   Sign up for My Chart to have easy access to your labs results, and communication with your Primary care physician.     Please check-out at the front desk before leaving the clinic.     Best,  Dr. Marthenia Rolling FAMILY MEDICINE RESIDENT - PGY2 01/25/2018 11:37 AM

## 2018-01-27 DIAGNOSIS — Z23 Encounter for immunization: Secondary | ICD-10-CM | POA: Insufficient documentation

## 2018-01-27 NOTE — Assessment & Plan Note (Signed)
Weight discussed as important aspect of DM control.  Patient advised to increase physical activity and to begin following diabetic diet.  We discussed food diary

## 2018-01-27 NOTE — Progress Notes (Signed)
    Subjective:  Derrick Gonzales is a 47 y.o. male who presents to the [facility] today with a chief complaint of DM control.   HPI: Recent A1C at other office was ~10 per patient.  He had recent ED visit for chest tightness (workup negative) but this has him concerned about really trying to get his diabetes better controlled.  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  He said he forgot he was prescribed coreg and a statin from the ED and will go fill those.  No new symptoms since leaving ED, has not been controlling diet.  Said he had actually been taking some old gliburide meds from a prior doctor until he ran out. Wants to know if there are more oral meds that he can take than just his current regimen  Objective:  Physical Exam: BP 124/80   Pulse 86   Temp 98.2 F (36.8 C) (Oral)   Wt 263 lb (119.3 kg)   SpO2 94%   BMI 43.77 kg/m   Gen: NAD, resting comfortably CV: RRR with no murmurs appreciated Pulm: NWOB, CTAB with no crackles, wheezes, or rhonchi GI: Soft, Nontender, Nondistended. MSK: no edema, cyanosis, or clubbing noted Skin: warm, dry Neuro: grossly normal, moves all extremities Psych: Normal affect and thought content  No results found for this or any previous visit (from the past 72 hour(s)).   Assessment/Plan:  Need for immunization against influenza Flu shot given byu CMA, April  Morbid obesity with BMI of 45.0-49.9, adult Weight discussed as important aspect of DM control.  Patient advised to increase physical activity and to begin following diabetic diet.  We discussed food diary  DM2 (diabetes mellitus, type 2) (HCC) Patient continuing 500 metform XR (does not tolerate higher doses)  Increasing jardiance to 20mg , will log sugars for a month and then we will discuss a potential sulfonyrea   Marthenia Rolling, DO FAMILY MEDICINE RESIDENT - PGY2 01/27/2018 7:52 AM

## 2018-01-27 NOTE — Assessment & Plan Note (Signed)
Patient continuing 500 metform XR (does not tolerate higher doses)  Increasing jardiance to 20mg , will log sugars for a month and then we will discuss a potential sulfonyrea

## 2018-01-27 NOTE — Assessment & Plan Note (Signed)
Flu shot given byu CMA, April

## 2018-03-05 ENCOUNTER — Encounter: Payer: Self-pay | Admitting: Family Medicine

## 2018-03-05 ENCOUNTER — Ambulatory Visit: Payer: Medicaid Other | Admitting: Family Medicine

## 2018-03-05 ENCOUNTER — Other Ambulatory Visit: Payer: Self-pay

## 2018-03-05 VITALS — BP 130/74 | HR 86 | Temp 98.2°F | Ht 64.0 in | Wt 278.2 lb

## 2018-03-05 DIAGNOSIS — Z6841 Body Mass Index (BMI) 40.0 and over, adult: Secondary | ICD-10-CM

## 2018-03-05 DIAGNOSIS — E1165 Type 2 diabetes mellitus with hyperglycemia: Secondary | ICD-10-CM | POA: Diagnosis not present

## 2018-03-05 DIAGNOSIS — N529 Male erectile dysfunction, unspecified: Secondary | ICD-10-CM | POA: Diagnosis not present

## 2018-03-05 LAB — POCT GLYCOSYLATED HEMOGLOBIN (HGB A1C): HbA1c, POC (controlled diabetic range): 9.8 % — AB (ref 0.0–7.0)

## 2018-03-05 NOTE — Progress Notes (Signed)
    Subjective:  Derrick Gonzales is a 47 y.o. male who presents to the [facility] today with a chief complaint of diabetes management and ED.   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  He has struggled with obesity and diet control on only oral DM meds.  9.8 is an improvement from 12.4 for his A1C but he would like to do better because I have told him diabetes likely contributes to his ED  ED: Still motivated sexually and this is becoming a stressor in his relationship.  We discussed continuing attempts to control DM as a likely improvement but will try sildenafil if he can't improve over the next few months.  Objective:  Physical Exam: BP 130/74   Pulse 86   Temp 98.2 F (36.8 C) (Oral)   Ht 5\' 4"  (1.626 m)   Wt 278 lb 3.2 oz (126.2 kg)   SpO2 97%   BMI 47.75 kg/m   Gen: NAD, resting comfortably, obese CV: RRR with no murmurs appreciated Pulm: NWOB, CTAB with no crackles, wheezes, or rhonchi GI: Normal bowel sounds present. Soft, Nontender, Nondistended. MSK: no edema, cyanosis, or clubbing noted Skin: warm, dry Neuro: grossly normal, moves all extremities Psych: Normal affect and thought content  No results found for this or any previous visit (from the past 72 hour(s)).   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  He has struggled with obesity and diet control on only oral DM meds.  9.8 is an improvement from 12.4 for his A1C but he would like to do better because I have told him diabetes likely contributes to his ED  Erectile dysfunction Still motivated sexually and this is becoming a stressor in his relationship.  We discussed continuing attempts to control DM as a likely improvement but will try sildenafil if he can't  improve over the next few months.   Marthenia Rolling, DO FAMILY MEDICINE RESIDENT - PGY2 03/11/2018 2:57 PM

## 2018-03-11 NOTE — Assessment & Plan Note (Signed)
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  He has struggled with obesity and diet control on only oral DM meds.  9.8 is an improvement from 12.4 for his A1C but he would like to do better because I have told him diabetes likely contributes to his ED

## 2018-03-11 NOTE — Assessment & Plan Note (Signed)
We discussed diet/exercise idea and potential changes he can make to diet.  We talked about a written diet diary but did go over his mental recollection

## 2018-03-11 NOTE — Assessment & Plan Note (Signed)
Still motivated sexually and this is becoming a stressor in his relationship.  We discussed continuing attempts to control DM as a likely improvement but will try sildenafil if he can't improve over the next few months.

## 2018-05-14 ENCOUNTER — Ambulatory Visit: Payer: Medicaid Other | Admitting: Family Medicine

## 2018-05-16 ENCOUNTER — Emergency Department (HOSPITAL_COMMUNITY)
Admission: EM | Admit: 2018-05-16 | Discharge: 2018-05-17 | Disposition: A | Discharge status: Home / Self Care | Payer: Medicaid Other | Attending: Emergency Medicine | Admitting: Emergency Medicine

## 2018-05-16 ENCOUNTER — Other Ambulatory Visit: Payer: Self-pay

## 2018-05-16 ENCOUNTER — Encounter (HOSPITAL_COMMUNITY): Payer: Self-pay | Admitting: Emergency Medicine

## 2018-05-16 DIAGNOSIS — M62830 Muscle spasm of back: Secondary | ICD-10-CM | POA: Diagnosis not present

## 2018-05-16 DIAGNOSIS — Z87891 Personal history of nicotine dependence: Secondary | ICD-10-CM | POA: Insufficient documentation

## 2018-05-16 DIAGNOSIS — E119 Type 2 diabetes mellitus without complications: Secondary | ICD-10-CM | POA: Insufficient documentation

## 2018-05-16 DIAGNOSIS — Z7984 Long term (current) use of oral hypoglycemic drugs: Secondary | ICD-10-CM | POA: Insufficient documentation

## 2018-05-16 DIAGNOSIS — M546 Pain in thoracic spine: Secondary | ICD-10-CM | POA: Insufficient documentation

## 2018-05-16 NOTE — ED Triage Notes (Signed)
Patient is complaining of mid back pain that has been going on for 3 weeks. Patient states that it made his right leg go numb. Patient states he has been taking nsaids but it is not making it better.

## 2018-05-17 ENCOUNTER — Emergency Department (HOSPITAL_COMMUNITY): Payer: Medicaid Other

## 2018-05-17 DIAGNOSIS — M546 Pain in thoracic spine: Secondary | ICD-10-CM | POA: Diagnosis not present

## 2018-05-17 MED ORDER — LIDOCAINE 5 % EX PTCH
1.0000 | MEDICATED_PATCH | CUTANEOUS | Status: DC
  Administered 2018-05-17: 1 via TRANSDERMAL
  Filled 2018-05-17: qty 1

## 2018-05-17 MED ORDER — METHOCARBAMOL 500 MG PO TABS: 500.0000 mg | ORAL_TABLET | Freq: Two times a day (BID) | ORAL | 0 refills | Status: DC

## 2018-05-17 MED ORDER — KETOROLAC TROMETHAMINE 30 MG/ML IJ SOLN
30.0000 mg | Freq: Once | INTRAMUSCULAR | Status: AC
  Administered 2018-05-17: 30 mg via INTRAMUSCULAR
  Filled 2018-05-17: qty 1

## 2018-05-17 MED ORDER — METHOCARBAMOL 500 MG PO TABS
1000.0000 mg | ORAL_TABLET | Freq: Once | ORAL | Status: AC
  Administered 2018-05-17: 1000 mg via ORAL
  Filled 2018-05-17: qty 2

## 2018-05-17 MED ORDER — HYDROCODONE-ACETAMINOPHEN 5-325 MG PO TABS
1.0000 | ORAL_TABLET | Freq: Once | ORAL | Status: AC
  Administered 2018-05-17: 1 via ORAL
  Filled 2018-05-17: qty 1

## 2018-05-17 NOTE — ED Provider Notes (Signed)
[facility] DEPT Provider Note   CSN: [id number] Arrival date & time: 05/16/18  2143     History   Chief Complaint Chief Complaint  Patient presents with  . Back Pain    HPI Derrick Gonzales is a 47 y.o. male.  Derrick Gonzales is a 47 y.o. male with a history of diabetes, hypercholesterolemia and obesity, who presents to the emergency department for evaluation of right-sided mid back pain.  This is been ongoing for the past 3 weeks.  He reports he has been taking ibuprofen regularly and this provides relief for maybe an hour but then the pain returns.  He reports that the pain has not significantly worsened but it has not yet resolved.  He reports he had one episode of numbness in the right leg for a few minutes but no other neurologic symptoms since onset of back pain.  Denies any current numbness weakness or tingling in his lower extremities or arms.  No loss of bowel or bladder control.  No associated abdominal pain, no urinary symptoms.  No chest pain or shortness of breath.  Pain is not made worse with deep breath.  No other aggravating or alleviating factors.  He reports pain feels like a tight spasm in his muscle.  No history of cancer or IV drug use.  No fevers or chills associated with pain.     Past Medical History:  Diagnosis Date  . Chest pain on exertion 12/06/2011  . Diabetes mellitus   . Morbid obesity with BMI of 45.0-49.9, adult ([place]) 12/06/2011    Patient Active Problem List   Diagnosis Date Noted  . Need for immunization against influenza 01/27/2018  . Hypercholesterolemia 08/25/2017  . Erectile dysfunction 07/22/2017  . DM2 (diabetes mellitus, type 2) ([place]) 09/15/2014  . Morbid obesity with BMI of 45.0-49.9, adult ([place]) 12/06/2011    History reviewed. No pertinent surgical history.      Home Medications    Prior to Admission medications   Medication Sig Start Date End Date Taking? Authorizing Provider    empagliflozin (JARDIANCE) 10 MG TABS tablet Take 20 mg by mouth daily. 01/25/18 07/24/18  Sherene Sires, DO  glucose blood test strip Use as instructed 06/22/14   Chari Manning A, NP  glucose monitoring kit (FREESTYLE) monitoring kit 1 each by Does not apply route as needed. 06/22/14   [name], NP  Lancets (FREESTYLE) lancets Use as instructed 06/22/14   [name], NP  metFORMIN (GLUCOPHAGE-XR) 500 MG 24 hr tablet TAKE 1 TABLET(500 MG) BY MOUTH DAILY WITH BREAKFAST 12/08/17   Sherene Sires, DO    Family History Family History  Problem Relation Age of Onset  . Coronary artery disease Father   . Heart disease Father   . Diabetes Father   . Hypertension Mother   . Coronary artery disease Unknown     Social History Social History   Tobacco Use  . Smoking status: Former Smoker    Last attempt to quit: 11/01/2011    Years since quitting: 6.5  . Smokeless tobacco: Never Used  . Tobacco comment: vaping with zero nicotine  Substance Use Topics  . Alcohol use: No    Alcohol/week: 0.0 standard drinks  . Drug use: No     Allergies   Atorvastatin   Review of Systems Review of Systems  Constitutional: Negative for chills and fever.  HENT: Negative.   Respiratory: Negative for shortness of breath.   Cardiovascular: Negative for chest pain  and leg swelling.  Gastrointestinal: Negative for abdominal pain, constipation, diarrhea, nausea and vomiting.  Genitourinary: Negative for dysuria, flank pain, frequency and hematuria.  Musculoskeletal: Positive for back pain. Negative for arthralgias, gait problem, joint swelling, myalgias and neck pain.  Skin: Negative for color change, rash and wound.  Neurological: Negative for weakness and numbness.     Physical Exam Updated Vital Signs BP (!) 168/83 (BP Location: Left Arm)   Pulse 97   Temp 98.9 F (37.2 C) (Oral)   Resp 18   Ht '5\' 5"'  (1.651 m)   Wt 124.7 kg   SpO2 97%   BMI 45.76 kg/m   Physical Exam Vitals signs and  nursing note reviewed.  Constitutional:      General: He is not in acute distress.    Appearance: He is well-developed. He is not diaphoretic.  HENT:     Head: Atraumatic.  Eyes:     General:        Right eye: No discharge.        Left eye: No discharge.  Neck:     Musculoskeletal: Neck supple.  Cardiovascular:     Rate and Rhythm: Normal rate and regular rhythm.     Pulses: Normal pulses.          Radial pulses are 2+ on the right side and 2+ on the left side.       Dorsalis pedis pulses are 2+ on the right side and 2+ on the left side.       Posterior tibial pulses are 2+ on the right side and 2+ on the left side.     Heart sounds: Normal heart sounds. No murmur. No friction rub. No gallop.   Pulmonary:     Effort: Pulmonary effort is normal. No respiratory distress.     Breath sounds: Normal breath sounds.     Comments: Respirations equal and unlabored, patient able to speak in full sentences, lungs clear to auscultation bilaterally Abdominal:     General: Bowel sounds are normal. There is no distension.     Palpations: Abdomen is soft. There is no mass.     Tenderness: There is no abdominal tenderness. There is no guarding.     Comments: Abdomen soft, nondistended, nontender to palpation in all quadrants without guarding or peritoneal signs, no CVA tenderness bilaterally  Musculoskeletal:     Comments: Tenderness to palpation over right mid back.  Pain made worse with range of motion of the lower extremities, pain is repeatedly reproducible with palpation, palpable spasm in the right thoracic back musculature noted.  Skin:    General: Skin is warm and dry.     Capillary Refill: Capillary refill takes less than 2 seconds.  Neurological:     Mental Status: He is alert and oriented to person, place, and time. Mental status is at baseline.     Comments: Alert, clear speech, following commands. Moving all extremities without difficulty. Bilateral lower extremities with 5/5  strength in proximal and distal muscle groups and with dorsi and plantar flexion. Sensation intact in bilateral lower extremities. 2+ patellar DTRs bilaterally. Ambulatory with steady gait  Psychiatric:        Mood and Affect: Mood normal.        Behavior: Behavior normal.      ED Treatments / Results  Labs (all labs ordered are listed, but only abnormal results are displayed) Labs Reviewed - No data to display  EKG None  Radiology Dg Thoracic Spine 2  View  Result Date: 05/17/2018 CLINICAL DATA:  Right lower thoracic back pain for 2 weeks. No reported injury. EXAM: THORACIC SPINE 2 VIEWS COMPARISON:  None. FINDINGS: Thoracic vertebral body heights appear preserved, with no fracture or subluxation. No suspicious focal osseous lesions. Mild degenerative disc disease throughout the thoracic spine. IMPRESSION: No thoracic spine fracture or subluxation. Mild degenerative disc disease throughout the thoracic spine. Electronically Signed   By: Ilona Sorrel M.D.   On: 05/17/2018 01:55    Procedures Procedures (including critical care time)  Medications Ordered in ED Medications  lidocaine (LIDODERM) 5 % 1 patch (1 patch Transdermal Patch Applied 05/17/18 0039)  ketorolac (TORADOL) 30 MG/ML injection 30 mg (30 mg Intramuscular Given 05/17/18 0041)  methocarbamol (ROBAXIN) tablet 1,000 mg (1,000 mg Oral Given 05/17/18 0038)  HYDROcodone-acetaminophen (NORCO/VICODIN) 5-325 MG per tablet 1 tablet (1 tablet Oral Given 05/17/18 0041)     Initial Impression / Assessment and Plan / ED Course  I have reviewed the triage vital signs and the nursing notes.  Pertinent labs & imaging results that were available during my care of the patient were reviewed by me and considered in my medical decision making (see chart for details).  Patient presents for evaluation of persistent pain over the right mid back which has been present for the last 3 weeks, not improving with NSAIDs.  No associated chest  pain or shortness of breath, no associated abdominal pain.  Patient has no numbness weakness or tingling in his extremities, no loss of bowel or bladder control, no concern for cauda equina.  No neurologic deficits on exam.  Abdomen is benign, lungs are clear.  I have low suspicion for cardiac or pulmonary etiology for patient's back pain it is repeatedly reproducible with palpation there is notable muscle spasm on exam.  No overlying skin changes or rash.  No history of IV drug use or cancer, no fevers or chills.  X-ray shows degenerative disc disease throughout the thoracic spine with no acute fracture or abnormality.  Pain treated here in the emergency department with significant improvement.  At this time patient is stable for discharge home with continued treatment with NSAIDs, muscle relaxers and over-the-counter lidocaine patches, orthopedic and PCP follow-up if symptoms not improving.  Return precautions discussed.  Patient expresses understanding and is in agreement with plan.  Discharged home in good condition.  Final Clinical Impressions(s) / ED Diagnoses   Final diagnoses:  Acute right-sided thoracic back pain    ED Discharge Orders         Ordered    methocarbamol (ROBAXIN) 500 MG tablet  2 times daily     05/17/18 0210           Jacqlyn Larsen, PA-C 05/17/18 [id number]    Shanon Rosser, MD 05/17/18 0800

## 2018-05-17 NOTE — Discharge Instructions (Addendum)
You were seen here today for Back Pain: Low back pain is discomfort in the lower back that may be due to injuries to muscles and ligaments around the spine. Occasionally, it may be caused by a problem to a part of the spine called a disc. Your back pain should be treated with medicines listed below as well as back exercises and this back pain should get better over the next 2 weeks. Most patients get completely well in 4 weeks. It is important to know however, if you develop severe or worsening pain, low back pain with fever, numbness, weakness or inability to walk or urinate, you should return to the ER immediately.  Please follow up with your doctor this week for a recheck if still having symptoms.  HOME INSTRUCTIONS Self - care:  The application of heat can help soothe the pain.  Maintaining your daily activities, including walking (this is encouraged), as it will help you get better faster than just staying in bed. Do not life, push, pull anything more than 10 pounds for the next week. I am attaching back exercises that you can do at home to help facilitate your recovery.   Medications are also useful to help with pain control.  Salon Pas Lidocaine Patches. Can be found over the counter at the pharmacy in a blue and silver box, can help provide localized relief   Acetaminophen.  This medication is generally safe, and found over the counter. Take as directed for your age. You should not take more than 8 of the extra strength (500mg ) pills a day (max dose is 4000mg  total OVER one day)  Non steroidal anti inflammatory: This includes medications including Ibuprofen, naproxen and Mobic; These medications help both pain and swelling and are very useful in treating back pain.  They should be taken with food, as they can cause stomach upset, and more seriously, stomach bleeding. Do not combine the medications.   Muscle relaxants:  These medications can help with muscle tightness that is a cause of lower  back pain.  Most of these medications can cause drowsiness, and it is not safe to drive or use dangerous machinery while taking them. They are primarily helpful when taken at night before sleep.  You will need to follow up with your primary healthcare provider or the Orthopedist in 1-2 weeks for reassessment and persistent symptoms.  Be aware that if you develop new symptoms, such as a fever, leg weakness, difficulty with or loss of control of your urine or bowels, abdominal pain, or more severe pain, you will need to seek medical attention and/or return to the Emergency department. Additional Information:  Your vital signs today were: BP (!) 168/83 (BP Location: Left Arm)    Pulse 97    Temp 98.9 F (37.2 C) (Oral)    Resp 18    Ht 5\' 5"  (1.651 m)    Wt 124.7 kg    SpO2 98%    BMI 45.76 kg/m  If your blood pressure (BP) was elevated above 135/85 this visit, please have this repeated by your doctor within one month. ---------------

## 2018-05-17 NOTE — ED Notes (Signed)
Ambulatory to restroom

## 2018-05-31 DIAGNOSIS — M545 Low back pain: Secondary | ICD-10-CM | POA: Diagnosis not present

## 2018-06-25 ENCOUNTER — Ambulatory Visit: Payer: Medicaid Other | Admitting: Family Medicine

## 2018-07-06 DIAGNOSIS — R69 Illness, unspecified: Secondary | ICD-10-CM | POA: Diagnosis not present

## 2018-07-09 ENCOUNTER — Ambulatory Visit: Payer: Medicaid Other | Admitting: Family Medicine

## 2018-07-09 ENCOUNTER — Other Ambulatory Visit: Payer: Self-pay

## 2018-07-09 ENCOUNTER — Encounter: Payer: Self-pay | Admitting: Family Medicine

## 2018-07-09 VITALS — BP 132/80 | HR 87 | Temp 98.4°F | Ht 64.0 in | Wt 272.8 lb

## 2018-07-09 DIAGNOSIS — E1165 Type 2 diabetes mellitus with hyperglycemia: Secondary | ICD-10-CM | POA: Diagnosis not present

## 2018-07-09 DIAGNOSIS — N529 Male erectile dysfunction, unspecified: Secondary | ICD-10-CM

## 2018-07-09 LAB — POCT GLYCOSYLATED HEMOGLOBIN (HGB A1C): HbA1c, POC (controlled diabetic range): 9.9 % — AB (ref 0.0–7.0)

## 2018-07-09 MED ORDER — SILDENAFIL CITRATE 25 MG PO TABS: 25.0000 mg | ORAL_TABLET | Freq: Every day | ORAL | 0 refills | Status: DC | PRN

## 2018-07-09 NOTE — Assessment & Plan Note (Addendum)
Patient still on metformin and Jardiance.  A1c of 9.9.  He said he spent 2 of the last 3 months with pretty poor eating habits but was a better over the last month.  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  He will try taking thousand metformin XR at home for week.  And if he does well on that he will call and we will change his official dosing to 1000 metformin XR.  Foot exam passed, instructed to call eye doctors for annual diabetic eye exam

## 2018-07-09 NOTE — Progress Notes (Signed)
Subjective:  Derrick Gonzales is a 48 y.o. male who presents to the [facility] today with a chief complaint of ED.   HPI: Erectile dysfunction Patient still with significant erectile dysfunction issues.  Does have interest and its begin to cause relationship stress.  We discussed the risk of heart interactions and the dangers of taking more than the prescribed single pill per dose.  Will give course of 30 PRN doses and he will return in approximately month to month and a half to discuss reaction to this.  As this medication is unlikely be covered by his insurance has been told that if he finds it at a cheaper location we can send this prescription somewhere else.  DM2 (diabetes mellitus, type 2) ([facility]) Patient still on metformin and Jardiance.  A1c of 9.9.  He said he spent 2 of the last 3 months with pretty poor eating habits but was a better over the last month.  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  He has resisted going to higher dose of metformin in the past because of prior problems with gastric upset.    Objective:  Physical Exam: BP 132/80   Pulse 87   Temp 98.4 F (36.9 C) (Oral)   Ht 5\' 4"  (1.626 m)   Wt 272 lb 12.8 oz (123.7 kg)   SpO2 95%   BMI 46.83 kg/m   Gen: NAD, pleasant, conversing comfortably CV: RRR with no murmurs appreciated Pulm: NWOB, CTAB with no crackles, wheezes, or rhonchi MSK: no edema, cyanosis, or clubbing noted Skin: warm, dry Neuro: grossly normal, moves all extremities Psych: Normal affect and thought content  Results for orders placed or performed in visit on 07/09/18 (from the past 72 hour(s))  POCT glycosylated hemoglobin (Hb A1C)     Status: Abnormal   Collection Time: 07/09/18  8:58 AM  Result Value Ref Range   Hemoglobin A1C     HbA1c POC (<> result, manual entry)     HbA1c, POC (prediabetic range)     HbA1c, POC (controlled diabetic range) 9.9 (A) 0.0 - 7.0 %     Assessment/Plan:  Erectile dysfunction Patient still with significant erectile dysfunction issues.  Does have interest and its begin to cause relationship stress.  We discussed the risk of heart interactions and the dangers of taking more than the prescribed single pill per dose.  Will give course of 30 PRN doses and he will return in approximately month to month and a half to discuss reaction to this.  As this medication is unlikely be covered by his insurance has been told that if he finds it at a cheaper location we can send this prescription somewhere else.  DM2 (diabetes mellitus, type 2) (HCC) Patient still on metformin and Jardiance.  A1c of 9.9.  He said he spent 2 of the last 3 months with pretty poor eating habits but was a better over the last month.  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  He will try taking thousand metformin XR at home for week.  And if he does well on  that he will call and we will change his official dosing to 1000 metformin XR.  Foot exam passed, instructed to call eye doctors for annual diabetic eye exam   Marthenia Rolling, DO FAMILY MEDICINE RESIDENT - PGY2 07/09/2018 9:34 AM

## 2018-07-09 NOTE — Assessment & Plan Note (Signed)
Patient still with significant erectile dysfunction issues.  Does have interest and its begin to cause relationship stress.  We discussed the risk of heart interactions and the dangers of taking more than the prescribed single pill per dose.  Will give course of 30 PRN doses and he will return in approximately month to month and a half to discuss reaction to this.  As this medication is unlikely be covered by his insurance has been told that if he finds it at a cheaper location we can send this prescription somewhere else.

## 2018-07-15 ENCOUNTER — Encounter: Payer: Self-pay | Admitting: Family Medicine

## 2018-07-16 ENCOUNTER — Encounter: Payer: Medicaid Other | Attending: Family Medicine | Admitting: *Deleted

## 2018-07-16 DIAGNOSIS — E1165 Type 2 diabetes mellitus with hyperglycemia: Secondary | ICD-10-CM | POA: Insufficient documentation

## 2018-07-16 NOTE — Patient Instructions (Signed)
Plan:  Aim for 4 Carb Choices per meal (60 grams) +/- 1 either way  Aim for 0-2 Carbs per snack if hungry  Include protein in moderation with your meals and snacks Consider reading food labels for Total Carbohydrate of foods Continue with your activity level by going to the gym for 60-120 minutes several days a week as tolerated Consider checking into the Freestyle Libre CGM as a way to check your Blood Glucose at alternate times per day  Continue taking medication as directed by MD

## 2018-07-23 NOTE — Progress Notes (Signed)
Diabetes Self-Management Education  Visit Type: First/Initial  Appt. Start Time: 1100 Appt. End Time: 1230  07/23/2018  Mr. Derrick Gonzales, identified by name and date of birth, is a 48 y.o. male with a diagnosis of Diabetes: Type 2. Patient has had diabetes for many years but no previous diabetes education. He works as tow Naval architect so does not care to prick his fingers for BG monitoring . Eating habits are mainly chips and candy during the day and one meal at night. He states he wants to learn more about what diabetes is and how he should eat today.  ASSESSMENT  There were no vitals taken for this visit. There is no height or weight on file to calculate BMI.  Diabetes Self-Management Education - 07/16/18 1110      Visit Information   Visit Type  First/Initial      Initial Visit   Diabetes Type  Type 2    Are you currently following a meal plan?  No    Are you taking your medications as prescribed?  Yes    Date Diagnosed  1991      Health Coping   How would you rate your overall health?  Fair      Psychosocial Assessment   Patient Belief/Attitude about Diabetes  --   not sure   Self-care barriers  None    Other persons present  Patient    Patient Concerns  Nutrition/Meal planning;Glycemic Control    Special Needs  None    Preferred Learning Style  No preference indicated    Learning Readiness  Change in progress    How often do you need to have someone help you when you read instructions, pamphlets, or other written materials from your doctor or pharmacy?  1 - Never    What is the last grade level you completed in school?  12      Pre-Education Assessment   Patient understands the diabetes disease and treatment process.  Needs Instruction    Patient understands incorporating nutritional management into lifestyle.  Needs Instruction    Patient undertands incorporating physical activity into lifestyle.  Needs Instruction    Patient understands using medications safely.   Needs Instruction    Patient understands monitoring blood glucose, interpreting and using results  Needs Instruction    Patient understands prevention, detection, and treatment of acute complications.  Needs Instruction    Patient understands prevention, detection, and treatment of chronic complications.  Needs Instruction    Patient understands how to develop strategies to address psychosocial issues.  Needs Instruction    Patient understands how to develop strategies to promote health/change behavior.  Needs Instruction      Complications   Last HgB A1C per patient/outside source  9.9 %    How often do you check your blood sugar?  0 times/day (not testing)    Have you had a dilated eye exam in the past 12 months?  No    Have you had a dental exam in the past 12 months?  No    Are you checking your feet?  Yes    How many days per week are you checking your feet?  7      Dietary Intake   Breakfast  skip    Snack (morning)  no    Lunch  skip    Snack (afternoon)  no    Dinner  prepares at home- meat, starch and vegetables with bread times two plates most nights  Beverage(s)  energy drinks throughout the day (average 3/day), makes own tea with 1/3 cup sugar in gallon of green tea      Exercise   Exercise Type  Moderate (swimming / aerobic walking)   working out at gym 3 days a week for 2 hours    How many days per week to you exercise?  3    How many minutes per day do you exercise?  120    Total minutes per week of exercise  360      Patient Education   Previous Diabetes Education  No    Disease state   Factors that contribute to the development of diabetes    Nutrition management   Role of diet in the treatment of diabetes and the relationship between the three main macronutrients and blood glucose level;Food label reading, portion sizes and measuring food.;Carbohydrate counting;Reviewed blood glucose goals for pre and post meals and how to evaluate the patients' food intake on  their blood glucose level.    Physical activity and exercise   Helped patient identify appropriate exercises in relation to his/her diabetes, diabetes complications and other health issue.    Medications  Reviewed patients medication for diabetes, action, purpose, timing of dose and side effects.    Monitoring  Purpose and frequency of SMBG.;Identified appropriate SMBG and/or A1C goals.    Chronic complications  Relationship between chronic complications and blood glucose control    Psychosocial adjustment  Role of stress on diabetes      Individualized Goals (developed by patient)   Nutrition  Follow meal plan discussed    Physical Activity  Exercise 3-5 times per week    Medications  take my medication as prescribed    Monitoring   test blood glucose pre and post meals as discussed      Post-Education Assessment   Patient understands the diabetes disease and treatment process.  Demonstrates understanding / competency    Patient understands incorporating nutritional management into lifestyle.  Demonstrates understanding / competency    Patient undertands incorporating physical activity into lifestyle.  Demonstrates understanding / competency    Patient understands using medications safely.  Demonstrates understanding / competency    Patient understands monitoring blood glucose, interpreting and using results  Demonstrates understanding / competency    Patient understands prevention, detection, and treatment of acute complications.  Demonstrates understanding / competency    Patient understands prevention, detection, and treatment of chronic complications.  Demonstrates understanding / competency    Patient understands how to develop strategies to address psychosocial issues.  Demonstrates understanding / competency    Patient understands how to develop strategies to promote health/change behavior.  Demonstrates understanding / competency      Outcomes   Expected Outcomes  Demonstrated  interest in learning. Expect positive outcomes    Future DMSE  PRN    Program Status  Completed       Individualized Plan for Diabetes Self-Management Training:   Learning Objective:  Patient will have a greater understanding of diabetes self-management. Patient education plan is to attend individual and/or group sessions per assessed needs and concerns.   Plan:   Patient Instructions  Plan:  Aim for 4 Carb Choices per meal (60 grams) +/- 1 either way  Aim for 0-2 Carbs per snack if hungry  Include protein in moderation with your meals and snacks Consider reading food labels for Total Carbohydrate of foods Continue with your activity level by going to the gym for 60-120 minutes  several days a week as tolerated Consider checking into the Freestyle Libre CGM as a way to check your Blood Glucose at alternate times per day  Continue taking medication as directed by MD  Expected Outcomes:  Demonstrated interest in learning. Expect positive outcomes  Education material provided: Food label handouts, A1C conversion sheet, Meal plan card and Carbohydrate counting sheet  If problems or questions, patient to contact team via:  Phone  Future DSME appointment: PRN

## 2018-07-26 ENCOUNTER — Encounter: Payer: Self-pay | Admitting: Family Medicine

## 2018-08-03 DIAGNOSIS — Z79899 Other long term (current) drug therapy: Secondary | ICD-10-CM | POA: Diagnosis not present

## 2018-08-03 DIAGNOSIS — M25552 Pain in left hip: Secondary | ICD-10-CM | POA: Diagnosis not present

## 2018-08-03 DIAGNOSIS — M1612 Unilateral primary osteoarthritis, left hip: Secondary | ICD-10-CM | POA: Diagnosis not present

## 2018-08-03 DIAGNOSIS — I252 Old myocardial infarction: Secondary | ICD-10-CM | POA: Diagnosis not present

## 2018-08-03 DIAGNOSIS — Z7982 Long term (current) use of aspirin: Secondary | ICD-10-CM | POA: Diagnosis not present

## 2018-08-03 DIAGNOSIS — Z888 Allergy status to other drugs, medicaments and biological substances status: Secondary | ICD-10-CM | POA: Diagnosis not present

## 2018-08-03 DIAGNOSIS — E119 Type 2 diabetes mellitus without complications: Secondary | ICD-10-CM | POA: Diagnosis not present

## 2018-08-03 DIAGNOSIS — M47816 Spondylosis without myelopathy or radiculopathy, lumbar region: Secondary | ICD-10-CM | POA: Diagnosis not present

## 2018-08-03 DIAGNOSIS — Z7984 Long term (current) use of oral hypoglycemic drugs: Secondary | ICD-10-CM | POA: Diagnosis not present

## 2018-08-03 DIAGNOSIS — S39012A Strain of muscle, fascia and tendon of lower back, initial encounter: Secondary | ICD-10-CM | POA: Diagnosis not present

## 2018-08-03 DIAGNOSIS — M545 Low back pain: Secondary | ICD-10-CM | POA: Diagnosis not present

## 2018-08-03 DIAGNOSIS — S7002XA Contusion of left hip, initial encounter: Secondary | ICD-10-CM | POA: Diagnosis not present

## 2018-08-04 ENCOUNTER — Telehealth: Payer: Self-pay | Admitting: Family Medicine

## 2018-08-04 NOTE — Telephone Encounter (Signed)
Patient called and message was left explained to them that we are trying to offer options to reduce risk to the coronavirus and that they should call the clinic so that they can discuss with the doctor if their needs can be handled over the phone for the next few weeks.  Was clear that we are not canceling that appointment and they are welcome to be seen if they still want to be seen by Dr. that this is just trying to reduce risk of exposure as an option.  Dr. Parke Simmers

## 2018-08-06 ENCOUNTER — Ambulatory Visit: Payer: Medicaid Other | Admitting: Family Medicine

## 2018-08-17 ENCOUNTER — Other Ambulatory Visit: Payer: Self-pay | Admitting: Family Medicine

## 2018-08-17 DIAGNOSIS — E118 Type 2 diabetes mellitus with unspecified complications: Secondary | ICD-10-CM

## 2018-09-22 ENCOUNTER — Encounter: Payer: Self-pay | Admitting: Family Medicine

## 2018-09-22 NOTE — Telephone Encounter (Signed)
Spoke with pt about this and then contacted pharmacy and she stated that it may be that they tried to fill it to soon and she ran it and today it is able to be filled and she said to let pt know that it should be ready in an hour. Pt informed of this Derrick Gonzales, CMA

## 2018-11-05 ENCOUNTER — Encounter: Payer: Self-pay | Admitting: Family Medicine

## 2018-11-26 ENCOUNTER — Other Ambulatory Visit: Payer: Self-pay

## 2018-11-26 ENCOUNTER — Emergency Department (HOSPITAL_COMMUNITY)
Admission: EM | Admit: 2018-11-26 | Discharge: 2018-11-26 | Disposition: A | Discharge status: Home / Self Care | Payer: Medicaid Other | Attending: Emergency Medicine | Admitting: Emergency Medicine

## 2018-11-26 ENCOUNTER — Emergency Department (HOSPITAL_COMMUNITY): Payer: Medicaid Other

## 2018-11-26 ENCOUNTER — Encounter (HOSPITAL_COMMUNITY): Payer: Self-pay

## 2018-11-26 DIAGNOSIS — Y9289 Other specified places as the place of occurrence of the external cause: Secondary | ICD-10-CM | POA: Insufficient documentation

## 2018-11-26 DIAGNOSIS — H40033 Anatomical narrow angle, bilateral: Secondary | ICD-10-CM | POA: Diagnosis not present

## 2018-11-26 DIAGNOSIS — Z87891 Personal history of nicotine dependence: Secondary | ICD-10-CM | POA: Insufficient documentation

## 2018-11-26 DIAGNOSIS — S20211A Contusion of right front wall of thorax, initial encounter: Secondary | ICD-10-CM | POA: Insufficient documentation

## 2018-11-26 DIAGNOSIS — Z79899 Other long term (current) drug therapy: Secondary | ICD-10-CM | POA: Diagnosis not present

## 2018-11-26 DIAGNOSIS — Y9389 Activity, other specified: Secondary | ICD-10-CM | POA: Diagnosis not present

## 2018-11-26 DIAGNOSIS — R0781 Pleurodynia: Secondary | ICD-10-CM | POA: Diagnosis not present

## 2018-11-26 DIAGNOSIS — M25562 Pain in left knee: Secondary | ICD-10-CM | POA: Insufficient documentation

## 2018-11-26 DIAGNOSIS — E119 Type 2 diabetes mellitus without complications: Secondary | ICD-10-CM | POA: Insufficient documentation

## 2018-11-26 DIAGNOSIS — H16223 Keratoconjunctivitis sicca, not specified as Sjogren's, bilateral: Secondary | ICD-10-CM | POA: Diagnosis not present

## 2018-11-26 DIAGNOSIS — Y999 Unspecified external cause status: Secondary | ICD-10-CM | POA: Insufficient documentation

## 2018-11-26 DIAGNOSIS — Z7984 Long term (current) use of oral hypoglycemic drugs: Secondary | ICD-10-CM | POA: Insufficient documentation

## 2018-11-26 DIAGNOSIS — S8992XA Unspecified injury of left lower leg, initial encounter: Secondary | ICD-10-CM | POA: Diagnosis not present

## 2018-11-26 DIAGNOSIS — S299XXA Unspecified injury of thorax, initial encounter: Secondary | ICD-10-CM | POA: Diagnosis not present

## 2018-11-26 MED ORDER — HYDROCODONE-ACETAMINOPHEN 5-325 MG PO TABS: 1.0000 | ORAL_TABLET | Freq: Four times a day (QID) | ORAL | 0 refills | Status: DC | PRN

## 2018-11-26 MED ORDER — LIDOCAINE 5 % EX PTCH: 1.0000 | MEDICATED_PATCH | CUTANEOUS | 0 refills | Status: DC

## 2018-11-26 MED ORDER — HYDROCODONE-ACETAMINOPHEN 5-325 MG PO TABS
1.0000 | ORAL_TABLET | Freq: Once | ORAL | Status: AC
  Administered 2018-11-26: 23:00:00 1 via ORAL
  Filled 2018-11-26: qty 1
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
## 2018-11-26 NOTE — ED Notes (Signed)
Pt ambulated from triage to room 23 without any assistance. Gait steady

## 2018-11-26 NOTE — Discharge Instructions (Signed)
Take ibuprofen 3 times a day with meals.  Do not take other anti-inflammatories at the same time (Advil, Motrin, naproxen, Aleve). You may supplement with Tylenol if you need further pain control. Use norco as needed for severe or breakthrough pain. Have caution, this is a narcotic. Do not drive or operate heavy machinery while taking this medication.  Use lidoderm patches and/or muscle creams (salonpas, icy hot, bengay, biofeeze) as needed for pain. Use ice packs or heating pads if this helps control your pain. Make sure you are taking deep breathes multiple times a day to prevent pneumonia.  You will likely have continued muscle stiffness and soreness over the next couple days.  Follow-up with primary care in 1 week if your symptoms are not improving. Return to the emergency room if you develop fevers, cough, difficulty breathing, shortness of breath, severe worsening pain, or any new, worsening, or concerning symptoms.

## 2018-11-26 NOTE — ED Notes (Signed)
Pt verbalized discharge instructions and follow up care. Alert and ambulatory. No IV. Family picking pt up
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
## 2018-12-21 ENCOUNTER — Other Ambulatory Visit: Payer: Self-pay | Admitting: Family Medicine

## 2018-12-21 DIAGNOSIS — E118 Type 2 diabetes mellitus with unspecified complications: Secondary | ICD-10-CM

## 2019-01-17 DIAGNOSIS — R0781 Pleurodynia: Secondary | ICD-10-CM | POA: Diagnosis not present

## 2019-01-17 DIAGNOSIS — S20211A Contusion of right front wall of thorax, initial encounter: Secondary | ICD-10-CM | POA: Diagnosis not present

## 2019-01-17 DIAGNOSIS — G8911 Acute pain due to trauma: Secondary | ICD-10-CM | POA: Diagnosis not present

## 2019-01-25 ENCOUNTER — Other Ambulatory Visit: Payer: Self-pay

## 2019-01-25 ENCOUNTER — Ambulatory Visit (INDEPENDENT_AMBULATORY_CARE_PROVIDER_SITE_OTHER): Payer: Medicaid Other | Admitting: Family Medicine

## 2019-01-25 VITALS — BP 132/68 | HR 79 | Wt 258.2 lb

## 2019-01-25 DIAGNOSIS — E1165 Type 2 diabetes mellitus with hyperglycemia: Secondary | ICD-10-CM | POA: Diagnosis not present

## 2019-01-25 DIAGNOSIS — E7800 Pure hypercholesterolemia, unspecified: Secondary | ICD-10-CM

## 2019-01-25 DIAGNOSIS — R0781 Pleurodynia: Secondary | ICD-10-CM

## 2019-01-25 DIAGNOSIS — E118 Type 2 diabetes mellitus with unspecified complications: Secondary | ICD-10-CM | POA: Diagnosis not present

## 2019-01-25 DIAGNOSIS — Z23 Encounter for immunization: Secondary | ICD-10-CM | POA: Diagnosis not present

## 2019-01-25 LAB — POCT GLYCOSYLATED HEMOGLOBIN (HGB A1C): HbA1c, POC (controlled diabetic range): 8.8 % — AB (ref 0.0–7.0)

## 2019-01-25 MED ORDER — LIRAGLUTIDE 18 MG/3ML ~~LOC~~ SOPN: 0.6000 mg | PEN_INJECTOR | Freq: Every day | SUBCUTANEOUS | 3 refills | Status: DC

## 2019-01-25 MED ORDER — SIMVASTATIN 20 MG PO TABS: 20.0000 mg | ORAL_TABLET | Freq: Every day | ORAL | 3 refills | Status: DC

## 2019-01-25 MED ORDER — PEN NEEDLES 32G X 6 MM MISC: 0.6000 mL | Freq: Every day | 1 refills | Status: DC

## 2019-01-25 MED ORDER — TRAMADOL HCL 50 MG PO TABS: 50.0000 mg | ORAL_TABLET | Freq: Three times a day (TID) | ORAL | 0 refills | Status: AC | PRN

## 2019-01-25 MED ORDER — EMPAGLIFLOZIN 25 MG PO TABS: 25.0000 mg | ORAL_TABLET | Freq: Every day | ORAL | 3 refills | Status: AC

## 2019-01-25 NOTE — Patient Instructions (Addendum)
It was a pleasure to see you today! Thank you for choosing [facility] for your primary care. Derrick Gonzales was seen for rib pain and diates. Come back to the clinic when you want to discuss your diabetes more.  Today we talked about rib pain and the need to continue using your incentive spirometer(breathing tube) to avoid getting pneumonia, were going to give you a weeks worth of tramadol that you can try and spread out for pain control.  Unfortunately bruised and/or broken ribs hurt for a long time.  We rechecked her A1c and there was a significant improvement from 9.9 to 8.8.  We are extremely happy about this and think that we can go up on your Jardiance a little bit more to 25 mg from the 20 you have been taking.  Since you have not had one for a while I will order one blood test on the way out the door to check your kidney function again.  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  Please do not forget that if you get sick or dehydrated that you should stop your Jardiance until you are feeling better.    Please bring all your medications to every doctors visit   Sign up for My Chart to have easy access to your labs results, and communication with your Primary care physician.     Please check-out at the front desk before leaving the clinic.     Best,  Dr. Sherene Sires FAMILY MEDICINE RESIDENT - PGY3 01/25/2019 11:12 AM

## 2019-01-27 DIAGNOSIS — R0781 Pleurodynia: Secondary | ICD-10-CM | POA: Insufficient documentation

## 2019-01-27 NOTE — Assessment & Plan Note (Signed)
Increase Jardiance to 25 from 20, he has been doing well on this.  BMP was ordered but patient left before it was drawn, he was called and asked to come back and get this done.  He is also been taught how to use Victoza using a trainer in the office.  He will pick this up and let us know what is working for him when he comes back to see [name] in a few weeks.

## 2019-01-27 NOTE — Assessment & Plan Note (Signed)
Patient consents to flu shot 

## 2019-01-27 NOTE — Progress Notes (Signed)
    Subjective:  Derrick Gonzales is a 48 y.o. male who presents to the [facility] today with a chief complaint of rib pain.   HPI: Rib pain on right side Patient recently seen in the ED for mechanical fall, still has rib pain, x-ray did not find any fractures or evidence of pneumothorax.  He has no significant bruising.  He is still able to walk around but it just hurts.  He would like a work note for reduced light duty.  Need for immunization against influenza Patient consents to flu shot  DM2 (diabetes mellitus, type 2) ([place]) Thinks has been doing well with his diet, A1c down to 8.8 from 9.9 prior.  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    Objective:  Physical Exam: BP 132/68   Pulse 79   Wt 258 lb 3.2 oz (117.1 kg)   SpO2 98%   BMI 42.97 kg/m   Gen: NAD, uncomfortable when twisting torso  CV: RRR with no murmurs appreciated Pulm: NWOB, CTAB with no crackles, wheezes, or rhonchi MSK: *Significant point tenderness to right rib cage on palpation, no deformation noted.  No edema, cyanosis, or clubbing noted Skin: warm, dry Neuro: grossly normal, moves all extremities Psych: Normal affect and thought content  Results for orders placed or performed in visit on 01/25/19 (from the past 72 hour(s))  HgB A1c     Status: Abnormal   Collection Time: 01/25/19 11:07 AM  Result Value Ref Range   Hemoglobin A1C     HbA1c POC (<> result, manual entry)     HbA1c, POC (prediabetic range)     HbA1c, POC (controlled diabetic range) 8.8 (A) 0.0 - 7.0 %     Assessment/Plan:  Rib pain on right side Patient recently seen in the ED for mechanical fall, still has rib pain, x-ray did not find any fractures or evidence of pneumothorax.  He has no significant bruising.  We discussed that this is likely rib contusion versus x-ray  negative rib fracture and that this could hurt for weeks at a time.  He has been using incentive spirometer and is been advised to continue doing that.  Need for immunization against influenza Patient consents to flu shot  Hypercholesterolemia Patient had negative reaction to atorvastatin, he does agree to try a different statin medication.  He will stop if there are any negative effects.  DM2 (diabetes mellitus, type 2) ([place]) Increase Jardiance to 25 from 86, he has been doing well on this.  BMP was ordered but patient left before it was drawn, he was called and asked to come back and get this done.  He is also been taught how to use Victoza using a trainer in the office.  He will pick this up and let us know what is working for him when he comes back to see [name] in a few weeks.   Sherene Sires, DO FAMILY MEDICINE RESIDENT - PGY3 01/27/2019 10:22 AM

## 2019-01-27 NOTE — Assessment & Plan Note (Signed)
Patient recently seen in the ED for mechanical fall, still has rib pain, x-ray did not find any fractures or evidence of pneumothorax.  He has no significant bruising.  We discussed that this is likely rib contusion versus x-ray negative rib fracture and that this could hurt for weeks at a time.  He has been using incentive spirometer and is been advised to continue doing that.

## 2019-01-27 NOTE — Assessment & Plan Note (Addendum)
Patient had negative reaction to atorvastatin, he does agree to try a different statin medication.  He will stop if there are any negative effects.

## 2019-04-29 ENCOUNTER — Other Ambulatory Visit: Payer: Self-pay | Admitting: Family Medicine

## 2019-04-29 DIAGNOSIS — E118 Type 2 diabetes mellitus with unspecified complications: Secondary | ICD-10-CM

## 2019-04-29 DIAGNOSIS — E7800 Pure hypercholesterolemia, unspecified: Secondary | ICD-10-CM

## 2019-05-04 NOTE — Telephone Encounter (Addendum)
LVM to call office back to inform him of below and to assist in getting a lab scheduled and then a follow up appointment 3-4 days later, if he calls back please assit him in getting this scheduled this can be in person or virtual per Dr. Criss Rosales.  I will also send pt a My Chart message informing him of this. Lindley Stachnik Zimmerman Rumple, CMA

## 2019-05-06 NOTE — Telephone Encounter (Signed)
Pt called back and made lab visit for 05/23/19 and virtual visit for 05/25/19.  Talbot Grumbling, RN

## 2019-05-23 ENCOUNTER — Other Ambulatory Visit: Payer: Medicaid Other

## 2019-05-24 ENCOUNTER — Encounter: Payer: Self-pay | Admitting: Family Medicine

## 2019-05-24 ENCOUNTER — Other Ambulatory Visit: Payer: Medicaid Other

## 2019-05-25 ENCOUNTER — Telehealth (INDEPENDENT_AMBULATORY_CARE_PROVIDER_SITE_OTHER): Payer: Medicaid Other | Admitting: Family Medicine

## 2019-05-25 ENCOUNTER — Other Ambulatory Visit: Payer: Self-pay

## 2019-05-25 DIAGNOSIS — E1165 Type 2 diabetes mellitus with hyperglycemia: Secondary | ICD-10-CM

## 2019-05-25 DIAGNOSIS — N529 Male erectile dysfunction, unspecified: Secondary | ICD-10-CM

## 2019-05-25 MED ORDER — SILDENAFIL CITRATE 20 MG PO TABS: 20.0000 mg | ORAL_TABLET | Freq: Every day | ORAL | 0 refills | Status: AC | PRN

## 2019-05-25 NOTE — Assessment & Plan Note (Signed)
Patient has been taking all the prescribed medication but has not been checking blood sugars, says they have "been eating like it is the holidays ".  As this was a virtual visit I did place the monitoring labs as a future order and they will come by once to get the car fixed to get this done.  At that point we can consider further discussions on trying to control this better.  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

## 2019-05-25 NOTE — Progress Notes (Signed)
Virtual Visit via Telephone Note  I connected with Derrick Gonzales on 05/25/19 at  8:50 AM EST by telephone and verified that I am speaking with the correct person using two identifiers.  Location: Patient: at home Provider: [facility] clinic   I discussed the limitations, risks, security and privacy concerns of performing an evaluation and management service by telephone and the availability of in person appointments. I also discussed with the patient that there may be a patient responsible charge related to this service. The patient expressed understanding and agreed to proceed.   History of Present Illness: Long-term diabetic, marginal control but due to occupation wants to avoid insulin.  Has not been checking his blood sugars over the last few months but has been taking his medication regularly.  He says he will try to start checking his blood sugars more often.  Otherwise feels well, still having issues with erectile dysfunction but has not been picking up the medication because he said it was too expensive he is open to trying generic at a different location for cheaper options.  Would like to get his normal maintenance labs but has had car trouble and could not come in today so would like to schedule those for a future lab visit.  Would like to then do a follow-up phone visit to discuss the results.  He is doubtful that his diabetes will be better on this lab because of the holidays.   Observations/Objective: Pleasant speaking in full sentences, in no distress.  Has good insight into his medical history  Assessment and Plan: Erectile dysfunction Still having trouble maintaining an erection, weight and potentially uncontrolled diabetes likely contribution factor.  Patient has been prescribe sildenafil and the past but did not pick up because they could not afford it.  Looked up prescription on good Rx and have sent to cheapest location is generic in hopes that they can afford this.   Precautions given.  Patient understands  DM2 (diabetes mellitus, type 2) (HCC) Patient has been taking all the prescribed medication but has not been checking blood sugars, says they have "been eating like it is the holidays ".  As this was a virtual visit I did place the monitoring labs as a future order and they will come by once to get the car fixed to get this done.  At that point we can consider further discussions on trying to control this better.  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    Follow Up Instructions:    I discussed the assessment and treatment plan with the patient. The patient was provided an opportunity to ask questions and all were answered. The patient agreed with the plan and demonstrated an understanding of the instructions.   The patient was advised to call back or seek an in-person evaluation if the symptoms worsen or if the condition fails to improve as anticipated.  I provided 12 minutes of non-face-to-face time during this encounter.   Marthenia Rolling, DO

## 2019-05-25 NOTE — Assessment & Plan Note (Signed)
Still having trouble maintaining an erection, weight and potentially uncontrolled diabetes likely contribution factor.  Patient has been prescribe sildenafil and the past but did not pick up because they could not afford it.  Looked up prescription on good Rx and have sent to cheapest location is generic in hopes that they can afford this.  Precautions given.  Patient understands

## 2019-06-03 DIAGNOSIS — M25511 Pain in right shoulder: Secondary | ICD-10-CM | POA: Diagnosis not present

## 2019-06-03 DIAGNOSIS — R0602 Shortness of breath: Secondary | ICD-10-CM | POA: Diagnosis not present

## 2019-06-03 DIAGNOSIS — I4510 Unspecified right bundle-branch block: Secondary | ICD-10-CM | POA: Diagnosis not present

## 2019-06-03 DIAGNOSIS — T148XXA Other injury of unspecified body region, initial encounter: Secondary | ICD-10-CM | POA: Diagnosis not present

## 2019-06-03 DIAGNOSIS — I10 Essential (primary) hypertension: Secondary | ICD-10-CM | POA: Diagnosis not present

## 2019-06-03 DIAGNOSIS — M79621 Pain in right upper arm: Secondary | ICD-10-CM | POA: Diagnosis not present

## 2019-06-03 DIAGNOSIS — E669 Obesity, unspecified: Secondary | ICD-10-CM | POA: Diagnosis not present

## 2019-06-03 DIAGNOSIS — M79644 Pain in right finger(s): Secondary | ICD-10-CM | POA: Diagnosis not present

## 2019-06-03 DIAGNOSIS — Z87891 Personal history of nicotine dependence: Secondary | ICD-10-CM | POA: Diagnosis not present

## 2019-06-03 DIAGNOSIS — Z7984 Long term (current) use of oral hypoglycemic drugs: Secondary | ICD-10-CM | POA: Diagnosis not present

## 2019-06-03 DIAGNOSIS — E119 Type 2 diabetes mellitus without complications: Secondary | ICD-10-CM | POA: Diagnosis not present

## 2019-06-03 DIAGNOSIS — R0600 Dyspnea, unspecified: Secondary | ICD-10-CM | POA: Diagnosis not present

## 2019-06-03 DIAGNOSIS — R200 Anesthesia of skin: Secondary | ICD-10-CM | POA: Diagnosis not present

## 2019-06-03 DIAGNOSIS — S46911A Strain of unspecified muscle, fascia and tendon at shoulder and upper arm level, right arm, initial encounter: Secondary | ICD-10-CM | POA: Diagnosis not present

## 2019-06-08 ENCOUNTER — Other Ambulatory Visit: Payer: Medicaid Other

## 2019-07-12 DIAGNOSIS — H1013 Acute atopic conjunctivitis, bilateral: Secondary | ICD-10-CM | POA: Diagnosis not present

## 2019-08-10 ENCOUNTER — Other Ambulatory Visit: Payer: Self-pay | Admitting: Family Medicine

## 2019-08-10 DIAGNOSIS — E118 Type 2 diabetes mellitus with unspecified complications: Secondary | ICD-10-CM

## 2019-09-14 DIAGNOSIS — R55 Syncope and collapse: Secondary | ICD-10-CM | POA: Diagnosis not present

## 2019-09-14 DIAGNOSIS — I10 Essential (primary) hypertension: Secondary | ICD-10-CM | POA: Diagnosis not present

## 2019-09-14 DIAGNOSIS — E1165 Type 2 diabetes mellitus with hyperglycemia: Secondary | ICD-10-CM | POA: Diagnosis not present

## 2019-09-14 DIAGNOSIS — R000 Tachycardia, unspecified: Secondary | ICD-10-CM | POA: Diagnosis not present

## 2019-09-15 ENCOUNTER — Encounter: Payer: Self-pay | Admitting: Family Medicine

## 2019-09-16 ENCOUNTER — Ambulatory Visit (INDEPENDENT_AMBULATORY_CARE_PROVIDER_SITE_OTHER): Payer: Medicaid Other | Admitting: Family Medicine

## 2019-09-16 ENCOUNTER — Other Ambulatory Visit: Payer: Self-pay

## 2019-09-16 VITALS — BP 140/82 | HR 88 | Ht 65.0 in | Wt 263.4 lb

## 2019-09-16 DIAGNOSIS — R55 Syncope and collapse: Secondary | ICD-10-CM | POA: Diagnosis not present

## 2019-09-16 DIAGNOSIS — E1165 Type 2 diabetes mellitus with hyperglycemia: Secondary | ICD-10-CM | POA: Diagnosis not present

## 2019-09-16 DIAGNOSIS — N529 Male erectile dysfunction, unspecified: Secondary | ICD-10-CM | POA: Diagnosis not present

## 2019-09-16 LAB — GLUCOSE, POCT (MANUAL RESULT ENTRY): POC Glucose: 276 mg/dl — AB (ref 70–99)

## 2019-09-16 LAB — POCT GLYCOSYLATED HEMOGLOBIN (HGB A1C): HbA1c, POC (controlled diabetic range): 10.2 % — AB (ref 0.0–7.0)

## 2019-09-16 MED ORDER — INSULIN GLARGINE 100 UNIT/ML SOLOSTAR PEN: 10.0000 [IU] | PEN_INJECTOR | SUBCUTANEOUS | 0 refills | Status: DC

## 2019-09-16 MED ORDER — LIRAGLUTIDE 18 MG/3ML ~~LOC~~ SOPN: 1.2000 mg | PEN_INJECTOR | Freq: Every day | SUBCUTANEOUS | 3 refills | Status: DC

## 2019-09-16 MED ORDER — SILDENAFIL CITRATE 20 MG PO TABS: 20.0000 mg | ORAL_TABLET | Freq: Every day | ORAL | 0 refills | Status: DC | PRN

## 2019-09-16 NOTE — Patient Instructions (Signed)
Today we talked about trying to control your diabetes.  We did increase your Victoza to 1.2 from 0.6.  We are also going to start a long-acting insulin called Lantus.  She went to start using 10 units/day.  You can take your first dose as soon as you get home.  Each morning when you wake up, if you are fasting blood glucose is over 200 you are going to add 2 units to your morning dose.  If at any point you wake up and your blood sugar is under 100, do not take your dose that morning and please call us to check in.  If you start to feel like you are feeling weaker something is wrong, please check your blood sugar if it is below 100 you might need to take some orange juice or candy in order to make sure that your blood sugar goes back up.  If this happens please also call us.  We are going to have you schedule an appointment with Dr. Raymondo Band to work over the steps of getting your Josephine Igo because we might be able to get it partially covered or reduced in cost.  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  We did send a prescription for sildenafil over to the [facility] [employer].  Dr. Parke Simmers

## 2019-09-17 DIAGNOSIS — R55 Syncope and collapse: Secondary | ICD-10-CM | POA: Insufficient documentation

## 2019-09-17 LAB — COMPREHENSIVE METABOLIC PANEL
ALT: 17 IU/L (ref 0–44)
AST: 13 IU/L (ref 0–40)
Albumin/Globulin Ratio: 1.9 (ref 1.2–2.2)
Albumin: 4.2 g/dL (ref 4.0–5.0)
Alkaline Phosphatase: 58 IU/L (ref 39–117)
BUN/Creatinine Ratio: 21 — ABNORMAL HIGH (ref 9–20)
BUN: 15 mg/dL (ref 6–24)
Bilirubin Total: 0.4 mg/dL (ref 0.0–1.2)
CO2: 22 mmol/L (ref 20–29)
Calcium: 8.9 mg/dL (ref 8.7–10.2)
Chloride: 103 mmol/L (ref 96–106)
Creatinine, Ser: 0.72 mg/dL — ABNORMAL LOW (ref 0.76–1.27)
GFR calc Af Amer: 128 mL/min/{1.73_m2} (ref >59)
GFR calc non Af Amer: 111 mL/min/{1.73_m2} (ref >59)
Globulin, Total: 2.2 g/dL (ref 1.5–4.5)
Glucose: 262 mg/dL — ABNORMAL HIGH (ref 65–99)
Potassium: 4 mmol/L (ref 3.5–5.2)
Sodium: 141 mmol/L (ref 134–144)
Total Protein: 6.4 g/dL (ref 6.0–8.5)

## 2019-09-17 LAB — GLUTAMIC ACID DECARBOXYLASE AUTO ABS: Glutamic Acid Decarb Ab: <5 U/mL (ref 0.0–5.0)

## 2019-09-17 NOTE — Progress Notes (Signed)
    SUBJECTIVE:   CHIEF COMPLAINT / HPI: Syncope  Patient had syncopal episode 2 days ago after getting up off the toilet he said he got about 10 steps and then woke up on the floor.  He denies hitting his head and said that he had felt completely fine that day as well as felt completely fine once he woke up.  He said that he did call EMS and that they told him his blood sugar was 457 but that his vital signs were reasonably stable.  According to him, he said they told him that they could take him to the emergency department but all that would happen if someone will give him insulin and send him home.  He said they did do an Ecg which he brought in and showed a right bundle branch block.  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  Of note he has been in the past try to avoid insulin for diabetes control because of a DOT license concern but he is no longer using that and is willing to discuss insulin at this visit  PERTINENT  PMH / PSH:   OBJECTIVE:   BP 140/82   Pulse 88   Ht 5\' 5"  (1.651 m)   Wt 263 lb 6.4 oz (119.5 kg)   SpO2 96%   BMI 43.83 kg/m   General: Alert, pleasant, no distress Cardiac: Regular rate rhythm, no chest pain complaints Respiratory: No increased work of breathing, no cough, no wheeze  ASSESSMENT/PLAN:   Erectile dysfunction Patient had good response and no negative effects with sildenafil 20, he is since out of these pills and would like a refill.  DM2 (diabetes mellitus, type 2) (HCC) Despite Victoza, Jardiance, Metformin patient consistently has blood glucose over 200 and was most recently at 450.  Do not believe he is in DKA today but will get BMP just in case.  Victoza, increased to 1.2 on 09/16/2019  Lantus started with 10 units on 09/16/2019, instructed to take in the morning and increase by 2  units every day that his fasting glucose is over 200.  Will check in with Dr. 09/18/2019 in 1 week for redosing and starting paperwork for CGM  Syncope Based off story, suspect vasovagal given syncope 10 steps from toilet with no prodrome, no aftereffects and otherwise feeling well.  Given that EKG from EMS did show right bundle branch block will order echo at abundance of caution.  Emergency precautions discussed with patient     Raymondo Band, DO [facility]

## 2019-09-17 NOTE — Assessment & Plan Note (Signed)
Based off story, suspect vasovagal given syncope 10 steps from toilet with no prodrome, no aftereffects and otherwise feeling well.  Given that EKG from EMS did show right bundle branch block will order echo at abundance of caution.  Emergency precautions discussed with patient

## 2019-09-17 NOTE — Assessment & Plan Note (Signed)
Patient had good response and no negative effects with sildenafil 20, he is since out of these pills and would like a refill.

## 2019-09-17 NOTE — Assessment & Plan Note (Signed)
Despite Victoza, Jardiance, Metformin patient consistently has blood glucose over 200 and was most recently at 450.  Do not believe he is in DKA today but will get BMP just in case.  Victoza, increased to 1.2 on 09/16/2019  Lantus started with 10 units on 09/16/2019, instructed to take in the morning and increase by 2 units every day that his fasting glucose is over 200.  Will check in with Dr. Raymondo Band in 1 week for redosing and starting paperwork for CGM

## 2019-09-19 ENCOUNTER — Telehealth: Payer: Self-pay | Admitting: Family Medicine

## 2019-09-19 ENCOUNTER — Ambulatory Visit (HOSPITAL_COMMUNITY): Admission: RE | Admit: 2019-09-19 | Payer: Medicaid Other | Source: Ambulatory Visit

## 2019-09-19 NOTE — Telephone Encounter (Signed)
They are calling from [facility] to inform Dr. Parke Simmers that the patient no showed his appoint there today 09/19/19.

## 2019-09-22 ENCOUNTER — Ambulatory Visit: Payer: Medicaid Other | Admitting: Pharmacist

## 2019-09-23 ENCOUNTER — Telehealth: Payer: Self-pay | Admitting: *Deleted

## 2019-09-23 NOTE — Telephone Encounter (Signed)
-----   Message from Marthenia Rolling, DO sent at 09/22/2019  2:35 PM EDT ----- Regarding: RE: [place], please call patient and schedule him with dr. Raymondo Band to try and get started on getting a cgm and for medication adjustment (new to insulin) ----- Message ----- From: Kathrin Ruddy, RPH-CPP Sent: 09/22/2019   2:08 PM EDT To: Marthenia Rolling, DO  Patient did not come to visit - FYI  Would you like to f/u by phone or do you want me to contact and request he reschedule?  ----- Message ----- From: Marthenia Rolling, DO Sent: 09/17/2019   5:47 PM EDT To: Kathrin Ruddy, RPH-CPP  Sees you 5/6

## 2019-09-23 NOTE — Telephone Encounter (Signed)
Pt is scheduled with Dr. Raymondo Band on 09/29/2019. Derrick Gonzales, CMA

## 2019-09-29 ENCOUNTER — Ambulatory Visit (INDEPENDENT_AMBULATORY_CARE_PROVIDER_SITE_OTHER): Payer: Medicaid Other | Admitting: Pharmacist

## 2019-09-29 ENCOUNTER — Other Ambulatory Visit: Payer: Self-pay

## 2019-09-29 ENCOUNTER — Encounter: Payer: Self-pay | Admitting: Pharmacist

## 2019-09-29 DIAGNOSIS — E1165 Type 2 diabetes mellitus with hyperglycemia: Secondary | ICD-10-CM

## 2019-09-29 MED ORDER — LIRAGLUTIDE 18 MG/3ML ~~LOC~~ SOPN: 1.2000 mg | PEN_INJECTOR | Freq: Every day | SUBCUTANEOUS | Status: DC

## 2019-09-29 MED ORDER — DEXCOM G6 RECEIVER DEVI
1.0000 | 1 refills | Status: DC
Start: 2019-09-29 — End: 2022-05-05

## 2019-09-29 MED ORDER — INSULIN GLARGINE 100 UNIT/ML SOLOSTAR PEN: 16.0000 [IU] | PEN_INJECTOR | SUBCUTANEOUS | 0 refills | Status: DC

## 2019-09-29 MED ORDER — DEXCOM G6 TRANSMITTER MISC
1.0000 | 3 refills | Status: DC
Start: 2019-09-29 — End: 2020-08-01

## 2019-09-29 MED ORDER — DEXCOM G6 SENSOR MISC
1.0000 | 11 refills | Status: DC
Start: 2019-09-29 — End: 2020-10-19

## 2019-09-29 NOTE — Progress Notes (Signed)
S:     Chief Complaint  Patient presents with  . Medication Management    Diabetes Management    Patient arrives in good spirits, ambulating without assistance and no apparent distress. Reports working for [employer] and enjoying it.  Presents for diabetes evaluation, education, and management Patient was referred and last seen by Primary Care Provider, Dr. Parke Simmers on 09/16/2019.   Patient reports Diabetes was diagnosed in 2001.   Family/Social History: DM (dad, paternal side), heart disease (dad), occasional drinking  Insurance coverage/medication affordability: Medicaid  Medication adherence reported good .   Current diabetes medications include: Jardiance (empagliflozin) 25 mg daily, metformin XR 500 mg daily, Victoza (liraglutide) 1.2 mg daily Current hypertension medications include: none Current hyperlipidemia medications include: simvastatin 20 mg daily  Patient denies hypoglycemic events.  Patient reported dietary habits: Eats 2 meals/day Breakfast: cereal Lunch: not usual Dinner: varies, ate "skyline chili" (angel hair pasta, cheese, beans, onions) Snacks:none reported Drinks: coke zero products   O:  Physical Exam Constitutional:      Appearance: Normal appearance. He is obese.  Pulmonary:     Effort: Pulmonary effort is normal.  Neurological:     Mental Status: He is alert.  Psychiatric:        Mood and Affect: Mood normal.        Behavior: Behavior normal.        Thought Content: Thought content normal.        Judgment: Judgment normal.      Review of Systems  Gastrointestinal: Positive for nausea.       Patient reports nausea with Victoza dose  Neurological: Positive for dizziness.       Patient reports dizziness with nausea and Victoza dose     Lab Results  Component Value Date   HGBA1C 10.2 (A) 09/16/2019   There were no vitals filed for this visit.  Lipid Panel     Component Value Date/Time   CHOL 224 (H) 08/24/2017 1105     TRIG 276 (H) 08/24/2017 1105   HDL 40 08/24/2017 1105   CHOLHDL 5.6 (H) 08/24/2017 1105   CHOLHDL 4.8 06/22/2014 1635   VLDL 30 06/22/2014 1635   LDLCALC 129 (H) 08/24/2017 1105    Home fasting blood sugars: reports in the 180's-190's    Clinical Atherosclerotic Cardiovascular Disease (ASCVD): No  The 10-year ASCVD risk score Denman George DC Jr., et al., 2013) is: 8.6%   Values used to calculate the score:     Age: 55 years     Sex: Male     Is Non-Hispanic African American: No     Diabetic: Yes     Tobacco smoker: No     Systolic Blood Pressure: 140 mmHg     Is BP treated: No     HDL Cholesterol: 40 mg/dL     Total Cholesterol: 224 mg/dL    A/P: Diabetes longstanding and currently uncontrolled.  Medication adherence appears excellent. Control is suboptimal due to medication side effects (Victoza causing nausea), limited exercise, suboptimal diet. -Increased dose of basal insulin Lantus (insulin glargine) to 16 units daily. -Continued GLP-1 Victoza (generic name liraglutide) 1.2 mg daily. Patient reported nausea/dizziness with current dose. Will plan to increase dose in a couple of weeks if nausea/dizziness goes away.   -Continued SGLT2-I Jardiance (generic name empagliflozin) 25 mg daily. -Extensively discussed pathophysiology of diabetes, recommended lifestyle interventions, dietary effects on blood sugar control -Counseled on s/sx of and management of hypoglycemia -Plan to start  Dexcom G6 with patient. Transmitter, sensor, receiver sent to pharmacy.  -Next A1C anticipated 3 months.   ASCVD risk - primary prevention in patient with diabetes. Last LDL is not controlled. ASCVD risk score is not >20%  - moderate intensity statin indicated. Aspirin is not indicated.  -Continued simvastatin 20 mg daily. Denies any headaches with this agent.    Written patient instructions provided.  Total time in face to face counseling 25 minutes.   Follow up Pharmacist call in 2 weeks. Clinic  Visit when [facility] G6 materials are approved and available to patient.   Patient seen with Sherre Poot, PharmD Candidate and Cristela Felt, PharmD PGY-1 Resident.

## 2019-09-29 NOTE — Patient Instructions (Addendum)
It was a pleasure to meet you today!  Today, we talked about your medicine. Your blood sugar is improving!   Please continue to use your Victoza (liraglutide) 1.2 mg daily. We will continue it for a few weeks and then see if we can increase the dose later on.   We adjusted your insulin dose to 16 units daily.   We will be working on sending a Dexcom G6 to the pharmacy and getting it approved by Medicaid. Once it is approved, you'll be able to pick up 3 items: 1. Transmitter 2. Sensor 3. Receiver  Once you have these items, we will schedule a follow-up to help you set up the Dexcom G6. Dr. Raymondo Band will call you in a couple of days to see how you are doing on your new insulin dose and Victoza dose.

## 2019-09-29 NOTE — Assessment & Plan Note (Signed)
Diabetes longstanding and currently uncontrolled.  Medication adherence appears excellent. Control is suboptimal due to medication side effects (Victoza causing nausea), limited exercise, suboptimal diet. -Increased dose of basal insulin Lantus (insulin glargine) to 16 units daily. -Continued GLP-1 Victoza (generic name liraglutide) 1.2 mg daily. Patient reported nausea/dizziness with current dose. Will plan to increase dose in a couple of weeks if nausea/dizziness goes away.   -Continued SGLT2-I Jardiance (generic name empagliflozin) 25 mg daily. -Extensively discussed pathophysiology of diabetes, recommended lifestyle interventions, dietary effects on blood sugar control -Counseled on s/sx of and management of hypoglycemia -Plan to start Dexcom G6 with patient. Transmitter, sensor, receiver sent to pharmacy.  -Next A1C anticipated 3 months.

## 2019-09-30 NOTE — Progress Notes (Signed)
Reviewed: I agree with Dr. Koval's documentation and management. 

## 2019-10-11 ENCOUNTER — Encounter (HOSPITAL_COMMUNITY): Payer: Self-pay | Admitting: Emergency Medicine

## 2019-10-11 ENCOUNTER — Other Ambulatory Visit: Payer: Self-pay

## 2019-10-11 ENCOUNTER — Emergency Department (HOSPITAL_COMMUNITY)
Admission: EM | Admit: 2019-10-11 | Discharge: 2019-10-11 | Disposition: A | Discharge status: Home / Self Care | Payer: Medicaid Other | Attending: Emergency Medicine | Admitting: Emergency Medicine

## 2019-10-11 DIAGNOSIS — E119 Type 2 diabetes mellitus without complications: Secondary | ICD-10-CM | POA: Insufficient documentation

## 2019-10-11 DIAGNOSIS — Z87891 Personal history of nicotine dependence: Secondary | ICD-10-CM | POA: Diagnosis not present

## 2019-10-11 DIAGNOSIS — Z7984 Long term (current) use of oral hypoglycemic drugs: Secondary | ICD-10-CM | POA: Diagnosis not present

## 2019-10-11 DIAGNOSIS — Z79899 Other long term (current) drug therapy: Secondary | ICD-10-CM | POA: Diagnosis not present

## 2019-10-11 DIAGNOSIS — K920 Hematemesis: Secondary | ICD-10-CM | POA: Insufficient documentation

## 2019-10-11 LAB — COMPREHENSIVE METABOLIC PANEL
ALT: 23 U/L (ref 0–44)
AST: 18 U/L (ref 15–41)
Albumin: 3.9 g/dL (ref 3.5–5.0)
Alkaline Phosphatase: 43 U/L (ref 38–126)
Anion gap: 10 (ref 5–15)
BUN: 15 mg/dL (ref 6–20)
CO2: 26 mmol/L (ref 22–32)
Calcium: 8.7 mg/dL — ABNORMAL LOW (ref 8.9–10.3)
Chloride: 108 mmol/L (ref 98–111)
Creatinine, Ser: 0.69 mg/dL (ref 0.61–1.24)
GFR calc Af Amer: >60 mL/min (ref >60)
GFR calc non Af Amer: >60 mL/min (ref >60)
Glucose, Bld: 169 mg/dL — ABNORMAL HIGH (ref 70–99)
Potassium: 3.9 mmol/L (ref 3.5–5.1)
Sodium: 144 mmol/L (ref 135–145)
Total Bilirubin: 0.5 mg/dL (ref 0.3–1.2)
Total Protein: 6.8 g/dL (ref 6.5–8.1)

## 2019-10-11 LAB — TYPE AND SCREEN
ABO/RH(D): A POS
Antibody Screen: NEGATIVE

## 2019-10-11 LAB — CBC
HCT: 46.2 % (ref 39.0–52.0)
Hemoglobin: 15.4 g/dL (ref 13.0–17.0)
MCH: 31 pg (ref 26.0–34.0)
MCHC: 33.3 g/dL (ref 30.0–36.0)
MCV: 93 fL (ref 80.0–100.0)
Platelets: 211 10*3/uL (ref 150–400)
RBC: 4.97 MIL/uL (ref 4.22–5.81)
RDW: 12.4 % (ref 11.5–15.5)
WBC: 8.7 10*3/uL (ref 4.0–10.5)
nRBC: 0 % (ref 0.0–0.2)

## 2019-10-11 LAB — ABO/RH: ABO/RH(D): A POS

## 2019-10-11 LAB — ETHANOL: Alcohol, Ethyl (B): <10 mg/dL (ref <10)

## 2019-10-11 LAB — LIPASE, BLOOD: Lipase: 35 U/L (ref 11–51)

## 2019-10-11 MED ORDER — FAMOTIDINE IN NACL 20-0.9 MG/50ML-% IV SOLN
20.0000 mg | Freq: Once | INTRAVENOUS | Status: AC
  Administered 2019-10-11: 20 mg via INTRAVENOUS
  Filled 2019-10-11: qty 50

## 2019-10-11 MED ORDER — OMEPRAZOLE 20 MG PO CPDR
20.0000 mg | DELAYED_RELEASE_CAPSULE | Freq: Every day | ORAL | 0 refills | Status: DC
Start: 2019-10-11 — End: 2019-12-06

## 2019-10-11 MED ORDER — SUCRALFATE 1 GM/10ML PO SUSP
1.0000 g | Freq: Once | ORAL | Status: AC
  Administered 2019-10-11: 1 g via ORAL
  Filled 2019-10-11: qty 10

## 2019-10-11 MED ORDER — SUCRALFATE 1 G PO TABS
1.0000 g | ORAL_TABLET | Freq: Three times a day (TID) | ORAL | 0 refills | Status: DC
Start: 2019-10-11 — End: 2022-01-15

## 2019-10-11 NOTE — ED Provider Notes (Signed)
[facility] DEPT Provider Note   CSN: [id number] Arrival date & time: 10/11/19  4854     History Chief Complaint  Patient presents with  . Hematemesis    Derrick Gonzales is a 49 y.o. male.  HPI    Patient presents after episode of hematemesis and abdominal pain.  Patient was in his usual state of health, without notable recent events, when he had essentially sudden onset pain in the epigastrium, subsequently had an episode of vomiting, with gross blood. No near syncope, syncope, no chest pain, no other abdominal pain.  He had no additional episodes of hematemesis.  He now states that he feels generally well, has no focal complaints, but with concerns as above for that episode he presents for evaluation. Patient works moving heavy equipment, smokes, drinks. Past Medical History:  Diagnosis Date  . Chest pain on exertion 12/06/2011  . Diabetes mellitus   . Morbid obesity with BMI of 45.0-49.9, adult ([employer]) 12/06/2011    Patient Active Problem List   Diagnosis Date Noted  . Syncope 09/17/2019  . Rib pain on right side 01/27/2019  . Need for immunization against influenza 01/27/2018  . Hypercholesterolemia 08/25/2017  . Erectile dysfunction 07/22/2017  . DM2 (diabetes mellitus, type 2) ([place]) 09/15/2014  . Morbid obesity with BMI of 45.0-49.9, adult ([place]) 12/06/2011    History reviewed. No pertinent surgical history.     Family History  Problem Relation Age of Onset  . Coronary artery disease Father   . Heart disease Father   . Diabetes Father   . Hypertension Mother   . Coronary artery disease Other     Social History   Tobacco Use  . Smoking status: Former Smoker    Quit date: 11/01/2011    Years since quitting: 7.9  . Smokeless tobacco: Never Used  . Tobacco comment: vaping with zero nicotine  Substance Use Topics  . Alcohol use: No    Alcohol/week: 0.0 standard drinks  . Drug use: No    Home Medications Prior to  Admission medications   Medication Sig Start Date End Date Taking? Authorizing Provider  empagliflozin (JARDIANCE) 25 MG TABS tablet Take 25 mg by mouth daily. 01/25/19 01/20/20 Yes Bland, Scott, DO  insulin glargine (LANTUS) 100 UNIT/ML Solostar Pen Inject 16 Units into the skin every morning. Increase by 2 units each day that fasting morning glucose is over 200.  If under 100, do not take, call doctor immediately 09/29/19 10/29/19 Yes Koval, Monico Blitz, RPH-CPP  liraglutide (VICTOZA) 18 MG/3ML SOPN Inject 0.2 mLs (1.2 mg total) into the skin daily. 09/29/19  Yes Leavy [name], RPH-CPP  metFORMIN (GLUCOPHAGE-XR) 500 MG 24 hr tablet TAKE 1 TABLET(500 MG) BY MOUTH DAILY WITH BREAKFAST Patient taking differently: Take 500 mg by mouth daily with breakfast.  08/12/19  Yes Bland, Scott, DO  sildenafil (REVATIO) 20 MG tablet Take 1 tablet (20 mg total) by mouth daily as needed. 09/16/19 10/16/19 Yes Bland, Scott, DO  simvastatin (ZOCOR) 20 MG tablet Take 1 tablet (20 mg total) by mouth at bedtime. 01/25/19  Yes Sherene Sires, DO  Continuous Blood Gluc Receiver (DEXCOM G6 RECEIVER) DEVI 1 Device by Does not apply route as directed. Use with Dexcom G6 sensor and transmitter 09/29/19   Leavy [name], RPH-CPP  Continuous Blood Gluc Sensor (DEXCOM G6 SENSOR) MISC Inject 1 applicator into the skin as directed. Change sensor every 10 days. 09/29/19   Leavy [name], RPH-CPP  Continuous Blood Gluc Transmit (DEXCOM  G6 TRANSMITTER) MISC Inject 1 Device into the skin as directed. Reuse 8 times with sensor changes. 09/29/19   Leavy [name], RPH-CPP  glucose blood test strip Use as instructed 06/22/14   Chari Manning A, NP  glucose monitoring kit (FREESTYLE) monitoring kit 1 each by Does not apply route as needed. 06/22/14   [name], NP  Insulin Pen Needle (PEN NEEDLES) 32G X 6 MM MISC 0.6 mLs by Does not apply route daily. 01/25/19   Sherene Sires, DO  Lancets (FREESTYLE) lancets Use as instructed 06/22/14   [name], NP     Allergies    Atorvastatin  Review of Systems   Review of Systems  Constitutional:       Per HPI, otherwise negative  HENT:       Per HPI, otherwise negative  Respiratory:       Per HPI, otherwise negative  Cardiovascular:       Per HPI, otherwise negative  Gastrointestinal: Positive for abdominal pain, nausea and vomiting.  Endocrine:       Negative aside from HPI  Genitourinary:       Neg aside from HPI   Musculoskeletal:       Per HPI, otherwise negative  Skin: Negative.   Neurological: Negative for syncope.    Physical Exam Updated Vital Signs BP (!) 165/86 (BP Location: Left Arm)   Pulse 88   Temp 98.9 F (37.2 C) (Oral)   Resp 19   SpO2 100%   Physical Exam Vitals and nursing note reviewed.  Constitutional:      General: He is not in acute distress.    Appearance: He is well-developed. He is obese.  HENT:     Head: Normocephalic and atraumatic.  Eyes:     Conjunctiva/sclera: Conjunctivae normal.  Cardiovascular:     Rate and Rhythm: Normal rate and regular rhythm.  Pulmonary:     Effort: Pulmonary effort is normal. No respiratory distress.     Breath sounds: No stridor.  Abdominal:     General: There is no distension.     Tenderness: There is no abdominal tenderness. There is no guarding or rebound.  Skin:    General: Skin is warm and dry.  Neurological:     Mental Status: He is alert and oriented to person, place, and time.     ED Results / Procedures / Treatments   Labs (all labs ordered are listed, but only abnormal results are displayed) Labs Reviewed  COMPREHENSIVE METABOLIC PANEL - Abnormal; Notable for the following components:      Result Value   Glucose, Bld 169 (*)    Calcium 8.7 (*)    All other components within normal limits  CBC  ETHANOL  LIPASE, BLOOD  TYPE AND SCREEN  ABO/RH    EKG None  Radiology No results found.  Procedures Procedures (including critical care time)  Medications Ordered in ED Medications   sucralfate (CARAFATE) 1 GM/10ML suspension 1 g (1 g Oral Given 10/11/19 1053)  famotidine (PEPCID) IVPB 20 mg premix (0 mg Intravenous Stopped 10/11/19 1123)    ED Course  I have reviewed the triage vital signs and the nursing notes.  Pertinent labs & imaging results that were available during my care of the patient were reviewed by me and considered in my medical decision making (see chart for details).    MDM Rules/Calculators/A&P  12:14 PM Patient in no distress, sitting upright, speaking clearly. We discussed findings, including reassuring labs, vitals.  He has had no additional episodes of nausea, vomiting, abdominal pain.  I do some suspicion for either acute gas enteritis versus food reaction versus early ulcerative disease.  No evidence for perforation, bacteremia, distress, or other indication for admission. Patient amenable to starting meds, following up with GI in the coming days for likely endoscopy.  Final Clinical Impression(s) / ED Diagnoses Final diagnoses:  Hematemesis with nausea    Rx / DC Orders ED Discharge Orders         Ordered    omeprazole (PRILOSEC) 20 MG capsule  Daily     10/11/19 1216    sucralfate (CARAFATE) 1 g tablet  3 times daily with meals & bedtime    Note to Pharmacy: Take for one week   10/11/19 1216           Carmin Muskrat, MD 10/11/19 1216

## 2019-10-11 NOTE — Discharge Instructions (Signed)
As discussed, your evaluation today has been largely reassuring.  But, it is important that you monitor your condition carefully, and do not hesitate to return to the ED if you develop new, or concerning changes in your condition. ? ?Otherwise, please follow-up with our physicians for appropriate ongoing care. ? ?

## 2019-10-12 ENCOUNTER — Encounter: Payer: Self-pay | Admitting: Family Medicine

## 2019-10-14 ENCOUNTER — Telehealth: Payer: Self-pay

## 2019-10-14 NOTE — Telephone Encounter (Signed)
Received fax from pharmacy requesting PA on Dexcom 6 and supplies. I have placed form in providers box for review.

## 2019-10-20 NOTE — Telephone Encounter (Signed)
Form was faxed yesterday evening when the fax machine was back up.

## 2019-10-20 NOTE — Telephone Encounter (Signed)
Completed prior authorization form.

## 2019-10-26 ENCOUNTER — Encounter: Payer: Self-pay | Admitting: Family Medicine

## 2019-10-28 ENCOUNTER — Encounter: Payer: Self-pay | Admitting: Family Medicine

## 2019-10-28 ENCOUNTER — Other Ambulatory Visit: Payer: Self-pay | Admitting: Family Medicine

## 2019-10-28 DIAGNOSIS — E1165 Type 2 diabetes mellitus with hyperglycemia: Secondary | ICD-10-CM

## 2019-10-29 ENCOUNTER — Other Ambulatory Visit: Payer: Self-pay | Admitting: Family Medicine

## 2019-10-29 DIAGNOSIS — N529 Male erectile dysfunction, unspecified: Secondary | ICD-10-CM

## 2019-10-29 MED ORDER — SILDENAFIL CITRATE 20 MG PO TABS: 20.0000 mg | ORAL_TABLET | Freq: Every day | ORAL | 0 refills | Status: DC | PRN

## 2019-10-31 ENCOUNTER — Other Ambulatory Visit: Payer: Self-pay | Admitting: Family Medicine

## 2019-10-31 DIAGNOSIS — N529 Male erectile dysfunction, unspecified: Secondary | ICD-10-CM

## 2019-10-31 MED ORDER — SILDENAFIL CITRATE 20 MG PO TABS: 20.0000 mg | ORAL_TABLET | Freq: Every day | ORAL | 0 refills | Status: AC | PRN

## 2019-10-31 NOTE — Progress Notes (Signed)
Sildenafil sent to [facility] at patient's request as opposed to [facility]  Dr. Parke Simmers

## 2019-11-17 ENCOUNTER — Other Ambulatory Visit: Payer: Self-pay | Admitting: Family Medicine

## 2019-11-17 DIAGNOSIS — E118 Type 2 diabetes mellitus with unspecified complications: Secondary | ICD-10-CM

## 2019-12-06 ENCOUNTER — Ambulatory Visit (INDEPENDENT_AMBULATORY_CARE_PROVIDER_SITE_OTHER): Payer: Medicaid Other | Admitting: Family Medicine

## 2019-12-06 ENCOUNTER — Other Ambulatory Visit: Payer: Self-pay

## 2019-12-06 ENCOUNTER — Encounter: Payer: Self-pay | Admitting: Family Medicine

## 2019-12-06 VITALS — BP 150/88 | HR 88 | Wt 269.4 lb

## 2019-12-06 DIAGNOSIS — E1165 Type 2 diabetes mellitus with hyperglycemia: Secondary | ICD-10-CM | POA: Diagnosis not present

## 2019-12-06 DIAGNOSIS — R8290 Unspecified abnormal findings in urine: Secondary | ICD-10-CM | POA: Insufficient documentation

## 2019-12-06 LAB — POCT URINALYSIS DIP (MANUAL ENTRY)
Bilirubin, UA: NEGATIVE
Blood, UA: NEGATIVE
Glucose, UA: 500 mg/dL — AB
Ketones, POC UA: NEGATIVE mg/dL
Leukocytes, UA: NEGATIVE
Nitrite, UA: NEGATIVE
Protein Ur, POC: NEGATIVE mg/dL
Spec Grav, UA: 1.01 (ref 1.010–1.025)
Urobilinogen, UA: 0.2 E.U./dL
pH, UA: 5 (ref 5.0–8.0)

## 2019-12-06 LAB — POCT GLYCOSYLATED HEMOGLOBIN (HGB A1C): HbA1c, POC (controlled diabetic range): 8.7 % — AB (ref 0.0–7.0)

## 2019-12-06 NOTE — Patient Instructions (Addendum)
Good to see you today - Thanks for coming in  For the Blood sugar   Keep taking all your medications and increasing lantus depending on your fasting blood sugar  We will check blood test and I will let you know via My Chart    For the Back Pain  We will check a urine culture  Use tylenol as needed for pain  If you get high fever or persistent nausea and vomiting then go to the ER    Please always bring your medication bottles  Come back to Dr Raymondo Band or Dr Obie Dredge in a few weeks

## 2019-12-06 NOTE — Progress Notes (Signed)
    SUBJECTIVE:   CHIEF COMPLAINT / HPI:   HIGH BLOOD SUGAR For last 2 weeks noticed his blood sugar is > 200.  Has increased his lantus from 16 to 30 units per instructions.  Taking all his other medications as per his list (did not bring medication bottles but has pics in his phone)  Has gained 2 lbs but no significant change in intake or exercise   BACK PAIN FOUL URINE For last month having bilateral low back pain and foul smelling urine.  No dysuria or bleeding.  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 Alert, communicative  and cooperative with normal attention span and concentration. No apparent delusions, illusions, hallucinations Extremities:  No cyanosis, edema, or deformity noted with good range of motion of all major joints.   Lungs:  Normal respiratory effort, chest expands symmetrically. Lungs are clear to auscultation, no crackles or wheezes. Abdomen: soft and non-tender without masses, organomegaly or hernias noted.  No guarding or rebound No CVAT Skin - no signs of infection   ASSESSMENT/PLAN:   DM2 (diabetes mellitus, type 2) (HCC) Worsening control.  No obvious cause unless he has a uti on culture (pending)  Continue to increase lantus as he is Quick follow up with pharmacy or PCP   Foul smelling urine UA is unrevealing.  Will check urine culture and treat if uti.  Maybe cause of elevated glucose      Carney Living, MD [facility]

## 2019-12-07 LAB — CBC
Hematocrit: 45.3 % (ref 37.5–51.0)
Hemoglobin: 15.3 g/dL (ref 13.0–17.7)
MCH: 30.4 pg (ref 26.6–33.0)
MCHC: 33.8 g/dL (ref 31.5–35.7)
MCV: 90 fL (ref 79–97)
Platelets: 229 10*3/uL (ref 150–450)
RBC: 5.04 x10E6/uL (ref 4.14–5.80)
RDW: 12.4 % (ref 11.6–15.4)
WBC: 10.1 10*3/uL (ref 3.4–10.8)

## 2019-12-07 LAB — BASIC METABOLIC PANEL
BUN/Creatinine Ratio: 30 — ABNORMAL HIGH (ref 9–20)
BUN: 22 mg/dL (ref 6–24)
CO2: 21 mmol/L (ref 20–29)
Calcium: 9.3 mg/dL (ref 8.7–10.2)
Chloride: 102 mmol/L (ref 96–106)
Creatinine, Ser: 0.73 mg/dL — ABNORMAL LOW (ref 0.76–1.27)
GFR calc Af Amer: 128 mL/min/{1.73_m2} (ref >59)
GFR calc non Af Amer: 110 mL/min/{1.73_m2} (ref >59)
Glucose: 233 mg/dL — ABNORMAL HIGH (ref 65–99)
Potassium: 4.1 mmol/L (ref 3.5–5.2)
Sodium: 139 mmol/L (ref 134–144)

## 2019-12-07 NOTE — Addendum Note (Signed)
Addended by: Pearlean Brownie L on: 12/07/2019 02:36 PM   Modules accepted: Orders

## 2019-12-07 NOTE — Assessment & Plan Note (Signed)
Worsening control.  No obvious cause unless he has a uti on culture (pending)  Continue to increase lantus as he is Quick follow up with pharmacy or PCP

## 2019-12-07 NOTE — Assessment & Plan Note (Signed)
UA is unrevealing.  Will check urine culture and treat if uti.  Maybe cause of elevated glucose

## 2019-12-08 ENCOUNTER — Encounter: Payer: Self-pay | Admitting: Family Medicine

## 2019-12-08 ENCOUNTER — Telehealth: Payer: Self-pay | Admitting: Family Medicine

## 2019-12-08 NOTE — Telephone Encounter (Signed)
Spoke w him about need for urine sample for culture Will send written my chart message for his work

## 2020-01-02 ENCOUNTER — Encounter: Payer: Self-pay | Admitting: Family Medicine

## 2020-01-02 ENCOUNTER — Ambulatory Visit (INDEPENDENT_AMBULATORY_CARE_PROVIDER_SITE_OTHER): Payer: Medicaid Other | Admitting: Family Medicine

## 2020-01-02 ENCOUNTER — Other Ambulatory Visit: Payer: Self-pay

## 2020-01-02 VITALS — BP 130/76 | HR 93 | Ht 66.0 in | Wt 270.8 lb

## 2020-01-02 DIAGNOSIS — E7800 Pure hypercholesterolemia, unspecified: Secondary | ICD-10-CM

## 2020-01-02 DIAGNOSIS — Z720 Tobacco use: Secondary | ICD-10-CM

## 2020-01-02 DIAGNOSIS — Z1211 Encounter for screening for malignant neoplasm of colon: Secondary | ICD-10-CM

## 2020-01-02 DIAGNOSIS — N529 Male erectile dysfunction, unspecified: Secondary | ICD-10-CM

## 2020-01-02 DIAGNOSIS — E1165 Type 2 diabetes mellitus with hyperglycemia: Secondary | ICD-10-CM

## 2020-01-02 MED ORDER — SILDENAFIL CITRATE 50 MG PO TABS: 50.0000 mg | ORAL_TABLET | Freq: Every day | ORAL | 0 refills | Status: DC | PRN

## 2020-01-02 MED ORDER — LIRAGLUTIDE 18 MG/3ML ~~LOC~~ SOPN: 1.8000 mg | PEN_INJECTOR | Freq: Every day | SUBCUTANEOUS | 3 refills | Status: DC

## 2020-01-02 MED ORDER — LANTUS SOLOSTAR 100 UNIT/ML ~~LOC~~ SOPN: 24.0000 [IU] | PEN_INJECTOR | Freq: Every day | SUBCUTANEOUS | 3 refills | Status: DC

## 2020-01-02 MED ORDER — LISINOPRIL 2.5 MG PO TABS: 2.5000 mg | ORAL_TABLET | Freq: Every day | ORAL | 3 refills | Status: DC

## 2020-01-02 NOTE — Patient Instructions (Addendum)
Thank you for coming to see me today. It was a pleasure. Today we talked about:   We will start the lisinopril at the lowest dose for kidney protection.  We will recheck your labs when you come back in 1 month.  Increase your Victoza to 1.8mg .  We will get some labs today (cholesterol).  If they are abnormal or we need to do something about them, I will call you.  If they are normal, I will send you a message on MyChart (if it is active) or a letter in the mail.  If you don't hear from [name] in 2 weeks, please call the office at the number below.  We have increased your viagra.  If this makes you feel bad, let me know right away and stop taking it.  Start checking your blood sugar at night as well.  You are due for a colonoscopy.  Please use the form that we have given you to schedule this at your convenience.   Call 1800-QUIT-NOW for help with stopping smoking. They can assist with free resources such as patches, check-in calls, and counseling.   Please follow-up with me in 1-2 months or sooner as need  If you have any questions or concerns, please do not hesitate to call the office at [phone].  Best,   Luis Abed, DO   Diet Recommendations for Diabetes  Carbohydrate includes starch, sugar, and fiber.  Of these, only sugar and starch raise blood glucose.  (Fiber is found in fruits, vegetables [especially skin, seeds, and stalks], whole grains, and beans.)   Starchy (carb) foods: Bread, rice, pasta, potatoes, corn, cereal, grits, crackers, bagels, muffins, all baked goods.  (Fruit, milk, and yogurt also have carbohydrate, but most of these foods will not spike your blood sugar as most starchy foods will.)  A few fruits do cause high blood sugars; use small portions of bananas (limit to 1/2 at a time), grapes, watermelon, oranges, and most tropical fruits.   Protein foods: Meat, fish, poultry, eggs, dairy foods, and beans such as pinto and kidney beans (beans also provide  carbohydrate).   1. Eat at least 3 REAL meals and 1-2 snacks per day. Eat breakfast within the first hour of getting up.  Have something to eat at least every 5 hours while awake.   2. Limit starchy foods to TWO per meal and ONE per snack. ONE portion of a starchy food is equal to the following:   - ONE slice of bread (or its equivalent, such as half of a hamburger bun).   - 1/2 cup of a "scoopable" starchy food such as potatoes or rice.   - 15 grams of Total Carbohydrate as shown on food label.   - Every 4 ounces of a sweet drink (including fruit juice). 3. Include at every lunch and dinner: a protein food, a carb food, and vegetables.   - Obtain twice the volume of veg's as protein or carbohydrate foods for both lunch and dinner.   - Fresh or frozen vegetables are best.   - Keep frozen vegetables on hand for a quick option.

## 2020-01-02 NOTE — Progress Notes (Signed)
SUBJECTIVE:   CHIEF COMPLAINT / HPI:   T2DM Current regimen: Metformin XR 500 mg daily, Victoza 1.2 mg daily, Lantus 24u QAM, Jardiance 25 mg daily Has been on higher metformin  CBGs: usually 160-mid 200s, rare in 300s checks in the AM fasting Last A1c 8.7 on 12/06/2019, seen by Dr. Deirdre Priest and following with pharmacy Never been on ACE/ARB Last BMP 12/06/2019 Feels like his decrease in CBGs has reached a plateau Had spoken with pharmacy 2-3 weeks ago about Dexacom and that they were needing a prior auth Looks like prior auth was faxed on 6/2 Needs seen for an eye exam, has been seen at [facility]  HLD Last lipid panel 08/24/2017, LDL 129 Currently on simvastatin 20 mg Denies chest pain, shortness of breath, leg cramps Previously had migraine headaches with atorvastatin Still currently smoking Had quit 4 years, but started again about a year ago Father had heart attack in 80s  Erectile Dysfunction Patient reports a history of erectile dysfunction, was receiving sildenafil from previous PCP He was on 20 mg dose, noted that sometimes it would help, other times it would not Continues to have difficulty maintaining erection  Tobacco abuse Patient reports that he is currently smoking Had quit 4 years ago but then started again about a year ago States that he was previously able to stop cold [place] and thinks that this will be most successful for him   PERTINENT  PMH / PSH: Morbid obesity, type 2 diabetes, HLD   OBJECTIVE:   BP 130/76   Pulse 93   Ht 5\' 6"  (1.676 m)   Wt 270 lb 12.8 oz (122.8 kg)   SpO2 97%   BMI 43.71 kg/m    Physical Exam:  General: 49 y.o. male in NAD Cardio: RRR no m/r/g Lungs: CTAB, no wheezing, no rhonchi, no crackles, no IWOB on RA Skin: warm and dry Extremities: No edema BLE   ASSESSMENT/PLAN:   DM2 (diabetes mellitus, type 2) (HCC) A1c 8.7 on 12/06/2019.  CBGs not at goal, on meter, appears to mostly be in 160 to mid 200s range, few in  300s.  He is checking in the morning, also taking Lantus in the morning.  He has not tolerated higher doses of Metformin in the past due to diarrhea.  Therefore will increase Victoza to 1.8 mg and patient can continue to titrate Lantus as he was previously doing by increasing 2 units every morning that his fasting is not greater than 200.Also given diabetic diet education.  Started on lisinopril for renal protection.  Given that he just had BMP checked, will hold off on rechecking today for baseline.  Will recheck BMP in 1 month.  Hypercholesterolemia Last lipid panel 2019.  Will recheck today and determine if change in statin needed, continue with simvastatin 20mg  for ow.  Encouraged smoking cessation and healthy diet especially with family history.  Denies chest pain.  Could consider cardiac referral as well in the future given family history.  Erectile dysfunction Patient was on lower dose of sildenafil than typical for ED.  Has been tolerating well.  Will increase to standard ED dosing of 50mg .    Tobacco abuse Discussed cessation.  Patient reports that he has quick cold 2020 previously and thinks he could do it again.  Encouraged cessation especially given family history of heart attack in father in 96s.  Patient seems motivated.  Also given .   Colonoscopy form given.  [place], DO [facility]  [place]

## 2020-01-03 ENCOUNTER — Other Ambulatory Visit: Payer: Self-pay | Admitting: Family Medicine

## 2020-01-03 DIAGNOSIS — E7800 Pure hypercholesterolemia, unspecified: Secondary | ICD-10-CM

## 2020-01-03 DIAGNOSIS — Z720 Tobacco use: Secondary | ICD-10-CM | POA: Insufficient documentation

## 2020-01-03 LAB — LIPID PANEL
Chol/HDL Ratio: 3.4 ratio (ref 0.0–5.0)
Cholesterol, Total: 171 mg/dL (ref 100–199)
HDL: 51 mg/dL (ref >39)
LDL Chol Calc (NIH): 98 mg/dL (ref 0–99)
Triglycerides: 125 mg/dL (ref 0–149)
VLDL Cholesterol Cal: 22 mg/dL (ref 5–40)

## 2020-01-03 MED ORDER — ROSUVASTATIN CALCIUM 40 MG PO TABS: 40.0000 mg | ORAL_TABLET | Freq: Every day | ORAL | 3 refills | Status: DC

## 2020-01-03 NOTE — Progress Notes (Signed)
Rx sent for crestor as allergic to lipitor.  D/c'ed zocor.

## 2020-01-03 NOTE — Assessment & Plan Note (Signed)
A1c 8.7 on 12/06/2019.  CBGs not at goal, on meter, appears to mostly be in 160 to mid 200s range, few in 300s.  He is checking in the morning, also taking Lantus in the morning.  He has not tolerated higher doses of Metformin in the past due to diarrhea.  Therefore will increase Victoza to 1.8 mg and patient can continue to titrate Lantus as he was previously doing by increasing 2 units every morning that his fasting is not greater than 200.Also given diabetic diet education.  Started on lisinopril for renal protection.  Given that he just had BMP checked, will hold off on rechecking today for baseline.  Will recheck BMP in 1 month.

## 2020-01-04 NOTE — Assessment & Plan Note (Signed)
Last lipid panel 2019.  Will recheck today and determine if change in statin needed, continue with simvastatin 20mg  for ow.  Encouraged smoking cessation and healthy diet especially with family history.  Denies chest pain.  Could consider cardiac referral as well in the future given family history.

## 2020-01-04 NOTE — Assessment & Plan Note (Signed)
Patient was on lower dose of sildenafil than typical for ED.  Has been tolerating well.  Will increase to standard ED dosing of 50mg .

## 2020-01-04 NOTE — Assessment & Plan Note (Signed)
Discussed cessation.  Patient reports that he has quick cold [place] previously and thinks he could do it again.  Encouraged cessation especially given family history of heart attack in father in 40s.  Patient seems motivated.  Also given [employer].

## 2020-01-12 ENCOUNTER — Telehealth: Payer: Self-pay

## 2020-01-12 NOTE — Telephone Encounter (Signed)
Spoke with pharmacist who informed me Dexcom was never approved or denied back in May, despite the PA that was submitted on 05/28. I have resubmitted the PA via covermymeds. See below for details.  Received fax from pharmacy, PA needed on Dexcom 6. Clinical questions submitted via Cover My Meds. Waiting on response, could take up to 72 hours.  Cover My Meds info: Key: [id number]

## 2020-01-16 ENCOUNTER — Telehealth: Payer: Self-pay

## 2020-01-16 DIAGNOSIS — E1165 Type 2 diabetes mellitus with hyperglycemia: Secondary | ICD-10-CM

## 2020-01-16 NOTE — Telephone Encounter (Signed)
Received fax from pharmacy, PA needed on [employer]. Clinical questions submitted via Cover My Meds. Waiting on response, could take up to 72 hours.  Cover My Meds info: Key: [id number]

## 2020-01-17 NOTE — Telephone Encounter (Signed)
So, the receiver was approved via new medicaid plan (see separate note.) However, the sensors required a PA as well. See below for response. I called the pharmacy to inform them of response, however still stating the need a PA. At this point I do not know what to do. I have never had to do two separate PAs for the receiver and sensor. Will forward to Pharmacy team.

## 2020-01-17 NOTE — Telephone Encounter (Signed)
Dexcom 6 Receiver has been approved. Pharmacy has been updated.

## 2020-01-19 NOTE — Telephone Encounter (Signed)
I spoke with patients insurance who stated, "not sure why we did not process sensor pa correctly." Per insurance, the sensor pa was cancelled. Insurance stated they will reinstate right away and will have a response in 4 hours. I will call them tomorrow.   [phone]

## 2020-01-20 ENCOUNTER — Other Ambulatory Visit: Payer: Self-pay | Admitting: *Deleted

## 2020-01-20 DIAGNOSIS — E118 Type 2 diabetes mellitus with unspecified complications: Secondary | ICD-10-CM

## 2020-01-20 MED ORDER — METFORMIN HCL ER 500 MG PO TB24: 500.0000 mg | ORAL_TABLET | Freq: Every day | ORAL | 3 refills | Status: DC

## 2020-01-25 NOTE — Telephone Encounter (Signed)
Late entry...  I called patients insurance last week to follow-up with PA. Sensors were approved and pharmacy has been updated. All Dexcom materials went through with a 3 dollar copay.

## 2020-01-26 NOTE — Assessment & Plan Note (Signed)
Patient contacted RE Dexcom supplies.    He reports he has been able to set -up device without assistance.    He did not have any questions related to his Dexcom.   However, he expressed concern that his blood sugars are continuing to be > 200 fasting despite increasing his Lantus to 36 units once daily.   Following some discussion he agreed to increase to 46 units tomorrow AM and continue the gradual dose escalation.  He verbalized understanding of this treatment plan.    I plan to call him in 1 week to reassess and potentially readjust basal insulin dose.  

## 2020-01-26 NOTE — Telephone Encounter (Signed)
Patient contacted RE Dexcom supplies.    He reports he has been able to set -up device without assistance.    He did not have any questions related to his Dexcom.   However, he expressed concern that his blood sugars are continuing to be > 200 fasting despite increasing his Lantus to 36 units once daily.   Following some discussion he agreed to increase to 46 units tomorrow AM and continue the gradual dose escalation.  He verbalized understanding of this treatment plan.    I plan to call him in 1 week to reassess and potentially readjust basal insulin dose.

## 2020-01-29 LAB — HM DIABETES EYE EXAM

## 2020-02-06 ENCOUNTER — Telehealth: Payer: Self-pay | Admitting: Pharmacist

## 2020-02-06 DIAGNOSIS — R0602 Shortness of breath: Secondary | ICD-10-CM | POA: Diagnosis not present

## 2020-02-06 DIAGNOSIS — Z20822 Contact with and (suspected) exposure to covid-19: Secondary | ICD-10-CM | POA: Diagnosis not present

## 2020-02-06 DIAGNOSIS — E1165 Type 2 diabetes mellitus with hyperglycemia: Secondary | ICD-10-CM

## 2020-02-06 DIAGNOSIS — R05 Cough: Secondary | ICD-10-CM | POA: Diagnosis not present

## 2020-02-06 DIAGNOSIS — J111 Influenza due to unidentified influenza virus with other respiratory manifestations: Secondary | ICD-10-CM | POA: Diagnosis not present

## 2020-02-06 MED ORDER — LANTUS SOLOSTAR 100 UNIT/ML ~~LOC~~ SOPN: 30.0000 [IU] | PEN_INJECTOR | Freq: Every day | SUBCUTANEOUS | Status: DC

## 2020-02-06 NOTE — Telephone Encounter (Signed)
Noted and agree. 

## 2020-02-06 NOTE — Telephone Encounter (Signed)
-----   Message from Kathrin Ruddy, RPH-CPP sent at 01/26/2020  4:44 PM EDT ----- Regarding: Diabetes insulin dose adjustment

## 2020-02-06 NOTE — Telephone Encounter (Signed)
Contacted patient in follow-up of blood glucose control.   Patient taking Lantus and Victoza.    Patient reports current blood sugar is 157mg /dl (patient continues to use DexCom).   He states he had several low readings after increasing his insluin to 46 units (after our last call).  He has been able to reduce his insulin to less than he was using (36 units) at most recent call.   He is happy with his current level of control.  We discussed high (250) and low(70) alarm settings.   NOTE:  Patient is currently awaiting evaluation in the Emergency Room for covid-like illness evaluation.  He complains of chest-tightness, congestion but denies fever.   He denies having a vaccination.   I encouraged him to contact me or his PCP with additional questions RE his blood sugars/insulin regimen as needed in the near future and hoped he will recover quickly.  He expressed appreciation for the call.    I plan no additional, phone call at this time.  Appears his blood sugar control (Lantus / Victoza) and Dexcom are effective.

## 2020-02-08 ENCOUNTER — Ambulatory Visit: Payer: Medicaid Other | Admitting: Family Medicine

## 2020-02-16 ENCOUNTER — Other Ambulatory Visit: Payer: Self-pay | Admitting: *Deleted

## 2020-02-16 NOTE — Telephone Encounter (Signed)
Error, was for prior authorization. Derrick Gonzales, CMA

## 2020-02-18 ENCOUNTER — Encounter: Payer: Self-pay | Admitting: Family Medicine

## 2020-02-21 ENCOUNTER — Other Ambulatory Visit: Payer: Self-pay | Admitting: Family Medicine

## 2020-02-21 ENCOUNTER — Telehealth: Payer: Self-pay | Admitting: Family Medicine

## 2020-02-21 DIAGNOSIS — E1165 Type 2 diabetes mellitus with hyperglycemia: Secondary | ICD-10-CM

## 2020-02-21 DIAGNOSIS — E7800 Pure hypercholesterolemia, unspecified: Secondary | ICD-10-CM

## 2020-02-21 MED ORDER — EMPAGLIFLOZIN 25 MG PO TABS: 25.0000 mg | ORAL_TABLET | Freq: Every day | ORAL | 3 refills | Status: DC

## 2020-02-21 MED ORDER — ROSUVASTATIN CALCIUM 40 MG PO TABS: 40.0000 mg | ORAL_TABLET | Freq: Every day | ORAL | 3 refills | Status: DC

## 2020-02-21 NOTE — Progress Notes (Signed)
Patient requesting statin and jardiance refill.  Jardiance no longer on med list, but was not discontinued.  We will go ahead and refill at 25 mg which she was on at last office visit.  He was changed from simvastatin to rosuvastatin at last visit after results from lipid panel showed he needs to be on higher intensity statin.  Rx sent for Crestor.

## 2020-02-21 NOTE — Telephone Encounter (Signed)
Called patient regarding requests for medications.  No answer, left VM to call back.  Advised patient to please call back or send my chart message clarifying his medications as he was requesting simvastatin, but chart states he is on rosuvastatin, and asked for "jordans" which is likely a typo.  If calls back, please clarify the medications that he needs.

## 2020-02-22 DIAGNOSIS — H5213 Myopia, bilateral: Secondary | ICD-10-CM | POA: Diagnosis not present

## 2020-02-29 ENCOUNTER — Other Ambulatory Visit: Payer: Self-pay | Admitting: Family Medicine

## 2020-02-29 ENCOUNTER — Encounter: Payer: Self-pay | Admitting: Family Medicine

## 2020-02-29 DIAGNOSIS — E1165 Type 2 diabetes mellitus with hyperglycemia: Secondary | ICD-10-CM

## 2020-02-29 MED ORDER — LIRAGLUTIDE 18 MG/3ML ~~LOC~~ SOPN: 1.8000 mg | PEN_INJECTOR | Freq: Every day | SUBCUTANEOUS | 3 refills | Status: DC

## 2020-03-05 ENCOUNTER — Other Ambulatory Visit: Payer: Self-pay | Admitting: Family Medicine

## 2020-03-05 DIAGNOSIS — E1165 Type 2 diabetes mellitus with hyperglycemia: Secondary | ICD-10-CM

## 2020-03-05 MED ORDER — LIRAGLUTIDE 18 MG/3ML ~~LOC~~ SOPN: 1.8000 mg | PEN_INJECTOR | Freq: Every day | SUBCUTANEOUS | 3 refills | Status: DC

## 2020-03-07 ENCOUNTER — Other Ambulatory Visit: Payer: Self-pay

## 2020-03-07 ENCOUNTER — Ambulatory Visit (INDEPENDENT_AMBULATORY_CARE_PROVIDER_SITE_OTHER): Payer: Medicaid Other | Admitting: Family Medicine

## 2020-03-07 ENCOUNTER — Encounter: Payer: Self-pay | Admitting: Family Medicine

## 2020-03-07 VITALS — BP 110/64 | HR 83 | Ht 66.0 in | Wt 270.4 lb

## 2020-03-07 DIAGNOSIS — Z720 Tobacco use: Secondary | ICD-10-CM | POA: Diagnosis not present

## 2020-03-07 DIAGNOSIS — N529 Male erectile dysfunction, unspecified: Secondary | ICD-10-CM | POA: Diagnosis not present

## 2020-03-07 DIAGNOSIS — E7800 Pure hypercholesterolemia, unspecified: Secondary | ICD-10-CM

## 2020-03-07 DIAGNOSIS — W19XXXA Unspecified fall, initial encounter: Secondary | ICD-10-CM | POA: Insufficient documentation

## 2020-03-07 DIAGNOSIS — E1165 Type 2 diabetes mellitus with hyperglycemia: Secondary | ICD-10-CM | POA: Diagnosis not present

## 2020-03-07 LAB — POCT GLYCOSYLATED HEMOGLOBIN (HGB A1C): Hemoglobin A1C: 7.9 % — AB (ref 4.0–5.6)

## 2020-03-07 MED ORDER — SILDENAFIL CITRATE 50 MG PO TABS: 50.0000 mg | ORAL_TABLET | Freq: Every day | ORAL | 0 refills | Status: DC | PRN

## 2020-03-07 NOTE — Patient Instructions (Signed)
Thank you for coming to see me today. It was a pleasure. Today we talked about:   We will get some labs today.  If they are abnormal or we need to do something about them, I will call you.  If they are normal, I will send you a message on MyChart (if it is active) or a letter in the mail.  If you don't hear from [name] in 2 weeks, please call the office at the number below.  Call the [facility] to set up an appointment for weight management. Their number is [phone].  Please follow-up with me in 3 months.  If you have any questions or concerns, please do not hesitate to call the office at [phone].  Best,   Luis Abed, DO  Call 1800-QUIT-NOW for help with stopping smoking. They can assist with free resources such as patches, check-in calls, and counseling.

## 2020-03-07 NOTE — Assessment & Plan Note (Signed)
For now, patient would like to stay on Crestor 40 mg to see if his headaches will decrease.  Advised that if they worsen, to let this provider know before his next visit.  We will check a LDL today to ensure that his Crestor has lowered it appropriately.  Could consider lowering the dose if he cannot tolerate it, could also add Zetia to lower dose if needed.  He has been intolerant of Lipitor before due to migraines.

## 2020-03-07 NOTE — Assessment & Plan Note (Signed)
Patient is neurologically intact on examination today and has no indication for head imaging at this time.  His scalp laceration seems to be healing well.  No indication for closure with sutures or staples.  Advised to return to care if this happens again.

## 2020-03-07 NOTE — Assessment & Plan Note (Signed)
Patient continues to smoke.  He does have a desire to quit.  Asked if he would like further resources and stated that he just wanted some information on it to look at himself.  He was given quit line information.  Also stated that he can make an appointment with the pharmacy team if he would like to discuss further options for tobacco cessation.

## 2020-03-07 NOTE — Assessment & Plan Note (Signed)
A1c continues to improve.  He has been without Victoza for the last week due to prescription problem.  He is picking this up today.  He is adjusting his Lantus himself and very much likes using his Dexcom.  We will have him continue to adjust his Lantus as he is able and go back on Victoza.  We will check a BMP today given that he was started on lisinopril for renal protection in August.  He has had a recent diabetic eye exam.  Can perform a foot exam at next visit.

## 2020-03-07 NOTE — Assessment & Plan Note (Signed)
Refill provided for Viagra.

## 2020-03-07 NOTE — Progress Notes (Signed)
SUBJECTIVE:   CHIEF COMPLAINT / HPI:   T2DM Current regimen: Metformin XR 500 mg daily, Victoza 1.8 mg, Jardiance 25 mg daily, Lantus 34u There was an outage of prescribing service and he has been without it for a few days CBGs average 156 on Dexcom, 76% of the time his CBGs are in range He is loving the Dexcom Last A1c 8.7 on 12/06/2019, today is 7.9 Eye exam recently and states it was good He was started on lisinopril for renal protection on 8/16  Hyperlipidemia Last lipid panel on 01/02/2020 LDL at that time 98 He was changed to a high intensity statin, Crestor 40 mg Having some migraines that he thinks are related to this, but has continued to take it and is okay with continuing for now  Tobacco abuse Smoking 1 ppd  Tried to quit cold [place] but couldn't He is interested in quitting in the future  Fall yesterday Patient reports that yesterday he was trying to go up a hill in an electric wheelchair, ended up rolling backwards, fell back, hit the back of his head States that the time, he saw stars and his vision went black, but did not lose consciousness States that he did not have any confusion afterwards, no headache He was able to pick himself back up and had no issues He denies any headaches today, denies confusion, denies any numbness and tingling in his extremities, denies any weakness, denies any changes in vision He is on a blood thinner, does take a baby aspirin every day He just wanted to ensure that the back of his head is healing well  Erectile dysfunction Patient takes Viagra 40 mg as needed He is asking for refill on this States that has been working well, rarely does not work  PERTINENT  PMH / PSH: T2DM, erectile dysfunction, hyperlipidemia  OBJECTIVE:   BP 110/64   Pulse 83   Ht 5\' 6"  (1.676 m)   Wt 270 lb 6 oz (122.6 kg)   SpO2 98%   BMI 43.64 kg/m    Physical Exam:  General: 49 y.o. male in NAD HEENT: NCAT, EOMI Lungs: Breathing comfortably  on room air Skin: warm and dry, superficial excoriation on back of head, no open lesions, appears to be healing well Neuro: CN II-XII grossly intact, sensation intact BUE/BLE, ambulating without difficulty Extremities: No edema   Results for orders placed or performed in visit on 03/07/20 (from the past 24 hour(s))  HgB A1c     Status: Abnormal   Collection Time: 03/07/20  1:36 PM  Result Value Ref Range   Hemoglobin A1C 7.9 (A) 4.0 - 5.6 %   HbA1c POC (<> result, manual entry)     HbA1c, POC (prediabetic range)     HbA1c, POC (controlled diabetic range)       ASSESSMENT/PLAN:   DM2 (diabetes mellitus, type 2) (HCC) A1c continues to improve.  He has been without Victoza for the last week due to prescription problem.  He is picking this up today.  He is adjusting his Lantus himself and very much likes using his Dexcom.  We will have him continue to adjust his Lantus as he is able and go back on Victoza.  We will check a BMP today given that he was started on lisinopril for renal protection in August.  He has had a recent diabetic eye exam.  Can perform a foot exam at next visit.  Hypercholesterolemia For now, patient would like to stay on Crestor  40 mg to see if his headaches will decrease.  Advised that if they worsen, to let this provider know before his next visit.  We will check a LDL today to ensure that his Crestor has lowered it appropriately.  Could consider lowering the dose if he cannot tolerate it, could also add Zetia to lower dose if needed.  He has been intolerant of Lipitor before due to migraines.  Tobacco abuse Patient continues to smoke.  He does have a desire to quit.  Asked if he would like further resources and stated that he just wanted some information on it to look at himself.  He was given quit line information.  Also stated that he can make an appointment with the pharmacy team if he would like to discuss further options for tobacco cessation.  Fall Patient is  neurologically intact on examination today and has no indication for head imaging at this time.  His scalp laceration seems to be healing well.  No indication for closure with sutures or staples.  Advised to return to care if this happens again.  Erectile dysfunction Refill provided for Viagra.     Unknown Jim, DO [facility]

## 2020-03-08 LAB — BASIC METABOLIC PANEL
BUN/Creatinine Ratio: 19 (ref 9–20)
BUN: 13 mg/dL (ref 6–24)
CO2: 24 mmol/L (ref 20–29)
Calcium: 9.3 mg/dL (ref 8.7–10.2)
Chloride: 106 mmol/L (ref 96–106)
Creatinine, Ser: 0.68 mg/dL — ABNORMAL LOW (ref 0.76–1.27)
GFR calc Af Amer: 131 mL/min/{1.73_m2} (ref >59)
GFR calc non Af Amer: 114 mL/min/{1.73_m2} (ref >59)
Glucose: 209 mg/dL — ABNORMAL HIGH (ref 65–99)
Potassium: 4.2 mmol/L (ref 3.5–5.2)
Sodium: 144 mmol/L (ref 134–144)

## 2020-03-08 LAB — LDL CHOLESTEROL, DIRECT: LDL Direct: 70 mg/dL (ref 0–99)

## 2020-03-12 ENCOUNTER — Other Ambulatory Visit: Payer: Self-pay | Admitting: Family Medicine

## 2020-03-12 DIAGNOSIS — N529 Male erectile dysfunction, unspecified: Secondary | ICD-10-CM

## 2020-03-12 MED ORDER — SILDENAFIL CITRATE 50 MG PO TABS: 50.0000 mg | ORAL_TABLET | Freq: Every day | ORAL | 0 refills | Status: DC | PRN

## 2020-03-12 NOTE — Progress Notes (Signed)
Changed pharmacy for patient preference

## 2020-03-30 ENCOUNTER — Other Ambulatory Visit: Payer: Self-pay | Admitting: Family Medicine

## 2020-03-30 ENCOUNTER — Other Ambulatory Visit: Payer: Self-pay

## 2020-03-30 ENCOUNTER — Encounter: Payer: Self-pay | Admitting: Family Medicine

## 2020-03-30 DIAGNOSIS — E1165 Type 2 diabetes mellitus with hyperglycemia: Secondary | ICD-10-CM

## 2020-03-30 MED ORDER — PEN NEEDLES 32G X 6 MM MISC: 0.6000 mL | Freq: Every day | 1 refills | Status: DC

## 2020-03-30 NOTE — Telephone Encounter (Signed)
Also forwarded My Chart message. Sunday Spillers, CMA

## 2020-05-03 ENCOUNTER — Other Ambulatory Visit: Payer: Self-pay

## 2020-05-03 ENCOUNTER — Emergency Department (HOSPITAL_COMMUNITY)
Admission: EM | Admit: 2020-05-03 | Discharge: 2020-05-03 | Disposition: A | Discharge status: Home / Self Care | Payer: 59 | Attending: Emergency Medicine | Admitting: Emergency Medicine

## 2020-05-03 ENCOUNTER — Encounter (HOSPITAL_COMMUNITY): Payer: Self-pay | Admitting: Emergency Medicine

## 2020-05-03 ENCOUNTER — Emergency Department (HOSPITAL_COMMUNITY): Payer: 59

## 2020-05-03 DIAGNOSIS — M25572 Pain in left ankle and joints of left foot: Secondary | ICD-10-CM | POA: Diagnosis present

## 2020-05-03 DIAGNOSIS — E119 Type 2 diabetes mellitus without complications: Secondary | ICD-10-CM | POA: Diagnosis not present

## 2020-05-03 DIAGNOSIS — Y990 Civilian activity done for income or pay: Secondary | ICD-10-CM | POA: Diagnosis not present

## 2020-05-03 DIAGNOSIS — Z87891 Personal history of nicotine dependence: Secondary | ICD-10-CM | POA: Insufficient documentation

## 2020-05-03 DIAGNOSIS — X501XXA Overexertion from prolonged static or awkward postures, initial encounter: Secondary | ICD-10-CM | POA: Insufficient documentation

## 2020-05-03 DIAGNOSIS — Z794 Long term (current) use of insulin: Secondary | ICD-10-CM | POA: Diagnosis not present

## 2020-05-03 DIAGNOSIS — Z7984 Long term (current) use of oral hypoglycemic drugs: Secondary | ICD-10-CM | POA: Insufficient documentation

## 2020-05-03 NOTE — Discharge Instructions (Addendum)
I recommend a combination of tylenol and ibuprofen for management of your pain. You can take a low dose of both at the same time. I recommend 325 mg of Tylenol combined with 400 mg of ibuprofen. This is one regular Tylenol and two regular ibuprofen. You can take these 2-3 times for day for your pain. Please try to take these medications with a small amount of food as well to prevent upsetting your stomach.  Have given you follow-up with orthopedics below.  Please feel free to give them a call if your symptoms do not improve in the next week.  If your symptoms worsen, you can always return to the emergency department.  It was a pleasure to meet.

## 2020-05-03 NOTE — ED Triage Notes (Signed)
Pt c/o L ankle pain, states he injured it at work this morning. Able to bear weight but states it is painful

## 2020-05-03 NOTE — ED Provider Notes (Signed)
[facility] DEPT Provider Note   CSN: [id number] Arrival date & time: 05/03/20  6301     History Chief Complaint  Patient presents with  . Ankle Pain    Derrick Gonzales is a 49 y.o. male.  HPI Patient is a 49 year old male who presents to the emergency department with left ankle pain.  Patient states that he was walking on uneven cobblestone surface at his worksite this morning.  He inverted his left ankle resulting in moderate pain diffusely along the ankle.  Pain worsens when bearing weight as well as with range of motion.  No head trauma or LOC.  No other physical complaints at this time.  No numbness or weakness.    Past Medical History:  Diagnosis Date  . Chest pain on exertion 12/06/2011  . Diabetes mellitus   . Morbid obesity with BMI of 45.0-49.9, adult ([place]) 12/06/2011    Patient Active Problem List   Diagnosis Date Noted  . Fall 03/07/2020  . Tobacco abuse 01/03/2020  . Foul smelling urine 12/06/2019  . Syncope 09/17/2019  . Rib pain on right side 01/27/2019  . Need for immunization against influenza 01/27/2018  . Hypercholesterolemia 08/25/2017  . Erectile dysfunction 07/22/2017  . DM2 (diabetes mellitus, type 2) ([place]) 09/15/2014  . Morbid obesity with BMI of 45.0-49.9, adult ([place]) 12/06/2011    History reviewed. No pertinent surgical history.     Family History  Problem Relation Age of Onset  . Coronary artery disease Father   . Heart disease Father   . Diabetes Father   . Hypertension Mother   . Coronary artery disease Other     Social History   Tobacco Use  . Smoking status: Former Smoker    Quit date: 11/01/2011    Years since quitting: 8.5  . Smokeless tobacco: Never Used  . Tobacco comment: vaping with zero nicotine  Substance Use Topics  . Alcohol use: No    Alcohol/week: 0.0 standard drinks  . Drug use: No    Home Medications Prior to Admission medications   Medication Sig Start Date End Date  Taking? Authorizing Provider  Continuous Blood Gluc Receiver (DEXCOM G6 RECEIVER) DEVI 1 Device by Does not apply route as directed. Use with Dexcom G6 sensor and transmitter 09/29/19   Leavy [name], RPH-CPP  Continuous Blood Gluc Sensor (DEXCOM G6 SENSOR) MISC Inject 1 applicator into the skin as directed. Change sensor every 10 days. 09/29/19   Leavy [name], RPH-CPP  Continuous Blood Gluc Transmit (DEXCOM G6 TRANSMITTER) MISC Inject 1 Device into the skin as directed. Reuse 8 times with sensor changes. 09/29/19   Leavy [name], RPH-CPP  empagliflozin (JARDIANCE) 25 MG TABS tablet Take 1 tablet (25 mg total) by mouth daily before breakfast. 02/21/20   [name], [name], DO  glucose blood test strip Use as instructed 06/22/14   Chari Manning A, NP  glucose monitoring kit (FREESTYLE) monitoring kit 1 each by Does not apply route as needed. 06/22/14   [name], NP  insulin glargine (LANTUS SOLOSTAR) 100 UNIT/ML Solostar Pen Inject 30 Units into the skin daily. 02/06/20   Leavy [name], RPH-CPP  Insulin Pen Needle (PEN NEEDLES) 32G X 6 MM MISC 0.6 mLs by Does not apply route daily. 03/30/20   [name], [name], DO  Lancets (FREESTYLE) lancets Use as instructed 06/22/14   Chari Manning A, NP  liraglutide (VICTOZA) 18 MG/3ML SOPN Inject 1.8 mg into the skin daily. 03/05/20   [name],  [name], DO  lisinopril (ZESTRIL) 2.5 MG tablet Take 1 tablet (2.5 mg total) by mouth at bedtime. 01/02/20   [name], [name], DO  metFORMIN (GLUCOPHAGE-XR) 500 MG 24 hr tablet Take 1 tablet (500 mg total) by mouth daily with breakfast. 01/20/20   [name], [name], DO  rosuvastatin (CRESTOR) 40 MG tablet Take 1 tablet (40 mg total) by mouth daily. 02/21/20   [name], [name], DO  sildenafil (VIAGRA) 50 MG tablet Take 1 tablet (50 mg total) by mouth daily as needed for erectile dysfunction. 03/12/20   [name], [name], DO  sucralfate (CARAFATE) 1 g tablet Take 1 tablet (1 g total) by mouth 4  (four) times daily -  with meals and at bedtime. Patient not taking: Reported on 12/06/2019 10/11/19   Carmin Muskrat, MD    Allergies    Atorvastatin  Review of Systems   Review of Systems  Musculoskeletal: Positive for arthralgias. Negative for joint swelling.  Skin: Negative for color change and wound.  Neurological: Negative for weakness and numbness.   Physical Exam Updated Vital Signs BP 140/83   Pulse 90   Temp 98.3 F (36.8 C) (Oral)   Resp 18   Ht '5\' 5"'  (1.651 m)   Wt 122.5 kg   SpO2 95%   BMI 44.93 kg/m   Physical Exam Vitals and nursing note reviewed.  Constitutional:      General: He is not in acute distress.    Appearance: Normal appearance. He is not ill-appearing, toxic-appearing or diaphoretic.  HENT:     Head: Normocephalic and atraumatic.     Right Ear: External ear normal.     Left Ear: External ear normal.     Nose: Nose normal.     Mouth/Throat:     Mouth: Mucous membranes are moist.     Pharynx: Oropharynx is clear. No oropharyngeal exudate or posterior oropharyngeal erythema.  Eyes:     Extraocular Movements: Extraocular movements intact.  Cardiovascular:     Rate and Rhythm: Normal rate.     Pulses: Normal pulses.  Pulmonary:     Effort: Pulmonary effort is normal.  Abdominal:     General: Abdomen is flat.  Musculoskeletal:        General: Tenderness present. Normal range of motion.     Cervical back: Normal range of motion and neck supple. No tenderness.     Comments: Moderate TTP noted diffusely along the lateral malleolus, dorsum of the left ankle, medial malleolus.  Pain seems to be worst along the lateral malleolus.  Full active and passive range of motion of the left ankle.  Palpable pedal pulses.  Distal sensation intact.  Moving all 5 toes of the left foot with ease.  Good cap refill.  Unable to assess gait secondary to pain.  Skin:    General: Skin is warm and dry.  Neurological:     General: No focal deficit present.     Mental  Status: He is alert and oriented to person, place, and time.  Psychiatric:        Mood and Affect: Mood normal.        Behavior: Behavior normal.     ED Results / Procedures / Treatments   Labs (all labs ordered are listed, but only abnormal results are displayed) Labs Reviewed - No data to display  EKG None  Radiology DG Ankle Complete Left  Result Date: 05/03/2020 CLINICAL DATA:  Left ankle injury. EXAM: LEFT ANKLE COMPLETE - 3+ VIEW  COMPARISON:  No prior. FINDINGS: Small avulsion fracture noted of the medial malleolus, age undetermined. No other bony abnormality identified. Peripheral vascular calcification. IMPRESSION: 1. Small avulsion fracture of the medial malleolus, age undetermined. 2. Peripheral vascular disease. Electronically Signed   By: Marcello Moores  Register   On: 05/03/2020 07:37   Procedures Procedures (including critical care time)  Medications Ordered in ED Medications - No data to display  ED Course  I have reviewed the triage vital signs and the nursing notes.  Pertinent labs & imaging results that were available during my care of the patient were reviewed by me and considered in my medical decision making (see chart for details).  Clinical Course as of 05/03/20 0758  Thu May 03, 2020  0745 DG Ankle Complete Left IMPRESSION: 1. Small avulsion fracture of the medial malleolus, age undetermined. 2. Peripheral vascular disease. [LJ]    Clinical Course User Index [LJ] Rayna Sexton, PA-C   MDM Rules/Calculators/A&P                          Patient is a 49 year old male who presents to the emergency department with left ankle pain after inverting the left ankle.  Patient notes a history of similar occurrences in the past.  He states he is able to bear weight but this causes significant worsening pain.  I obtained x-rays which show a small age-indeterminate avulsion fracture along the medial malleolus but otherwise no acute abnormalities.  Given his history of  ankle sprains and his pain appearing to be worst along the lateral malleolus, feel this is an old fracture.  Patient provided cam boot as well as crutches.  Weightbearing and range of motion exercises as pain allows.  Recommended Tylenol and ibuprofen for management of his pain.  We discussed dosing.  He was given a follow-up with orthopedics.  Recommended return to the ER with any new or worsening symptoms.  His questions were answered and he was amicable at the time of discharge.  Final Clinical Impression(s) / ED Diagnoses Final diagnoses:  Acute left ankle pain   Rx / DC Orders ED Discharge Orders    None       Rayna Sexton, PA-C 05/03/20 [place], [place], DO 05/03/20 [phone]

## 2020-05-03 NOTE — ED Notes (Signed)
Patient transported to X-ray 

## 2020-05-08 DIAGNOSIS — S93402A Sprain of unspecified ligament of left ankle, initial encounter: Secondary | ICD-10-CM | POA: Diagnosis not present

## 2020-05-22 DIAGNOSIS — S93402A Sprain of unspecified ligament of left ankle, initial encounter: Secondary | ICD-10-CM | POA: Diagnosis not present

## 2020-05-24 ENCOUNTER — Other Ambulatory Visit: Payer: Self-pay

## 2020-05-24 DIAGNOSIS — E118 Type 2 diabetes mellitus with unspecified complications: Secondary | ICD-10-CM

## 2020-05-24 MED ORDER — METFORMIN HCL ER 500 MG PO TB24: 500.0000 mg | ORAL_TABLET | Freq: Every day | ORAL | 3 refills | Status: DC

## 2020-06-05 ENCOUNTER — Encounter: Payer: Self-pay | Admitting: Family Medicine

## 2020-06-06 ENCOUNTER — Other Ambulatory Visit: Payer: Self-pay

## 2020-06-06 DIAGNOSIS — E1165 Type 2 diabetes mellitus with hyperglycemia: Secondary | ICD-10-CM

## 2020-06-06 MED ORDER — EMPAGLIFLOZIN 25 MG PO TABS: 25.0000 mg | ORAL_TABLET | Freq: Every day | ORAL | 3 refills | Status: DC

## 2020-06-24 ENCOUNTER — Encounter: Payer: Self-pay | Admitting: Family Medicine

## 2020-07-09 ENCOUNTER — Encounter: Payer: Self-pay | Admitting: Family Medicine

## 2020-07-18 ENCOUNTER — Encounter: Payer: Self-pay | Admitting: Family Medicine

## 2020-08-01 ENCOUNTER — Other Ambulatory Visit: Payer: Self-pay

## 2020-08-01 ENCOUNTER — Encounter: Payer: Self-pay | Admitting: Family Medicine

## 2020-08-01 ENCOUNTER — Ambulatory Visit (INDEPENDENT_AMBULATORY_CARE_PROVIDER_SITE_OTHER): Payer: Medicaid Other | Admitting: Family Medicine

## 2020-08-01 VITALS — BP 128/85 | HR 98 | Ht 65.55 in | Wt 264.8 lb

## 2020-08-01 DIAGNOSIS — Z1159 Encounter for screening for other viral diseases: Secondary | ICD-10-CM | POA: Diagnosis not present

## 2020-08-01 DIAGNOSIS — E1165 Type 2 diabetes mellitus with hyperglycemia: Secondary | ICD-10-CM

## 2020-08-01 DIAGNOSIS — Z720 Tobacco use: Secondary | ICD-10-CM | POA: Diagnosis not present

## 2020-08-01 LAB — POCT GLYCOSYLATED HEMOGLOBIN (HGB A1C): HbA1c, POC (controlled diabetic range): 11.1 % — AB (ref 0.0–7.0)

## 2020-08-01 MED ORDER — DEXCOM G6 TRANSMITTER MISC
1.0000 | 3 refills | Status: DC
Start: 2020-08-01 — End: 2021-06-24

## 2020-08-01 MED ORDER — LIRAGLUTIDE 18 MG/3ML ~~LOC~~ SOPN: 0.6000 mg | PEN_INJECTOR | Freq: Every day | SUBCUTANEOUS | 3 refills | Status: DC

## 2020-08-01 MED ORDER — TRESIBA 100 UNIT/ML ~~LOC~~ SOLN: 36.0000 [IU] | Freq: Every day | SUBCUTANEOUS | 11 refills | Status: DC

## 2020-08-01 NOTE — Assessment & Plan Note (Signed)
Patient was congratulated on quitting smoking.

## 2020-08-01 NOTE — Patient Instructions (Signed)
Thank you for coming to see me today. It was a pleasure. Today we talked about:   We will get some labs today.  If they are abnormal or we need to do something about them, I will call you.  If they are normal, I will send you a message on MyChart (if it is active) or a letter in the mail.  If you don't hear from [name] in 2 weeks, please call the office at the number below.  Rachelle our pharmacist will reach out to you in about a week to talk about your Victoza dosing.  You will start at the lowest dose and go up from there.  Come back in 1 month for blood work on to check your kidney function.  Please follow-up with me in 2 months.  If you have any questions or concerns, please do not hesitate to call the office at [phone].  Best,   Luis Abed, DO

## 2020-08-01 NOTE — Assessment & Plan Note (Signed)
Diabetes is now uncontrolled given he has been without Victoza and Lantus.  He was previously well controlled, therefore suspect we can get him back on a regimen that this will improve.  Discussed with Cheral Almas, who will assist with patient's care.  Given sample for Victoza, will start him at 0.5 mg daily and titrate up after a week if we are able, where she will will give him a call in about a week.  We will obtain a BMP today.  We will also switch from Lantus to [place] as this is available as a sample here.  Equal conversion made, will start Tresiba 36 units daily.  Continue Jardiance and Metformin.  Future order placed for BMP in 1 month.  Plan is for him to follow-up with pharmacy in 1 month and then PCP in 2 months.  Patient was also given number for Dexcom to call to help with getting refills.  Continue ACE and Crestor.

## 2020-08-01 NOTE — Progress Notes (Signed)
Medication Samples have been provided to the patient.  Drug name: Insulin degludec Evaristo Bury)       Strength: 100 units/mL      Qty: 1 pen  LOT: [id number]  Exp.Date: 08/16/2021  Dosing instructions: Inject 36 units into the skin once daily  The patient has been instructed regarding the correct time, dose, and frequency of taking this medication, including desired effects and most common side effects.   Drug name: Liraglutide (Victoza)    Strength: 25mL     Qty: 2 pens LOT: [id number]  Exp.Date: 08/16/2021  Dosing instructions: Inject 0.6mg  into the skin once daily until follow-up  Cheral Almas 4:33 PM 08/01/2020

## 2020-08-01 NOTE — Progress Notes (Signed)
    SUBJECTIVE:   CHIEF COMPLAINT / HPI:   T2DM with HLD Current regimen: Jardiance 25 mg daily, Lantus 36 units daily, Victoza 1.8 mg daily, Metformin 500 mg daily Has been without Victoza for 2 months and Lantus for over 3 months Has only been taking Jardiance and Metformin CBGs highest 346 about a week Feels dizzy when sugars are high Drinks water a lot Dexcom has also not been refilled due to insurance issues Last A1c 7.9 on 03/07/2020 Last BMP 03/07/2020, creatinine 0.68 at that time Last lipid panel 01/02/2020, LDL was 98 LDL repeated on 03/07/2020 and was within goal at 70 On Crestor 40 mg daily and lisinopril 2.5 mg nightly  Tobacco abuse Has quit smoking since last office visit  PERTINENT  PMH / PSH: T2DM, morbid obesity, erectile dysfunction, HLD, tobacco abuse  OBJECTIVE:   BP 128/85   Pulse 98   Ht 5' 5.55" (1.665 m)   Wt 264 lb 12.8 oz (120.1 kg)   SpO2 98%   BMI 43.33 kg/m    Physical Exam:  General: 50 y.o. male in NAD Lungs: Breathing comfortably on room air Skin: warm and dry Extremities: No edema   Results for orders placed or performed in visit on 08/01/20 (from the past 24 hour(s))  HgB A1c     Status: Abnormal   Collection Time: 08/01/20  2:45 PM  Result Value Ref Range   Hemoglobin A1C     HbA1c POC (<> result, manual entry)     HbA1c, POC (prediabetic range)     HbA1c, POC (controlled diabetic range) 11.1 (A) 0.0 - 7.0 %     ASSESSMENT/PLAN:   DM2 (diabetes mellitus, type 2) ([facility]) Diabetes is now uncontrolled given he has been without Victoza and Lantus.  He was previously well controlled, therefore suspect we can get him back on a regimen that this will improve.  Discussed with Cheral Almas, who will assist with patient's care.  Given sample for Victoza, will start him at 0.5 mg daily and titrate up after a week if we are able, where she will will give him a call in about a week.  We will obtain a BMP today.  We will also switch from  Lantus to [place] as this is available as a sample here.  Equal conversion made, will start Tresiba 36 units daily.  Continue Jardiance and Metformin.  Future order placed for BMP in 1 month.  Plan is for him to follow-up with pharmacy in 1 month and then PCP in 2 months.  Patient was also given number for Dexcom to call to help with getting refills.  Continue ACE and Crestor.  Tobacco abuse Patient was congratulated on quitting smoking.     Unknown Jim, DO [facility]

## 2020-08-02 LAB — BASIC METABOLIC PANEL
BUN/Creatinine Ratio: 22 — ABNORMAL HIGH (ref 9–20)
BUN: 16 mg/dL (ref 6–24)
CO2: 23 mmol/L (ref 20–29)
Calcium: 9.1 mg/dL (ref 8.7–10.2)
Chloride: 99 mmol/L (ref 96–106)
Creatinine, Ser: 0.74 mg/dL — ABNORMAL LOW (ref 0.76–1.27)
Glucose: 118 mg/dL — ABNORMAL HIGH (ref 65–99)
Potassium: 4 mmol/L (ref 3.5–5.2)
Sodium: 139 mmol/L (ref 134–144)
eGFR: 112 mL/min/{1.73_m2} (ref >59)

## 2020-08-02 LAB — HCV INTERPRETATION

## 2020-08-02 LAB — HCV AB W REFLEX TO QUANT PCR: HCV Ab: <0.1 s/co ratio (ref 0.0–0.9)

## 2020-08-29 ENCOUNTER — Telehealth: Payer: Self-pay | Admitting: Pharmacist

## 2020-08-29 DIAGNOSIS — E1165 Type 2 diabetes mellitus with hyperglycemia: Secondary | ICD-10-CM

## 2020-08-29 MED ORDER — INSULIN GLARGINE 100 UNIT/ML SOLOSTAR PEN: 36.0000 [IU] | PEN_INJECTOR | SUBCUTANEOUS | 1 refills | Status: DC

## 2020-08-29 MED ORDER — TRESIBA 100 UNIT/ML ~~LOC~~ SOLN: 36.0000 [IU] | Freq: Every day | SUBCUTANEOUS | 11 refills | Status: DC

## 2020-08-29 MED ORDER — LIRAGLUTIDE 18 MG/3ML ~~LOC~~ SOPN: 0.6000 mg | PEN_INJECTOR | Freq: Every day | SUBCUTANEOUS | 3 refills | Status: DC

## 2020-08-29 NOTE — Telephone Encounter (Signed)
[facility] ATTENDING NOTE Maire Govan,MD  I agree with the resident's findings, assessment and care plan. 

## 2020-08-29 NOTE — Telephone Encounter (Signed)
Called and spoke to patient regarding medication changes made to diabetes regimen from visit on 08/01/20.  Patient states he ran out of [place] sample yesterday and that the pharmacy did not have a prescription for [place] or Victoza. Sent in prescriptions for Victoza. Evaristo Bury is not covered by Medicaid therefore switched him back to Lantus (only provided [place] sample as we did not have Lantus sample).  Patient currently injecting 36 units of Tresiba daily and properly tirated up his Victoza to 1.8mg  daily. He denies any nausea and vomiting.  Patient did say that he is still waiting on his Dexcom transmitter and the pharmacy stated they sent a PA to our clinic but they have not heard back.   He has follow-up appt already scheduled with me on 09/03/20.

## 2020-08-30 ENCOUNTER — Telehealth: Payer: Self-pay

## 2020-08-30 NOTE — Telephone Encounter (Signed)
Received fax from pharmacy, PA needed on [employer] Clinical questions submitted via Cover My Meds. Waiting on response, could take up to 72 hours.  Cover My Meds info: Key: [id number]

## 2020-09-03 ENCOUNTER — Other Ambulatory Visit: Payer: Self-pay

## 2020-09-03 ENCOUNTER — Other Ambulatory Visit: Payer: Medicaid Other

## 2020-09-03 ENCOUNTER — Ambulatory Visit (INDEPENDENT_AMBULATORY_CARE_PROVIDER_SITE_OTHER): Payer: Medicaid Other | Admitting: Pharmacist

## 2020-09-03 ENCOUNTER — Other Ambulatory Visit: Payer: Self-pay | Admitting: Family Medicine

## 2020-09-03 DIAGNOSIS — E1165 Type 2 diabetes mellitus with hyperglycemia: Secondary | ICD-10-CM

## 2020-09-03 DIAGNOSIS — N529 Male erectile dysfunction, unspecified: Secondary | ICD-10-CM

## 2020-09-03 MED ORDER — SILDENAFIL CITRATE 50 MG PO TABS
50.0000 mg | ORAL_TABLET | Freq: Every day | ORAL | 0 refills | Status: DC | PRN
Start: 2020-09-03 — End: 2020-10-16

## 2020-09-03 MED ORDER — PEN NEEDLES 32G X 6 MM MISC: 0.6000 mL | Freq: Every day | 1 refills | Status: DC

## 2020-09-03 NOTE — Progress Notes (Signed)
Subjective:    Patient ID: Derrick Gonzales, male    DOB: 1970-09-16, 50 y.o.   MRN: [id number]  HPI Patient is a 50 y.o. male who presents for diabetes management. He is in good spirits and presents without assistance. Patient was referred and last seen by Primary Care Provider on 08/01/20.  Patient states he has not presented to pharmacy to pickup Lantus, and took his last dose of Tresiba two days ago. He states he has not checked his blood glucose since then as he knows it will be elevated.  Patient reports diabetes was diagnosed about 28 years ago.   Insurance coverage/medication affordability: Medicaid  Family/Social history: lives with fiance  Current diabetes medications include: empagliflozin (Jardiance) 25mg  daily, liraglutide (Victoza) 1.8mg  daily, insulin glargine (Lantus) 36 units [previously taking Tresiba 36 units daily provided as sample], metformin 500mg   Current hypertension medications include: lisinopril 2.5mg   Current hyperlipidemia medications include: rosuvastatin 40mg  Patient states that He is taking his diabetes medications as prescribed. Patient denies adherence with medications. Patient states that He does not miss doses of his medications.  Does you feel that your medications are working for you?  yes  Have you been experiencing any side effects to the medications prescribed? no  Do you have any problems obtaining medications due to transportation or finances?  no    Patient reported dietary habits:  Eats 1 meals/day Breakfast:typically skips Lunch: typically skips Dinner:protein, veggie, carb Snacks: does not snack Drinks:water, coke zero, unsweetened green tea  Patient-reported exercise habits: denies exercise; had a gym membership prior to COVID    Patient reports singular hypoglycemic event of dizziness and blood sugar reading of 80. Attributes this event to not eating for a long period of time. Patient reports polyuria (increased urination) since  running out of insulin. Patient denies polyphagia (increased appetite).  Patient reports polydipsia (increased thirst) since running out of insulin. Patient reports neuropathy (nerve pain) in legs, in which he states he has a slight decrease in sensation to touch. Patient reports visual changes. Received new glasses around 6 months ago and states he has noticed his vision has become blurry.  Patient reports self foot exams.   Self-reported home fasting blood sugars: 150-170's; believes lowest was 80 and felt a little dizzy and highest was 230 which was the morning after running out of .  Objective:   Labs:   Lab Results  Component Value Date   HGBA1C 11.1 (A) 08/01/2020   HGBA1C 7.9 (A) 03/07/2020   HGBA1C 8.7 (A) 12/06/2019    Vitals:   09/03/20 1500  Weight: 268 lb 12.8 oz (121.9 kg)    Lab Results  Component Value Date   MICRALBCREAT 30-300 08/24/2017    Lipid Panel     Component Value Date/Time   CHOL 171 01/02/2020 1609   TRIG 125 01/02/2020 1609   HDL 51 01/02/2020 1609   CHOLHDL 3.4 01/02/2020 1609   CHOLHDL 4.8 06/22/2014 1635   VLDL 30 06/22/2014 1635   LDLCALC 98 01/02/2020 1609   LDLDIRECT 70 03/07/2020 1419    Clinical Atherosclerotic Cardiovascular Disease (ASCVD): No  The 10-year ASCVD risk score 08/21/2014 DC Jr., et al., 2013) is: 4.9%   Values used to calculate the score:     Age: 57 years     Sex: Male     Is Non-Hispanic African American: No     Diabetic: Yes     Tobacco smoker: No     Systolic Blood Pressure: 128 mmHg  Is BP treated: Yes     HDL Cholesterol: 51 mg/dL     Total Cholesterol: 171 mg/dL   PHQ-9 Score: did not complete  Assessment/Plan:   T2DM is not controlled likely due to running out of insulin. Medication adherence appears optimal besides past two days. Additional pharmacotherapy is not warranted. Patient will pick up insulin glargine (Lantus) from pharmacy today to re-start. Sent link to patient to connect dexcom  readings to clinic software. Will check in a week to see if patient was able to link up and if not will outreach to patient. Following instruction patient verbalized understanding of treatment plan.    1. Restarted basal insulin glargine (Lantus) 36 units once daily. 2. Continued GLP-1 liraglutide (Victoza) 1.8mg  once daily. 3. Continued SGLT2-I empagliflozin (Jardiance) 25mg  once daily. 4. Extensively discussed pathophysiology of diabetes, dietary effects on blood sugar control, and recommended lifestyle interventions,  5. Patient will adhere to dietary modifications 6. Patient will exercise 1-2x/week with goal to increase towards target of at least 150 minutes of moderate intensity exercise weekly 7. Counseled on s/sx of and management of hypoglycemia 8. Next A1C anticipated 11/01/2020.  Follow-up appointment in four weeks with PCP to review sugar readings. Written patient instructions provided.  This appointment required 45 minutes of patient care (this includes precharting, chart review, review of results, and face-to-face care).  Thank you for involving pharmacy to assist in providing this patient's care.

## 2020-09-03 NOTE — Patient Instructions (Signed)
Derrick Gonzales it was a pleasure seeing you today.   Please do the following:  1. Continue taking your medications as directed today during your appointment. If you have any questions or if you believe something has occurred because of this change, call me or your doctor to let one of [name] know.  2. Continue checking blood sugars at home. It's really important that you record these and bring these in to your next doctor's appointment.  3. Continue making the lifestyle changes we've discussed together during our visit. Diet and exercise play a significant role in improving your blood sugars.  4. Please accept link for dexcom connectivity   Hypoglycemia or low blood sugar:   Low blood sugar can happen quickly and may become an emergency if not treated right away.   While this shouldn't happen often, it can be brought upon if you skip a meal or do not eat enough. Also, if your insulin or other diabetes medications are dosed too high, this can cause your blood sugar to go to low.   Warning signs of low blood sugar include: 1. Feeling shaky or dizzy 2. Feeling weak or tired  3. Excessive hunger 4. Feeling anxious or upset  5. Sweating even when you aren't exercising  What to do if I experience low blood sugar? Follow the Rule of 15 1. Check your blood sugar with your meter. If lower than 70, proceed to step 2.  2. Treat with 15 grams of fast acting carbs which is found in 3-4 glucose tablets. If none are available you can try hard candy, 1 tablespoon of sugar or honey,4 ounces of fruit juice, or 6 ounces of REGULAR soda.  3. Re-check your sugar in 15 minutes. If it is still below 70, do what you did in step 2 again. If your blood sugar has come back up, go ahead and eat a snack or small meal made up of complex carbs (ex. Whole grains) and protein at this time to avoid recurrence of low blood sugar.

## 2020-09-03 NOTE — Assessment & Plan Note (Signed)
T2DM is not controlled likely due to running out of insulin. Medication adherence appears optimal besides past two days. Additional pharmacotherapy is not warranted. Patient will pick up insulin glargine (Lantus) from pharmacy today to re-start. Sent link to patient to connect dexcom readings to clinic software. Will check in a week to see if patient was able to link up and if not will outreach to patient. Following instruction patient verbalized understanding of treatment plan.    1. Restarted basal insulin glargine (Lantus) 36 units once daily. 2. Continued GLP-1 liraglutide (Victoza) 1.8mg  once daily. 3. Continued SGLT2-I empagliflozin (Jardiance) 25mg  once daily. 4. Extensively discussed pathophysiology of diabetes, dietary effects on blood sugar control, and recommended lifestyle interventions,  5. Patient will adhere to dietary modifications 6. Patient will exercise 1-2x/week with goal to increase towards target of at least 150 minutes of moderate intensity exercise weekly 7. Counseled on s/sx of and management of hypoglycemia 8. Next A1C anticipated 11/01/2020.

## 2020-10-02 ENCOUNTER — Ambulatory Visit: Payer: Medicaid Other | Admitting: Family Medicine

## 2020-10-02 NOTE — Progress Notes (Deleted)
    SUBJECTIVE:   CHIEF COMPLAINT / HPI:   T2DM Current regimen: Lantus 36 units daily, Victoza 1.8 mg daily, Jardiance 25 mg daily*** CBGs at home*** Last BMP 08/01/2020, stable at that time He had been without Victoza and Lantus for multiple months at visit on 08/01/2020, A1c at 11.1 Seen by pharmacy on 09/03/2020  Hyperlipidemia Last lipid panel 01/02/2020, LDL 98 at that time, current regimen Crestor 40 mg daily  Pneumonia vaccine*** Foot exam*** Colonoscopy*** COVID-vaccine***  PERTINENT  PMH / PSH: ***  OBJECTIVE:   There were no vitals taken for this visit.  ***  ASSESSMENT/PLAN:   No problem-specific Assessment & Plan notes found for this encounter.     Unknown Jim, DO [facility]   {    This will disappear when note is signed, click to select method of visit    :1}

## 2020-10-16 ENCOUNTER — Encounter: Payer: Self-pay | Admitting: Family Medicine

## 2020-10-16 ENCOUNTER — Ambulatory Visit (INDEPENDENT_AMBULATORY_CARE_PROVIDER_SITE_OTHER): Payer: Medicaid Other | Admitting: Family Medicine

## 2020-10-16 ENCOUNTER — Other Ambulatory Visit: Payer: Self-pay

## 2020-10-16 VITALS — BP 136/72 | HR 102 | Wt 265.6 lb

## 2020-10-16 DIAGNOSIS — E1165 Type 2 diabetes mellitus with hyperglycemia: Secondary | ICD-10-CM

## 2020-10-16 DIAGNOSIS — E7800 Pure hypercholesterolemia, unspecified: Secondary | ICD-10-CM | POA: Diagnosis not present

## 2020-10-16 DIAGNOSIS — F489 Nonpsychotic mental disorder, unspecified: Secondary | ICD-10-CM | POA: Diagnosis not present

## 2020-10-16 DIAGNOSIS — N529 Male erectile dysfunction, unspecified: Secondary | ICD-10-CM

## 2020-10-16 MED ORDER — TADALAFIL 10 MG PO TABS
10.0000 mg | ORAL_TABLET | ORAL | 0 refills | Status: DC | PRN
Start: 2020-10-16 — End: 2020-11-22
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
## 2020-10-16 NOTE — Patient Instructions (Signed)
Thank you for coming to see me today. It was a pleasure. Today we talked about:   We will get some labs today.  If they are abnormal or we need to do something about them, I will call you.  If they are normal, I will send you a message on MyChart (if it is active) or a letter in the mail.  If you don't hear from [name] in 2 weeks, please call the office at the number below.   Please follow-up with pharmacy in 2 weeks, me or your new PCP in 1 month.  If you have any questions or concerns, please do not hesitate to call the office at [phone].  Best,   Luis Abed, DO  Psychiatry Resource List (Adults and Children) Most of these providers will take Medicaid. please consult your insurance for a complete and updated list of available providers. When calling to make an appointment have your insurance information available to confirm you are covered.   [facility]:Psychiatry and Counseling [address] [place], [place] [address] [phone]  [facility]  [address] [place], [place] [phone] Crisis [phone]   [facility]:   [facility] [place]: [address]     [phone]   [facility]: [address] [place]. [place],        [phone] [place]: [address] Suite [phone],    [phone] 5 [place]: [phone] [address],                   [phone] Children: [facility] [address]         [phone]  [facility] (virtual only) [phone]    [facility]  (Psychiatry only; Adults /children 12 and over, will take Medicaid)  [address] Laurell Josephs [address], [place], [place] [address]       [phone]   [facility] (Psychiatry & counseling ; adults & children ; will take Medicaid [address]  [place] [address]  Go on-line to complete referral (  https://www.[email or web address].org/en/make-[email or web address]-referral [phone]    (Spanish speaking therapists)  [facility]  Psychiatry & counseling; Adults and children;  Call Registration prior to scheduling an appointment [phone] [address]    [place], [place] [address]    [phone]  [facility] (Psychiatry & counseling; adults & children; Medicare no Medicaid)  [address]   [place], [place]  [address]      [phone]    [facility] (up to age 36)  Psychiatry & counseling ,will take Medicaid, must do counseling to receive psychiatry services  [address] [place] [address]        [phone]  [facility] (Psychiatry & counseling; adults & children; will take Medicaid) Will need a referral from provider [address],  [place], [place]  [phone]   [facility] --- Walk-In Mon-Friday 8am-3pm ( will take Medicaid, Psychiatry, Adults & children,  [address] [place], [place]   [phone]   [facility] [place], Walk-in M-F 8am-12pm and 1pm -3pm   (Counseling, Psychiatry, will take Medicaid, adults & children)  [address], [place], [place]  [phone]

## 2020-10-16 NOTE — Assessment & Plan Note (Signed)
Continue Crestor.  Plan for lipid panel in August.

## 2020-10-16 NOTE — Assessment & Plan Note (Signed)
His Dexcom numbers are very reassuring with average glucose at 165.  Will not adjust his regimen today.  We will obtain a BMP today given his prior restarts on medications.  We will have him follow-up in 2 weeks with pharmacy team and in 4 weeks with PCP.

## 2020-10-16 NOTE — Assessment & Plan Note (Signed)
Rx sent to the pharmacy for Cialis instead of Viagra.

## 2020-10-17 ENCOUNTER — Other Ambulatory Visit: Payer: Self-pay | Admitting: Family Medicine

## 2020-10-17 DIAGNOSIS — E1165 Type 2 diabetes mellitus with hyperglycemia: Secondary | ICD-10-CM

## 2020-10-17 LAB — BASIC METABOLIC PANEL
BUN/Creatinine Ratio: 17 (ref 9–20)
BUN: 17 mg/dL (ref 6–24)
CO2: 15 mmol/L — ABNORMAL LOW (ref 20–29)
Calcium: 9.4 mg/dL (ref 8.7–10.2)
Chloride: 112 mmol/L — ABNORMAL HIGH (ref 96–106)
Creatinine, Ser: 0.98 mg/dL (ref 0.76–1.27)
Glucose: 317 mg/dL — ABNORMAL HIGH (ref 65–99)
Potassium: 5.1 mmol/L (ref 3.5–5.2)
Sodium: 148 mmol/L — ABNORMAL HIGH (ref 134–144)
eGFR: 95 mL/min/{1.73_m2} (ref >59)

## 2020-10-17 NOTE — Progress Notes (Signed)
See result note.  Will orally hydrate with water and repeat BMP 6/2.

## 2020-10-18 ENCOUNTER — Other Ambulatory Visit: Payer: Self-pay | Admitting: Pharmacist

## 2020-10-18 ENCOUNTER — Encounter: Payer: Self-pay | Admitting: Family Medicine

## 2020-10-19 ENCOUNTER — Other Ambulatory Visit: Payer: Medicaid Other

## 2020-10-22 ENCOUNTER — Other Ambulatory Visit: Payer: Medicaid Other

## 2020-11-12 ENCOUNTER — Other Ambulatory Visit: Payer: Self-pay | Admitting: Family Medicine

## 2020-11-14 ENCOUNTER — Ambulatory Visit: Payer: Medicaid Other | Admitting: Family Medicine

## 2020-11-14 NOTE — Progress Notes (Deleted)
    SUBJECTIVE:   CHIEF COMPLAINT / HPI:   T2DM Current regimen: Lantus 36 units daily, Victoza 1.8 mg daily, Jardiance 25 mg daily, metformin 500 mg daily Has not tolerated higher doses of metformin CBGs*** Last A1c on 08/01/2020 was 11.1 after he had been without multiple medications Dexcom ***  PERTINENT  PMH / PSH: ***  OBJECTIVE:   There were no vitals taken for this visit.  ***  ASSESSMENT/PLAN:   No problem-specific Assessment & Plan notes found for this encounter.     Unknown Jim, DO [facility]   {    This will disappear when note is signed, click to select method of visit    :1}

## 2020-11-20 ENCOUNTER — Encounter: Payer: Self-pay | Admitting: Student

## 2020-11-20 ENCOUNTER — Other Ambulatory Visit: Payer: Self-pay | Admitting: *Deleted

## 2020-11-20 DIAGNOSIS — E1165 Type 2 diabetes mellitus with hyperglycemia: Secondary | ICD-10-CM

## 2020-11-22 ENCOUNTER — Encounter: Payer: Self-pay | Admitting: Student

## 2020-11-22 ENCOUNTER — Ambulatory Visit (INDEPENDENT_AMBULATORY_CARE_PROVIDER_SITE_OTHER): Payer: Medicaid Other | Admitting: Student

## 2020-11-22 ENCOUNTER — Other Ambulatory Visit: Payer: Self-pay

## 2020-11-22 VITALS — BP 136/78 | HR 87 | Ht 65.55 in | Wt 267.0 lb

## 2020-11-22 DIAGNOSIS — F489 Nonpsychotic mental disorder, unspecified: Secondary | ICD-10-CM

## 2020-11-22 DIAGNOSIS — M546 Pain in thoracic spine: Secondary | ICD-10-CM

## 2020-11-22 DIAGNOSIS — N529 Male erectile dysfunction, unspecified: Secondary | ICD-10-CM | POA: Diagnosis not present

## 2020-11-22 DIAGNOSIS — E1165 Type 2 diabetes mellitus with hyperglycemia: Secondary | ICD-10-CM | POA: Diagnosis not present

## 2020-11-22 DIAGNOSIS — G8929 Other chronic pain: Secondary | ICD-10-CM | POA: Insufficient documentation

## 2020-11-22 LAB — POCT GLYCOSYLATED HEMOGLOBIN (HGB A1C): HbA1c, POC (controlled diabetic range): 8.9 % — AB (ref 0.0–7.0)

## 2020-11-22 MED ORDER — BACLOFEN 5 MG PO TABS: 5.0000 mg | ORAL_TABLET | Freq: Every evening | ORAL | 0 refills | Status: DC | PRN

## 2020-11-22 MED ORDER — EMPAGLIFLOZIN 25 MG PO TABS: 25.0000 mg | ORAL_TABLET | Freq: Every day | ORAL | 3 refills | Status: DC

## 2020-11-22 MED ORDER — TADALAFIL 10 MG PO TABS: 10.0000 mg | ORAL_TABLET | ORAL | 0 refills | Status: DC | PRN

## 2020-11-22 NOTE — Assessment & Plan Note (Addendum)
Refilled patient's Jardiance 25 mg with 3 refills.  ordered BMP

## 2020-11-22 NOTE — Patient Instructions (Addendum)
It was nice to see you today.   We have back pain I suspect back spasms.  We recommend against Aleve Because recommended can affect your kidney in the long run.  In place of Aleve he can take Tylenol and I also said I also we will encourage trying to move your back as much as you can  Will like to refer you to physical therapy for your back pain  In addition we will like to image studies on your back with x-rays to start and you can get these imaging at [facility] imaging  I would like to follow-up with you in 4 weeks for your back pain to see how you are progressing.  Also I have sent in your refill for Cialis to [facility] like you recommended. Jardiance refill has also been sent to [facility].  Will message Dr. Tresa Endo and she will reach out to you for a glucose monitor.

## 2020-11-22 NOTE — Progress Notes (Signed)
    SUBJECTIVE:   CHIEF COMPLAINT / HPI:   Ernestene Kiel with history of T2DM presented today for medication refills.  A1c is today's visit was 8.9.  Patient said he has been out of medication for the past 2 weeks and wanted a refill on Jardiance.  He has no complaints of palpitations, sweating or shortness of breath.  He did episodes of loss of balance which is a chronic problem and denies any fall.  Patient presented today with new onset right-sided upper back pain which he said has been going on for 2 months.  Describes the pain as a shooting pain that is occasionally associated with numbness in lower extremities bilaterally.  Pain only improved with slight readjustments in position and aleve.  Patient denies saddle anesthesia, no fever, bowel or urinary incontinence.  Patient has a longstanding history of mood disorder.  He has an upcoming therapy appointment.  He reports his mood is intermittently well.  He denies thoughts of hurting self or others   PERTINENT  PMH / PSH: T2DM, HTN, ED  OBJECTIVE:   BP 136/78   Pulse 87   Ht 5' 5.55" (1.665 m)   Wt 267 lb (121.1 kg)   SpO2 97%   BMI 43.69 kg/m   General: Alert, well-groomed, acute distress Cardio: Normal heart rate and rhythm, no murmurs, normal S1/S2 Respiratory: Clear breath sounds bilaterally, no wheezing Musculo : Right upper back tenderness, DTR is intact, 5/5 hip flexor and knee extensor.  Extremity: No edema lower extremities bilaterally, no noticeable ulcers in the foot bilaterally.  Intact sensation on mono filament test  ASSESSMENT/PLAN:   Mood problem We referred patient to chronic care management through connecting with a psychiatrist.  He has a history for possible bipolar disorder.  DM2 (diabetes mellitus, type 2) (HCC) Refilled patient's Jardiance 25 mg with 3 refills.  ordered BMP    Chronic right-sided thoracic back pain Most like muscle spasm. Less likely but could consider spinal stenosis. No  red flag symptoms for back pain.  Ordered baclofen 5 mg for nighttime as needed. Referred patient to physical therapy Ordered back x-ray   Patient declined COVID and pneumococcal vaccines.   Jerre Simon, MD [facility]

## 2020-11-22 NOTE — Assessment & Plan Note (Addendum)
Most like muscle spasm. Less likely but could consider spinal stenosis. No red flag symptoms for back pain.  Ordered baclofen 5 mg for nighttime as needed. Referred patient to physical therapy Ordered back x-ray

## 2020-11-22 NOTE — Assessment & Plan Note (Signed)
We referred patient to chronic care management through connecting with a psychiatrist.  He has a history for possible bipolar disorder.

## 2020-11-23 LAB — BASIC METABOLIC PANEL
BUN/Creatinine Ratio: 23 — ABNORMAL HIGH (ref 9–20)
BUN: 14 mg/dL (ref 6–24)
CO2: 24 mmol/L (ref 20–29)
Calcium: 9.2 mg/dL (ref 8.7–10.2)
Chloride: 104 mmol/L (ref 96–106)
Creatinine, Ser: 0.62 mg/dL — ABNORMAL LOW (ref 0.76–1.27)
Glucose: 152 mg/dL — ABNORMAL HIGH (ref 65–99)
Potassium: 3.8 mmol/L (ref 3.5–5.2)
Sodium: 142 mmol/L (ref 134–144)
eGFR: 118 mL/min/{1.73_m2} (ref >59)

## 2020-11-28 ENCOUNTER — Other Ambulatory Visit: Payer: Self-pay | Admitting: Licensed Clinical Social Worker

## 2020-11-28 NOTE — Patient Instructions (Signed)
Visit Information  Derrick Gonzales was given information about Medicaid Managed Care team care coordination services as a part of their [facility] Community Plan Medicaid benefit. Derrick Gonzales verbally consented to engagement with the [facility] Managed Care team.   For questions related to your [facility], please call: [phone] or visit the homepage here: [email or web address]  If you would like to schedule transportation through your [facility], please call the following number at least 2 days in advance of your appointment: [phone].   Call the Behavioral Health Crisis Line at [phone], at any time, 24 hours a day, 7 days a week. If you are in danger or need immediate medical attention call 911.  If you would like help to quit smoking, call 1-800-QUIT-NOW ((208) 062-1507) OR Espaol: 1-855-Djelo-Ya (7-262-035-5974) o para ms informacin haga clic aqu or Text READY to 163-845 to register via text  Derrick Gonzales - following are the goals we discussed in your visit today:   Goals Addressed             This Visit's Progress    Begin and Stick with Counseling-Depression       Timeframe:  Long-Range Goal Priority:  High Start Date:  11/28/20                          Expected End Date:  ongoing                     Follow Up Date 12/10/20    - check out counseling - keep 90 percent of counseling appointments - schedule counseling appointment    Why is this important?   Beating depression may take some time.  If you don't feel better right away, don't give up on your treatment plan.    Notes:         Derrick Gonzales, BSW, MSW, [employer] Managed Medicaid LCSW [facility]  [facility] [email or web address] Phone: [phone]

## 2020-11-28 NOTE — Patient Outreach (Signed)
Medicaid Managed Care Social Work Note  11/28/2020 Name:  Derrick Gonzales MRN:  [id number] DOB:  [birth year]  Derrick Gonzales is an 50 y.o. year old male who is a primary patient of [name], MD.  The Medicaid Managed Care Coordination team was consulted for assistance with:  [place] and Resources  Derrick Gonzales was given information about Medicaid Managed CareCoordination services today. Derrick Gonzales agreed to services and verbal consent obtained.  Engaged with patient  for by telephone forfollow up visit in response to referral for case management and/or care coordination services.   Assessments/Interventions:  Review of past medical history, allergies, medications, health status, including review of consultants reports, laboratory and other test data, was performed as part of comprehensive evaluation and provision of chronic care management services.  SDOH: (Social Determinant of Health) assessments and interventions performed: SDOH Interventions    Flowsheet Row Most Recent Value  SDOH Interventions   SDOH Interventions for the Following Domains Depression, Stress  Stress Interventions Provide Counseling  Depression Interventions/Treatment  Referral to Psychiatry       Advanced Directives Status:  See Care Plan for related entries.  Care Plan                 Allergies  Allergen Reactions   Atorvastatin Other (See Comments)    Migraine Headache    Medications Reviewed Today     Reviewed by [name], MD (Resident) on 11/22/20 at 1657  Med List Status: <None>   Medication Order Taking? Sig Documenting Provider Last Dose Status Informant  baclofen 5 MG TABS [id number] Yes Take 5 mg by mouth at bedtime as needed for muscle spasms. [name], MD  Active   Continuous Blood Gluc Receiver (DEXCOM G6 RECEIVER) DEVI [id number] Yes 1 Device by Does not apply route as directed. Use with Dexcom G6 sensor and transmitter Leavy [name], RPH-CPP  Taking Active Self  Continuous Blood Gluc Sensor (DEXCOM G6 SENSOR) MISC [id number] Yes INJECT 1 APPLICATOR INTO THE SKIN AS DIRECTED EVERY 10 DAYS [name], [name], DO Taking Active   Continuous Blood Gluc Transmit (DEXCOM G6 TRANSMITTER) MISC [id number] Yes Inject 1 Device into the skin as directed. Reuse 8 times with sensor changes. [name], [name], DO Taking Active   empagliflozin (JARDIANCE) 25 MG TABS tablet [id number]  Take 1 tablet (25 mg total) by mouth daily before breakfast. [name], MD  Active   glucose blood test strip [id number] Yes Use as instructed [name], NP Taking Active Self  glucose monitoring kit (FREESTYLE) monitoring kit [id number] Yes 1 each by Does not apply route as needed. [name], NP Taking Active Self  Insulin Pen Needle (PEN NEEDLES) 32G X 6 MM MISC [id number] Yes 0.6 mLs by Does not apply route daily. [name], [name], DO Taking Active   Lancets (FREESTYLE) lancets [id number] Yes Use as instructed [name], NP Taking Active Self  LANTUS SOLOSTAR 100 UNIT/ML Solostar Pen [id number] Yes ADMINISTER 36 UNITS UNDER THE SKIN EVERY MORNING [name], [name], DO Taking Active   liraglutide (VICTOZA) 18 MG/3ML SOPN [id number] Yes Inject 0.6 mg into the skin daily. 0.6 mg once daily for 1 week,then increase to 1.2 mg once daily,max 1.8  mg [name], [name], DO Taking Active   lisinopril (ZESTRIL) 2.5 MG tablet [id number] Yes Take 1 tablet (2.5 mg total) by mouth at bedtime. [name], [name], DO Taking Active   metFORMIN (GLUCOPHAGE-XR) 500 MG 24 hr tablet [id number] Yes  Take 1 tablet (500 mg total) by mouth daily with breakfast. [name], [name], DO Taking Active   rosuvastatin (CRESTOR) 40 MG tablet [id number] Yes Take 1 tablet (40 mg total) by mouth daily. [name], [name], DO Taking Active   sucralfate (CARAFATE) 1 g tablet [id number] No Take 1 tablet (1 g total) by mouth 4 (four) times daily -  with meals and at bedtime.   Patient not taking: No sig reported   Carmin Muskrat, MD Not Taking Active   tadalafil (CIALIS) 10 MG tablet [id number]  Take 1 tablet (10 mg total) by mouth every other day as needed for erectile dysfunction. [name], MD  Active             Patient Active Problem List   Diagnosis Date Noted   Chronic right-sided thoracic back pain 11/22/2020   Mood problem 10/16/2020   Tobacco abuse 01/03/2020   Rib pain on right side 01/27/2019   Hypercholesterolemia 08/25/2017   Erectile dysfunction 07/22/2017   DM2 (diabetes mellitus, type 2) ([place]) 09/15/2014   Morbid obesity with BMI of 45.0-49.9, adult ([place]) 12/06/2011    Conditions to be addressed/monitored per PCP order:  Anxiety and Depression  Care Plan : General Social Work (Adult)  Updates made by Derrick Cutter, LCSW since 11/28/2020 12:00 AM     Problem: Depression Identification (Depression)      Long-Range Goal: Depressive Symptoms Identified   Start Date: 11/28/2020  Note:   Timeframe:  Long-Range Goal Priority:  High Start Date:  11/28/20                          Expected End Date:  ongoing                     Follow Up Date 12/10/20    Current barriers:   Chronic Mental Health needs related to depression and anxiety. Patient has been diagnosed with "mood problems" Mental Health Concerns  Needs Support, Education, and Care Coordination in order to meet unmet mental health needs. Clinical Goal(s): patient will work with SW to address concerns related to increasing coping skills to manage mood patient will work with Marlan Palau to address needs related to needing medication management and counseling, referral placed on 11/28/20   Clinical Interventions:  Assessed patient's previous and current treatment, coping skills, support system and barriers to care  Review various resources, discussed options and provided patient information about Discussed several options for long term counseling based on need and  insurance Depression screen reviewed , Solution-Focused Strategies, Mindfulness or Relaxation Training, Problem [place] , Psychoeducation for mental health needs , Motivational Interviewing, Brief CBT , Verbalization of feelings encouraged , Suicidal Ideation/Homicidal Ideation assessed:, and PHQ2/ PHQ9 completed ; Patient interviewed and appropriate assessments performed Discussed plans with patient for ongoing care management follow up and provided patient with direct contact information for care management team Assisted patient/caregiver with obtaining information about health plan benefits Provided education and assistance to client regarding Advanced Directives. Discussed several options for long term counseling based on need and insurance.  Patient provided [facility] LCSW permission to placed a referral for [facility]. Inter-disciplinary care team collaboration (see longitudinal plan of care) LCSW discussed coping skills for anxiety and depression management. SW used empathetic and active and reflective listening, validated patient's feelings/concerns, and provided emotional support. LCSW provided self-care examples to help patient manage their multiple health conditions and improve his mood. Patient Goals/Self-Care Activities: Over  the next 120 days - barriers to treatment adherence identified - complementary therapy use encouraged - counseling provided - depression screen reviewed - emotional liability acknowledged and normalized - participation in mental health treatment encouraged - self-awareness of emotional triggers encouraged - strategies to manage emotional triggers promoted - avoid negative self-talk - develop a personal safety plan - develop a plan to deal with triggers like holidays, anniversaries - exercise at least 2 to 3 times per week - have a plan for how to handle bad days - journal feelings and what helps to feel better or worse - spend time or talk with others  at least 2 to 3 times per week - spend time or talk with others every day - watch for early signs of feeling worse - write in journal every day - begin personal counseling - call and visit an old friend - check out volunteer opportunities - join a support group - laugh; watch a funny movie or comedian - learn and use visualization or guided imagery - perform a random act of kindness - practice relaxation or meditation daily - start or continue a personal journal - talk about feelings with a friend, family or spiritual advisor - practice positive thinking and self-talk I have placed a referral with [facility] to assist with connecting you with a mental health provider. they will contact you once a provider is located.       Follow up:  Patient agrees to Care Plan and Follow-up.  Plan: The Managed Medicaid care management team will reach out to the patient again over the next 30 days.  Date of next scheduled Social Work care management/care coordination outreach:  12/10/20  Eula Fried, BSW, MSW, LCSW Managed Medicaid LCSW [email or web address] Phone: [phone]

## 2020-12-05 ENCOUNTER — Other Ambulatory Visit: Payer: Self-pay

## 2020-12-05 ENCOUNTER — Encounter: Payer: Self-pay | Admitting: Physical Therapy

## 2020-12-05 ENCOUNTER — Ambulatory Visit: Payer: Medicaid Other | Attending: Family Medicine | Admitting: Physical Therapy

## 2020-12-05 DIAGNOSIS — N529 Male erectile dysfunction, unspecified: Secondary | ICD-10-CM

## 2020-12-05 DIAGNOSIS — R252 Cramp and spasm: Secondary | ICD-10-CM | POA: Insufficient documentation

## 2020-12-05 DIAGNOSIS — M546 Pain in thoracic spine: Secondary | ICD-10-CM | POA: Diagnosis present

## 2020-12-05 DIAGNOSIS — M5442 Lumbago with sciatica, left side: Secondary | ICD-10-CM | POA: Diagnosis present

## 2020-12-05 DIAGNOSIS — G8929 Other chronic pain: Secondary | ICD-10-CM

## 2020-12-05 DIAGNOSIS — M5441 Lumbago with sciatica, right side: Secondary | ICD-10-CM | POA: Diagnosis present

## 2020-12-05 DIAGNOSIS — M6281 Muscle weakness (generalized): Secondary | ICD-10-CM | POA: Insufficient documentation

## 2020-12-05 DIAGNOSIS — E1165 Type 2 diabetes mellitus with hyperglycemia: Secondary | ICD-10-CM

## 2020-12-05 MED ORDER — BACLOFEN 5 MG PO TABS: 5.0000 mg | ORAL_TABLET | Freq: Every evening | ORAL | 1 refills | Status: DC | PRN

## 2020-12-05 MED ORDER — EMPAGLIFLOZIN 25 MG PO TABS: 25.0000 mg | ORAL_TABLET | Freq: Every day | ORAL | 3 refills | Status: DC

## 2020-12-05 MED ORDER — TADALAFIL 10 MG PO TABS: 10.0000 mg | ORAL_TABLET | ORAL | 3 refills | Status: DC | PRN

## 2020-12-05 NOTE — Therapy (Signed)
[facility] [place] [address] [place], [place], [address] Phone: [phone]   Fax:  [phone]  Physical Therapy Evaluation  Patient Details  Name: Derrick Gonzales MRN: [id number] Date of Birth: 03-18-1971 Referring Provider (PT): Oval Linsey Date: 12/05/2020   PT End of Session - 12/05/20 1658     Visit Number 1    Number of Visits 27    Date for PT Re-Evaluation 02/05/21    PT Start Time 1515    PT Stop Time 1554    PT Time Calculation (min) 39 min    Activity Tolerance Patient tolerated treatment well    Behavior During Therapy [facility] for tasks assessed/performed             Past Medical History:  Diagnosis Date   Chest pain on exertion 12/06/2011   Diabetes mellitus    Morbid obesity with BMI of 45.0-49.9, adult (HCC) 12/06/2011    History reviewed. No pertinent surgical history.  There were no vitals filed for this visit.    Subjective Assessment - 12/05/20 1518     Subjective Pt reports that he has been having R sided upper thoracic pain for the past few months with no known MOI. Pt states that he has pain with movement especially rotation. Pt reports that he also has LBP with occasional BLE radiating pain. Reports turning to R side and reaching into cabinet consistently aggravate pain. Pt does [employer] and [employer] for work; has pain with driving. Pt denies chest pain and denies increases in pain with eating. Denies saddle anesthesia or changes to bowel/bladder    Pertinent History Type II DM    Limitations Standing;Walking    Diagnostic tests xrays of thoracic/lumbar ordered    Patient Stated Goals reduce pain    Currently in Pain? Yes    Pain Score 6     Pain Location Thoracic    Pain Orientation Right;Mid    Pain Descriptors / Indicators Dull;Nagging    Pain Type Acute pain    Pain Onset More than a month ago    Pain Frequency Constant    Aggravating Factors  twisting to R, reaching up into  cabinet, quick movements    Pain Relieving Factors aleve, massage (helps a little), rest                [facility] PT Assessment - 12/05/20 0001       Assessment   Medical Diagnosis R sided thoracic and LBP    Referring Provider (PT) Elliot Gurney    Hand Dominance Right    Next MD Visit --   call back as needed   Prior Therapy none      Precautions   Precautions None      Restrictions   Weight Bearing Restrictions No      Balance Screen   Has the patient fallen in the past 6 months No    Has the patient had a decrease in activity level because of a fear of falling?  No    Is the patient reluctant to leave their home because of a fear of falling?  No      Home Environment   Additional Comments pt has ramp at house; no trouble with stairs      Prior Function   Level of Independence Independent    Vocation Part time employment    Vocation Requirements [employer] and [employer]    Leisure fishing, hunting, working on truck  Observation/Other Assessments   Focus on Therapeutic Outcomes (FOTO)  36%      Sensation   Light Touch Appears Intact      Functional Tests   Functional tests Sit to Stand      Sit to Stand   Comments [facility]      Posture/Postural Control   Posture/Postural Control Postural limitations    Postural Limitations Rounded Shoulders;Forward head      ROM / Strength   AROM / PROM / Strength AROM;Strength      AROM   AROM Assessment Site Lumbar    Lumbar Flexion limited 25% d/t pain + HS tightness    Lumbar Extension limited 50% + BLE pain radiating    Lumbar - Right Side Bend limited 25%    Lumbar - Left Side Bend limited 25%    Lumbar - Right Rotation limited 50% + pain    Lumbar - Left Rotation limited 50% + pain      Strength   Overall Strength Comments BLE 5/5; demos core weakness/instability with supine<>sit and supine core ex's      Flexibility   Soft Tissue Assessment /Muscle Length yes    Hamstrings very tight B R>L    Quadriceps tight     ITB tight    Piriformis tight B R>L      Palpation   Palpation comment ttp R thoracic paraspinals just inferior/medial to inf angle of scapula      Special Tests   Other special tests SLR 50 deg LLE, 45 deg RLE      Ambulation/Gait   Gait Comments widened BOS with decreased trunk rotation                        Objective measurements completed on examination: See above findings.                 PT Short Term Goals - 12/05/20 1705       PT SHORT TERM GOAL #1   Title Pt will be I with initial HEP    Time 2    Period Weeks    Status New    Target Date 12/19/20               PT Long Term Goals - 12/05/20 1705       PT LONG TERM GOAL #1   Title Pt will be I with advanced HEP    Time 6    Period Weeks    Status New    Target Date 01/16/21      PT LONG TERM GOAL #2   Title Pt will report 50% reduction in thoracic/LBP    Time 6    Period Weeks    Status New    Target Date 01/16/21      PT LONG TERM GOAL #3   Title Pt will demo lumbar AROM <25% limited with no increase in thoracolumbar pain    Time 6    Period Weeks    Status New    Target Date 01/16/21      PT LONG TERM GOAL #4   Title Pt will report able to complete shift with [employer] with no increase in thoracolumbar pain    Time 6    Period Weeks    Status New    Target Date 01/16/21                    Plan - 12/05/20 1659  Clinical Impression Statement Pt presents to clinic with reports of acute R sided thoracic pain along with chronic B LBP with occasional radiating pain to BLEs. Endorses occasional BLE N/T. Thoracic pain is localized to tender point inferior/medial to inf angle of scapula. Pt has most pain with thoracolumbar rotation, lumbar flexion, and lumbar extension. Pt demos moderate core weakness with difficulty transitioning sit<>supine and with supine core ex's. Also demos LE flexibility deficits R>L. Pt would benefit from skilled PT to  address the above impairments.    Personal Factors and Comorbidities Comorbidity 1    Comorbidities Type II DM    Examination-Activity Limitations Lift;Stand;Sleep    Examination-Participation Restrictions Community Activity;Interpersonal Relationship;Occupation    Stability/Clinical Decision Making Stable/Uncomplicated    Clinical Decision Making Low    Rehab Potential Good    PT Frequency 2x / week    PT Duration 6 weeks    PT Treatment/Interventions ADLs/Self Care Home Management;Electrical Stimulation;Iontophoresis 4mg /ml Dexamethasone;Moist Heat;Traction;Neuromuscular re-education;Therapeutic exercise;Therapeutic activities;Patient/family education;Manual techniques;Dry needling;Passive range of motion    PT Next Visit Plan review/progress HEP, core/lumbar stab ex's, LE flexibility, manual/modalities as indicated    PT Home Exercise Plan see pt instructions    Consulted and Agree with Plan of Care Patient             Patient will benefit from skilled therapeutic intervention in order to improve the following deficits and impairments:  Abnormal gait, Decreased range of motion, Difficulty walking, Decreased endurance, Increased muscle spasms, Decreased activity tolerance, Pain, Hypomobility, Impaired flexibility, Decreased strength, Postural dysfunction  Visit Diagnosis: Chronic bilateral low back pain with bilateral sciatica  Pain in thoracic spine  Muscle weakness (generalized)  Cramp and spasm     Problem List Patient Active Problem List   Diagnosis Date Noted   Chronic right-sided thoracic back pain 11/22/2020   Mood problem 10/16/2020   Tobacco abuse 01/03/2020   Rib pain on right side 01/27/2019   Hypercholesterolemia 08/25/2017   Erectile dysfunction 07/22/2017   DM2 (diabetes mellitus, type 2) (HCC) 09/15/2014   Morbid obesity with BMI of 45.0-49.9, adult (HCC) 12/06/2011   12/08/2011, PT, DPT Lysle Rubens Salimah Martinovich 12/05/2020, 5:08 PM  [facility] [place] [facility] [address] [place], [place], [place] Phone: [phone]   Fax:  [phone]  Name: Derrick Gonzales MRN: Ernestene Kiel Date of Birth: 09/02/1970

## 2020-12-05 NOTE — Telephone Encounter (Signed)
Patient calls nurse line regarding issues with medications being covered through insurance. Current PCP is not yet enrolled with [facility], therefore, insurance will not cover prescriptions.   Forwarding to precepting provider to resend the pended medications.   Veronda Prude, RN

## 2020-12-05 NOTE — Patient Instructions (Signed)
Access Code: [email or web address] URL: [email or web address] Date: 12/05/2020 Prepared by: Lysle Rubens  Exercises Supine Lower Trunk Rotation - 1 x daily - 7 x weekly - 2 sets - 10 reps - 5 sec hold Supine Bridge - 1 x daily - 7 x weekly - 2 sets - 10 reps Seated Hamstring Stretch - 1 x daily - 7 x weekly - 2 sets - 2 reps - 20 sec hold Seated Flexion Stretch with Swiss Ball - 1 x daily - 7 x weekly - 2 sets - 5 reps - 5 sec hold Seated Thoracic Flexion and Rotation with Swiss Ball - 1 x daily - 7 x weekly - 2 sets - 5 reps - 5 sec hold

## 2020-12-10 ENCOUNTER — Ambulatory Visit: Payer: Medicaid Other | Admitting: Physical Therapy

## 2020-12-10 ENCOUNTER — Encounter: Payer: Self-pay | Admitting: Physical Therapy

## 2020-12-10 ENCOUNTER — Other Ambulatory Visit: Payer: Self-pay | Admitting: Licensed Clinical Social Worker

## 2020-12-10 ENCOUNTER — Other Ambulatory Visit: Payer: Self-pay

## 2020-12-10 DIAGNOSIS — M5442 Lumbago with sciatica, left side: Secondary | ICD-10-CM

## 2020-12-10 DIAGNOSIS — M546 Pain in thoracic spine: Secondary | ICD-10-CM

## 2020-12-10 DIAGNOSIS — M6281 Muscle weakness (generalized): Secondary | ICD-10-CM

## 2020-12-10 DIAGNOSIS — F331 Major depressive disorder, recurrent, moderate: Secondary | ICD-10-CM | POA: Diagnosis not present

## 2020-12-10 DIAGNOSIS — G8929 Other chronic pain: Secondary | ICD-10-CM

## 2020-12-10 DIAGNOSIS — R252 Cramp and spasm: Secondary | ICD-10-CM

## 2020-12-10 NOTE — Patient Instructions (Signed)
Visit Information  Derrick Gonzales was given information about Medicaid Managed Care team care coordination services as a part of their [facility] Community Plan Medicaid benefit. Tawni Pummel Shostak verbally consented to engagement with the [facility] Managed Care team.   For questions related to your [facility], please call: [phone] or visit the homepage here: [email or web address]  If you would like to schedule transportation through your [facility], please call the following number at least 2 days in advance of your appointment: [phone].   Call the Behavioral Health Crisis Line at [phone], at any time, 24 hours a day, 7 days a week. If you are in danger or need immediate medical attention call 911.  If you would like help to quit smoking, call 1-800-QUIT-NOW (450-081-6956) OR Espaol: 1-855-Djelo-Ya (0-071-219-7588) o para ms informacin haga clic aqu or Text READY to 325-498 to register via text  Mr. Markin - following are the goals we discussed in your visit today:   Goals Addressed             This Visit's Progress    Begin and Stick with Counseling-Depression       Timeframe:  Long-Range Goal Priority:  High Start Date:  11/28/20                          Expected End Date:  ongoing                     Follow Up Date 12/28/20   - check out counseling - keep 90 percent of counseling appointments - schedule counseling appointment    Why is this important?   Beating depression may take some time.  If you don't feel better right away, don't give up on your treatment plan.    Notes:         [name], BSW, MSW, [employer] Managed Medicaid LCSW [facility]  [facility] [email or web address] Phone: [phone]

## 2020-12-10 NOTE — Therapy (Signed)
[facility] [place] [facility] [address] [place], [place], [address] Phone: [phone]   Fax:  [phone]  Physical Therapy Treatment  Patient Details  Name: Derrick Gonzales MRN: [id number] Date of Birth: 1971-05-09 Referring Provider (PT): Oval Linsey Date: 12/10/2020   PT End of Session - 12/10/20 1504     Visit Number 2    Number of Visits 27    Date for PT Re-Evaluation 02/05/21    PT Start Time 1425    PT Stop Time 1514    PT Time Calculation (min) 49 min    Activity Tolerance Patient tolerated treatment well    Behavior During Therapy [facility] for tasks assessed/performed             Past Medical History:  Diagnosis Date   Chest pain on exertion 12/06/2011   Diabetes mellitus    Morbid obesity with BMI of 45.0-49.9, adult (HCC) 12/06/2011    History reviewed. No pertinent surgical history.  There were no vitals filed for this visit.   Subjective Assessment - 12/10/20 1423     Subjective Pt is doing well. He has been doing his HEP but it hasnt been making anything better.    Pain Score 8     Pain Location Back    Pain Orientation Right;Mid    Aggravating Factors  stretching    Pain Relieving Factors laying flat                               [facility] Adult PT Treatment/Exercise - 12/10/20 0001       Exercises   Exercises Lumbar      Lumbar Exercises: Stretches   Other Lumbar Stretch Exercise trunk flexion/oblique blue pball x10    Other Lumbar Stretch Exercise trunk rotaion x10      Lumbar Exercises: Aerobic   Nustep L5x6 min      Lumbar Exercises: Standing   Other Standing Lumbar Exercises trunk extension yellow ball x10      Lumbar Exercises: Seated   Other Seated Lumbar Exercises abd bracing 2x10      Lumbar Exercises: Supine   Bent Knee Raise 20 reps   red tband   Bridge 20 reps;Compliant   partial ROM     Modalities   Modalities Electrical Stimulation;Moist Heat       Moist Heat Therapy   Number Minutes Moist Heat 15 Minutes    Moist Heat Location Lumbar Spine      Electrical Stimulation   Electrical Stimulation Location thoracic spine    Electrical Stimulation Action 15 min    Electrical Stimulation Parameters IFC    Electrical Stimulation Goals Pain                      PT Short Term Goals - 12/10/20 1504       PT SHORT TERM GOAL #1   Title Pt will be I with initial HEP    Time 2    Period Weeks    Status On-going    Target Date 12/19/20               PT Long Term Goals - 12/05/20 1705       PT LONG TERM GOAL #1   Title Pt will be I with advanced HEP    Time 6    Period Weeks    Status New  Target Date 01/16/21      PT LONG TERM GOAL #2   Title Pt will report 50% reduction in thoracic/LBP    Time 6    Period Weeks    Status New    Target Date 01/16/21      PT LONG TERM GOAL #3   Title Pt will demo lumbar AROM <25% limited with no increase in thoracolumbar pain    Time 6    Period Weeks    Status New    Target Date 01/16/21      PT LONG TERM GOAL #4   Title Pt will report able to complete shift with [employer] with no increase in thoracolumbar pain    Time 6    Period Weeks    Status New    Target Date 01/16/21                   Plan - 12/10/20 1458     Clinical Impression Statement Pt reports an 8/10 pain today in his back. He reports he has been doing his HEP but it seems to be making his pain worse. Pt tolerated exercises with some increases in pain thorughout. Noted core weakness and forward head posture. Estim and MHP was done at the end of treatment to help decrease pain.    PT Next Visit Plan assess if estim helped, review/progress HEP, core/lumbar stab ex's, LE flexibility, manual/modalities as indicated    PT Home Exercise Plan see pt instructions             Patient will benefit from skilled therapeutic intervention in order to improve the following deficits and  impairments:  Abnormal gait, Decreased range of motion, Difficulty walking, Decreased endurance, Increased muscle spasms, Decreased activity tolerance, Pain, Hypomobility, Impaired flexibility, Decreased strength, Postural dysfunction  Visit Diagnosis: Chronic bilateral low back pain with bilateral sciatica  Pain in thoracic spine  Muscle weakness (generalized)  Cramp and spasm     Problem List Patient Active Problem List   Diagnosis Date Noted   Chronic right-sided thoracic back pain 11/22/2020   Mood problem 10/16/2020   Tobacco abuse 01/03/2020   Rib pain on right side 01/27/2019   Hypercholesterolemia 08/25/2017   Erectile dysfunction 07/22/2017   DM2 (diabetes mellitus, type 2) (HCC) 09/15/2014   Morbid obesity with BMI of 45.0-49.9, adult (HCC) 12/06/2011    Dorthula Perfect, SPTA 12/10/2020, 3:17 PM  [facility] [place] [facility] [address] [place], [place], [address] Phone: [phone]   Fax:  [phone]  Name: Derrick Gonzales MRN: [id number] Date of Birth: Apr 21, 1971

## 2020-12-11 ENCOUNTER — Telehealth: Payer: Self-pay | Admitting: *Deleted

## 2020-12-11 DIAGNOSIS — E1165 Type 2 diabetes mellitus with hyperglycemia: Secondary | ICD-10-CM

## 2020-12-11 NOTE — Telephone Encounter (Signed)
Patient left a message on the referral line asking for a referral to see the podiatrist for his diabetes.  Will forward to MD.  Burnard Hawthorne

## 2020-12-13 ENCOUNTER — Ambulatory Visit: Payer: Medicaid Other | Admitting: Physical Therapy

## 2020-12-13 NOTE — Telephone Encounter (Signed)
Referral to podiatrist placed. Please let the patient know. Thanks Jerre Simon, MD

## 2020-12-17 ENCOUNTER — Other Ambulatory Visit: Payer: Self-pay | Admitting: *Deleted

## 2020-12-17 DIAGNOSIS — E1165 Type 2 diabetes mellitus with hyperglycemia: Secondary | ICD-10-CM

## 2020-12-17 MED ORDER — LISINOPRIL 2.5 MG PO TABS: 2.5000 mg | ORAL_TABLET | Freq: Every day | ORAL | 3 refills | Status: DC

## 2020-12-17 NOTE — Telephone Encounter (Signed)
[facility] has lvm for pt to schedule appointment.Derrick Gonzales, CMA

## 2020-12-18 ENCOUNTER — Ambulatory Visit: Payer: Medicaid Other | Attending: Family Medicine | Admitting: Physical Therapy

## 2020-12-20 ENCOUNTER — Encounter (INDEPENDENT_AMBULATORY_CARE_PROVIDER_SITE_OTHER): Payer: Medicaid Other | Admitting: Student

## 2020-12-20 NOTE — Progress Notes (Deleted)
    SUBJECTIVE:   CHIEF COMPLAINT / HPI:  Lower Back pain   PT: Patient will benefit from skilled therapeutic intervention -lower back pain with bilateral Sciatica  -Thorasic spine pain -Muscle weaknessnes and spasms  On Baclofen  PERTINENT  PMH / PSH:  Past Medical History:  Diagnosis Date   Chest pain on exertion 12/06/2011   Diabetes mellitus    Morbid obesity with BMI of 45.0-49.9, adult (HCC) 12/06/2011   ROS  OBJECTIVE:   There were no vitals taken for this visit.   Physical Exam General: Cardiovascular: Respiratory: Abdomen: Extremities:  ASSESSMENT/PLAN:   No problem-specific Assessment & Plan notes found for this encounter.      Jerre Simon, MD [facility]   {    This will disappear when note is signed, click to select method of visit    :1}

## 2020-12-21 ENCOUNTER — Ambulatory Visit: Payer: Medicaid Other | Admitting: Physical Therapy

## 2020-12-28 ENCOUNTER — Other Ambulatory Visit: Payer: Self-pay | Admitting: Licensed Clinical Social Worker

## 2020-12-28 NOTE — Patient Outreach (Addendum)
Medicaid Managed Care Social Work Note  12/28/2020 Name:  Derrick Gonzales MRN:  [id number] DOB:  January 10, 1971  Derrick Gonzales is an 50 y.o. year old male who is a primary patient of [name], MD.  The Medicaid Managed Care Coordination team was consulted for assistance with:  [place] and Resources  Mr. Prevette was given information about Medicaid Managed Care Coordination team services today. Edna Patient agreed to services and verbal consent obtained.  Engaged with patient  for by telephone forfollow up visit in response to referral for case management and/or care coordination services.   Assessments/Interventions:  Review of past medical history, allergies, medications, health status, including review of consultants reports, laboratory and other test data, was performed as part of comprehensive evaluation and provision of chronic care management services.  SDOH: (Social Determinant of Health) assessments and interventions performed: SDOH Interventions    Flowsheet Row Most Recent Value  SDOH Interventions   Stress Interventions Provide Counseling  Depression Interventions/Treatment  Referral to Psychiatry       Advanced Directives Status:  See Care Plan for related entries.  Care Plan                 Allergies  Allergen Reactions   Atorvastatin Other (See Comments)    Migraine Headache    Medications Reviewed Today     Reviewed by Dawayne Cirri ([facility] Therapist Assistant) on 12/10/20 at 1423  Med List Status: <None>   Medication Order Taking? Sig Documenting Provider Last Dose Status Informant  Baclofen 5 MG TABS [id number]  Take 5 mg by mouth at bedtime as needed. Lind Covert, MD  Active   Continuous Blood Gluc Receiver (DEXCOM G6 RECEIVER) DEVI [id number] No 1 Device by Does not apply route as directed. Use with Dexcom G6 sensor and transmitter Leavy [name], RPH-CPP Taking Active Self  Continuous Blood  Gluc Sensor (DEXCOM G6 SENSOR) MISC [id number] No INJECT 1 APPLICATOR INTO THE SKIN AS DIRECTED EVERY 10 DAYS [name], [name], DO Taking Active   Continuous Blood Gluc Transmit (DEXCOM G6 TRANSMITTER) MISC [id number] No Inject 1 Device into the skin as directed. Reuse 8 times with sensor changes. [name], [name], DO Taking Active   empagliflozin (JARDIANCE) 25 MG TABS tablet [id number]  Take 1 tablet (25 mg total) by mouth daily before breakfast. Lind Covert, MD  Active   glucose blood test strip [id number] No Use as instructed [name], NP Taking Active Self  glucose monitoring kit (FREESTYLE) monitoring kit [id number] No 1 each by Does not apply route as needed. [name], NP Taking Active Self  Insulin Pen Needle (PEN NEEDLES) 32G X 6 MM MISC [id number] No 0.6 mLs by Does not apply route daily. [name], [name], DO Taking Active   Lancets (FREESTYLE) lancets [id number] No Use as instructed [name], NP Taking Active Self  LANTUS SOLOSTAR 100 UNIT/ML Solostar Pen [id number] No ADMINISTER 36 UNITS UNDER THE SKIN EVERY MORNING [name], [name], DO Taking Active   liraglutide (VICTOZA) 18 MG/3ML SOPN [id number] No Inject 0.6 mg into the skin daily. 0.6 mg once daily for 1 week,then increase to 1.2 mg once daily,max 1.8  mg [name], [name], DO Taking Active   lisinopril (ZESTRIL) 2.5 MG tablet [id number] No Take 1 tablet (2.5 mg total) by mouth at bedtime. [name], [name], DO Taking Active   metFORMIN (GLUCOPHAGE-XR) 500 MG 24 hr tablet [id number] No Take 1 tablet (500 mg total)  by mouth daily with breakfast. [name], [name], DO Taking Active   rosuvastatin (CRESTOR) 40 MG tablet [id number] No Take 1 tablet (40 mg total) by mouth daily. [name], [name], DO Taking Active   sucralfate (CARAFATE) 1 g tablet [id number] No Take 1 tablet (1 g total) by mouth 4 (four) times daily -  with meals and at bedtime.  Patient not taking: No sig reported    Carmin Muskrat, MD Not Taking Active   tadalafil (CIALIS) 10 MG tablet [id number]  Take 1 tablet (10 mg total) by mouth every other day as needed for erectile dysfunction. Lind Covert, MD  Active             Patient Active Problem List   Diagnosis Date Noted   Chronic right-sided thoracic back pain 11/22/2020   Mood problem 10/16/2020   Tobacco abuse 01/03/2020   Rib pain on right side 01/27/2019   Hypercholesterolemia 08/25/2017   Erectile dysfunction 07/22/2017   DM2 (diabetes mellitus, type 2) ([place]) 09/15/2014   Morbid obesity with BMI of 45.0-49.9, adult ([place]) 12/06/2011    Conditions to be addressed/monitored per PCP order:  Anxiety and Depression  Care Plan : General Social Work (Adult)  Updates made by [name], LCSW since 12/28/2020 12:00 AM     Problem: Depression Identification (Depression)      Long-Range Goal: Depressive Symptoms Identified   Start Date: 11/28/2020  Note:   Timeframe:  Long-Range Goal Priority:  High Start Date:  11/28/20                          Expected End Date:  ongoing                     Follow Up Date 01/16/21   Current barriers:   Chronic Mental Health needs related to depression and anxiety. Patient has been diagnosed with "mood problems" Mental Health Concerns  Needs Support, Education, and Care Coordination in order to meet unmet mental health needs. Clinical Goal(s): patient will work with SW to address concerns related to increasing coping skills to manage mood patient will work with Marlan Palau to address needs related to needing medication management and counseling, referral placed on 11/28/20   Clinical Interventions:  Assessed patient's previous and current treatment, coping skills, support system and barriers to care  Review various resources, discussed options and provided patient information about Discussed several options for long term counseling based on need and insurance Depression screen reviewed ,  Solution-Focused Strategies, Mindfulness or Relaxation Training, Problem [place] , Psychoeducation for mental health needs , Motivational Interviewing, Brief CBT , Verbalization of feelings encouraged , Suicidal Ideation/Homicidal Ideation assessed:, and PHQ2/ PHQ9 completed ; Patient interviewed and appropriate assessments performed Discussed plans with patient for ongoing care management follow up and provided patient with direct contact information for care management team Assisted patient/caregiver with obtaining information about health plan benefits Provided education and assistance to client regarding Advanced Directives. Discussed several options for long term counseling based on need and insurance.  Patient provided [employer] LCSW permission to placed a referral for [facility]. Patient has been scheduled with both a counselor and psychiatrist. Patient reports that these appointments are both tele-health appointments. His psychiatrist appointment is today at 11:00 am and his counseling appointment is on 12/20/20. Patient reports that his counselor did not show up in the waiting room for their virtual scheduled visit. Patient was provided [facility]'s customer service number  and he agreed to contact them to reschedule this appointment. [facility] LCSW sent this care team important update on his [facility] chart.  Inter-disciplinary care team collaboration (see longitudinal plan of care) LCSW discussed coping skills for anxiety and depression management. SW used empathetic and active and reflective listening, validated patient's feelings/concerns, and provided emotional support. LCSW provided self-care examples to help patient manage his multiple health conditions and improve his mood. Patient Goals/Self-Care Activities: Over the next 120 days - barriers to treatment adherence identified - complementary therapy use encouraged - counseling provided - depression screen reviewed - emotional liability  acknowledged and normalized - participation in mental health treatment encouraged - self-awareness of emotional triggers encouraged - strategies to manage emotional triggers promoted - avoid negative self-talk - develop a personal safety plan - develop a plan to deal with triggers like holidays, anniversaries - exercise at least 2 to 3 times per week - have a plan for how to handle bad days - journal feelings and what helps to feel better or worse - spend time or talk with others at least 2 to 3 times per week - spend time or talk with others every day - watch for early signs of feeling worse - write in journal every day - begin personal counseling - call and visit an old friend - check out volunteer opportunities - join a support group - laugh; watch a funny movie or comedian - learn and use visualization or guided imagery - perform a random act of kindness - practice relaxation or meditation daily - start or continue a personal journal - talk about feelings with a friend, family or spiritual advisor - practice positive thinking and self-talk I have placed a referral with [facility] to assist with connecting you with a mental health provider. they will contact you once a provider is located.      Depression screen [facility] 2/9 11/28/2020 11/22/2020 10/16/2020 08/01/2020 03/07/2020  Decreased Interest '2 2 2 2 1  ' Down, Depressed, Hopeless '2 2 2 ' 0 0  PHQ - 2 Score '4 4 4 2 1  ' Altered sleeping '3 2 3 ' 0 2  Tired, decreased energy '2 2 3 3 3  ' Change in appetite 0 0 '1 2 3  ' Feeling bad or failure about yourself  1 1 0 0 0  Trouble concentrating 0 1 0 2 1  Moving slowly or fidgety/restless 0 0 0 0 0  Suicidal thoughts 0 0 0 0 0  PHQ-9 Score '10 10 11 9 10  ' Difficult doing work/chores Very difficult Very difficult Very difficult - -  Some recent data might be hidden    Follow up:  Patient agrees to Care Plan and Follow-up.  Plan: The Managed Medicaid care management team will reach out to the  patient again over the next 30 days.  Date of next scheduled Social Work care management/care coordination outreach:  01/16/21  Eula Fried, BSW, MSW, LCSW Managed Medicaid LCSW [email or web address] Phone: [phone]

## 2020-12-28 NOTE — Patient Instructions (Signed)
Visit Information Patient was given information about Medicaid Managed Care team care coordination services as a part of their [facility] Community Plan Medicaid benefit. Derrick Gonzales verbally consented to engagement with the [facility] Managed Care team.    If you are experiencing a medical emergency, please call 911 or report to your local emergency department or urgent care.    If you have a non-emergency medical problem during routine business hours, please contact your provider's office and ask to speak with a nurse.    For questions related to your [place], please call: [phone] or visit the homepage here: [email or web address]   If you would like to schedule transportation through your [facility], please call the following number at least 2 days in advance of your appointment: [phone].    Call the Behavioral Health Crisis Line at [phone], at any time, 24 hours a day, 7 days a week. If you are in danger or need immediate medical attention call 911.   If you would like help to quit smoking, call 1-800-QUIT-NOW (684-381-0576) OR Espaol: 1-855-Djelo-Ya (4-765-465-0354) o para ms informacin haga clic aqu or Text READY to 656-812 to register via text    Licensed Clinical Social Worker Visit Information  Goals we discussed today:   Goals Addressed             This Visit's Progress    Begin and Stick with Counseling-Depression       Timeframe:  Long-Range Goal Priority:  High Start Date:  11/28/20                          Expected End Date:  ongoing                     Follow Up Date 01/16/21   - check out counseling - keep 90 percent of counseling appointments - schedule counseling appointment    Why is this important?   Beating depression may take some time.  If you don't feel better right away, don't give up on your treatment plan.    Notes:           Derrick Gonzales, BSW, MSW, [employer] Managed Medicaid LCSW [facility]  [facility] [email or web address] Phone: [phone]

## 2021-01-02 ENCOUNTER — Encounter: Payer: Self-pay | Admitting: Student

## 2021-01-02 ENCOUNTER — Ambulatory Visit: Payer: Medicaid Other | Admitting: Student

## 2021-01-02 NOTE — Progress Notes (Deleted)
    SUBJECTIVE:   CHIEF COMPLAINT / HPI:   Per PT:pt will benefit from skilled therapeutic intervention. Lower back pain with bilateral sciatica, thoracic spine pain, muscle weakness and spasms. Pt on baclofen   PERTINENT  PMH / PSH: ***  OBJECTIVE:   There were no vitals taken for this visit.  ***  Physical Exam General: Alert, well appearing, NAD, Oriented x4 Cardiovascular: RRR, No Murmurs, Normal S2/S2 Respiratory: CTAB, No wheezing or Rales Abdomen: No distension or tenderness Extremities: No edema on extremities   Skin: Warm and dry  ASSESSMENT/PLAN:   No problem-specific Assessment & Plan notes found for this encounter.     Jerre Simon, MD [facility]   {    This will disappear when note is signed, click to select method of visit    :1}

## 2021-01-02 NOTE — Progress Notes (Signed)
Error

## 2021-01-10 ENCOUNTER — Emergency Department (HOSPITAL_COMMUNITY)
Admission: EM | Admit: 2021-01-10 | Discharge: 2021-01-10 | Disposition: A | Discharge status: Home / Self Care | Payer: Medicaid Other | Attending: Emergency Medicine | Admitting: Emergency Medicine

## 2021-01-10 ENCOUNTER — Other Ambulatory Visit: Payer: Self-pay

## 2021-01-10 ENCOUNTER — Encounter (HOSPITAL_COMMUNITY): Payer: Self-pay

## 2021-01-10 ENCOUNTER — Emergency Department (HOSPITAL_COMMUNITY): Payer: Medicaid Other

## 2021-01-10 DIAGNOSIS — Z87891 Personal history of nicotine dependence: Secondary | ICD-10-CM | POA: Diagnosis not present

## 2021-01-10 DIAGNOSIS — Z794 Long term (current) use of insulin: Secondary | ICD-10-CM | POA: Insufficient documentation

## 2021-01-10 DIAGNOSIS — M546 Pain in thoracic spine: Secondary | ICD-10-CM | POA: Diagnosis not present

## 2021-01-10 DIAGNOSIS — M549 Dorsalgia, unspecified: Secondary | ICD-10-CM | POA: Diagnosis not present

## 2021-01-10 DIAGNOSIS — Q766 Other congenital malformations of ribs: Secondary | ICD-10-CM | POA: Diagnosis not present

## 2021-01-10 DIAGNOSIS — E119 Type 2 diabetes mellitus without complications: Secondary | ICD-10-CM | POA: Diagnosis not present

## 2021-01-10 MED ORDER — METHOCARBAMOL 500 MG PO TABS
1000.0000 mg | ORAL_TABLET | Freq: Three times a day (TID) | ORAL | 0 refills | Status: DC | PRN
Start: 2021-01-10 — End: 2022-01-15

## 2021-01-10 MED ORDER — NAPROXEN 375 MG PO TABS: 375.0000 mg | ORAL_TABLET | Freq: Two times a day (BID) | ORAL | 0 refills | Status: DC

## 2021-01-10 NOTE — ED Provider Notes (Signed)
[facility] DEPT Provider Note   CSN: [id number] Arrival date & time: 01/10/21  0132     History Chief Complaint  Patient presents with   Back Pain    Derrick Gonzales is a 50 y.o. male.  The history is provided by the patient and medical records.  Back Pain Derrick Gonzales is a 50 y.o. male who presents to the Emergency Department complaining of back pain. He reports one month of right-sided back pain that is atraumatic. Pain is located in the midline lower thoracic spine and just to the right of this area. Pain is worse with certain movements. It feels like a spasm type pain. At times with certain movements he feels like there is a lightning strike that goes from his spine down to his toes. When he feels this he gets tingling in his toes. These pain episodes last 2 to 3 seconds at a time. He is able to walk without reproduction of his pain. No fevers, abdominal pain, nausea, vomiting, night sweats, numbness, weakness, incontinence. No recent surgical procedures, tattoos, dental procedures. He does have a history of diabetes. He is a former smoker. He does have trouble breathing during the pain episodes but this resolved as soon as the pain resolved. He does report on intentional weight loss but he attributes that to his diabetes.  He saw his PCP for this pain and was referred to physical therapy. He feels like his pain has worsened since starting physical therapy.     Past Medical History:  Diagnosis Date   Chest pain on exertion 12/06/2011   Diabetes mellitus    Morbid obesity with BMI of 45.0-49.9, adult ([place]) 12/06/2011    Patient Active Problem List   Diagnosis Date Noted   Chronic right-sided thoracic back pain 11/22/2020   Mood problem 10/16/2020   Tobacco abuse 01/03/2020   Rib pain on right side 01/27/2019   Hypercholesterolemia 08/25/2017   Erectile dysfunction 07/22/2017   DM2 (diabetes mellitus, type 2) ([place]) 09/15/2014   Morbid  obesity with BMI of 45.0-49.9, adult ([place]) 12/06/2011    History reviewed. No pertinent surgical history.     Family History  Problem Relation Age of Onset   Coronary artery disease Father    Heart disease Father    Diabetes Father    Hypertension Mother    Coronary artery disease Other     Social History   Tobacco Use   Smoking status: Former    Types: Cigarettes    Quit date: 11/01/2011    Years since quitting: 9.2   Smokeless tobacco: Never   Tobacco comments:    vaping with zero nicotine  Substance Use Topics   Alcohol use: No    Alcohol/week: 0.0 standard drinks   Drug use: No    Home Medications Prior to Admission medications   Medication Sig Start Date End Date Taking? Authorizing Provider  methocarbamol (ROBAXIN) 500 MG tablet Take 2 tablets (1,000 mg total) by mouth every 8 (eight) hours as needed for muscle spasms. 01/10/21  Yes [name], MD  naproxen (NAPROSYN) 375 MG tablet Take 1 tablet (375 mg total) by mouth 2 (two) times daily with a meal. 01/10/21  Yes [name], MD  Baclofen 5 MG TABS Take 5 mg by mouth at bedtime as needed. 12/05/20   Lind Covert, MD  Continuous Blood Gluc Receiver (DEXCOM G6 RECEIVER) DEVI 1 Device by Does not apply route as directed. Use with Dexcom G6 sensor and transmitter  09/29/19   Leavy [name], RPH-CPP  Continuous Blood Gluc Sensor (DEXCOM G6 SENSOR) MISC INJECT 1 APPLICATOR INTO THE SKIN AS DIRECTED EVERY 10 DAYS 10/19/20   [name], [name], DO  Continuous Blood Gluc Transmit (DEXCOM G6 TRANSMITTER) MISC Inject 1 Device into the skin as directed. Reuse 8 times with sensor changes. 08/01/20   [name], [name], DO  empagliflozin (JARDIANCE) 25 MG TABS tablet Take 1 tablet (25 mg total) by mouth daily before breakfast. 12/05/20   Lind Covert, MD  glucose blood test strip Use as instructed 06/22/14   Chari Manning A, NP  glucose monitoring kit (FREESTYLE) monitoring kit 1 each by Does not apply route  as needed. 06/22/14   [name], NP  Insulin Pen Needle (PEN NEEDLES) 32G X 6 MM MISC 0.6 mLs by Does not apply route daily. 09/03/20   [name], [name], DO  Lancets (FREESTYLE) lancets Use as instructed 06/22/14   [name], NP  LANTUS SOLOSTAR 100 UNIT/ML Solostar Pen ADMINISTER 36 UNITS UNDER THE SKIN EVERY MORNING 11/12/20   [name], [name], DO  liraglutide (VICTOZA) 18 MG/3ML SOPN Inject 0.6 mg into the skin daily. 0.6 mg once daily for 1 week,then increase to 1.2 mg once daily,max 1.8  mg 08/29/20   [name], [name], DO  lisinopril (ZESTRIL) 2.5 MG tablet Take 1 tablet (2.5 mg total) by mouth at bedtime. 12/17/20   [name], MD  metFORMIN (GLUCOPHAGE-XR) 500 MG 24 hr tablet Take 1 tablet (500 mg total) by mouth daily with breakfast. 05/24/20   [name], [name], DO  rosuvastatin (CRESTOR) 40 MG tablet Take 1 tablet (40 mg total) by mouth daily. 02/21/20   [name], [name], DO  sucralfate (CARAFATE) 1 g tablet Take 1 tablet (1 g total) by mouth 4 (four) times daily -  with meals and at bedtime. Patient not taking: No sig reported 10/11/19   Carmin Muskrat, MD  tadalafil (CIALIS) 10 MG tablet Take 1 tablet (10 mg total) by mouth every other day as needed for erectile dysfunction. 12/05/20   Lind Covert, MD    Allergies    Atorvastatin  Review of Systems   Review of Systems  Musculoskeletal:  Positive for back pain.  All other systems reviewed and are negative.  Physical Exam Updated Vital Signs BP (!) 143/80 (BP Location: Left Arm)   Pulse 76   Temp 98.3 F (36.8 C) (Oral)   Resp 20   Ht _0  (1.651 m)   Wt 121 kg   SpO2 97%   BMI 44.39 kg/m   Physical Exam Vitals and nursing note reviewed.  Constitutional:      Appearance: He is well-developed.  HENT:     Head: Normocephalic and atraumatic.  Cardiovascular:     Rate and Rhythm: Normal rate and regular rhythm.     Heart sounds: No murmur heard. Pulmonary:     Effort: Pulmonary  effort is normal. No respiratory distress.     Breath sounds: Normal breath sounds.  Abdominal:     Palpations: Abdomen is soft.     Tenderness: There is no abdominal tenderness. There is no guarding or rebound.  Musculoskeletal:        General: No tenderness.     Comments: No discrete tenderness to palpation over the back. 2+ DP pulses bilaterally. No CVA tenderness.  Skin:    General: Skin is warm and dry.  Neurological:     Mental Status: He is alert  and oriented to person, place, and time.     Comments: Five out of five strength in all four extremities with sensation to light touch intact in all four extremities  Psychiatric:        Behavior: Behavior normal.    ED Results / Procedures / Treatments   Labs (all labs ordered are listed, but only abnormal results are displayed) Labs Reviewed - No data to display  EKG None  Radiology DG Chest 2 View  Result Date: 01/10/2021 CLINICAL DATA:  Back pain EXAM: CHEST - 2 VIEW COMPARISON:  None. FINDINGS: Lungs are well expanded, symmetric, and clear. No pneumothorax or pleural effusion. Cardiac size within normal limits. Pulmonary vascularity is normal. 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 No acute bone abnormality. IMPRESSION: No active cardiopulmonary disease. Electronically Signed   By: Fidela Salisbury M.D.   On: 01/10/2021 03:25   DG Thoracic Spine 2 View  Result Date: 01/10/2021 CLINICAL DATA:  Back pain EXAM: THORACIC SPINE 2 VIEWS COMPARISON:  05/17/2018 FINDINGS: The lower thoracic spine is excluded from view on lateral examination. Findings are correlated with concurrently performed radiograph of the chest. Normal thoracic kyphosis. No acute fracture or listhesis of the thoracic spine. Vertebral body height has been preserved. There is endplate remodeling and osteophyte formation throughout the mid and lower thoracic spine in keeping with changes of diffuse mild to moderate  degenerative disc disease. The paraspinal soft tissues are unremarkable. IMPRESSION: Degenerative disc disease.  No acute fracture or listhesis. Electronically Signed   By: Fidela Salisbury M.D.   On: 01/10/2021 03:27    Procedures Procedures   Medications Ordered in ED Medications - No data to display  ED Course  I have reviewed the triage vital signs and the nursing notes.  Pertinent labs & imaging results that were available during my care of the patient were reviewed by me and considered in my medical decision making (see chart for details).    MDM Rules/Calculators/A&P                          patient here for evaluation of thoracic back pain for the last month. He is neurologically and vascular intact on evaluation. He does have a history of diabetes. No red flags on history or examination. Imaging with degenerative changes. Discussed with patient home care for back pain with PCP follow-up and return precautions.  presentation is not consistent with dissection, PE, pneumonia, epidural abscess  Final Clinical Impression(s) / ED Diagnoses Final diagnoses:  Acute right-sided thoracic back pain    Rx / DC Orders ED Discharge Orders          Ordered    naproxen (NAPROSYN) 375 MG tablet  2 times daily with meals        01/10/21 0447    methocarbamol (ROBAXIN) 500 MG tablet  Every 8 hours PRN        01/10/21 0447             [name], MD 01/10/21 [phone]

## 2021-01-10 NOTE — ED Triage Notes (Signed)
Thoracic back pain x1 month that radiates to bilateral legs. Patient reports MD referred him to physical therapy with no improvement. Patient denies fall/injury.

## 2021-01-16 ENCOUNTER — Other Ambulatory Visit: Payer: Self-pay | Admitting: Licensed Clinical Social Worker

## 2021-01-16 DIAGNOSIS — F489 Nonpsychotic mental disorder, unspecified: Secondary | ICD-10-CM

## 2021-01-16 NOTE — Patient Instructions (Signed)
Visit Information  Mr. Senne was given information about Medicaid Managed Care team care coordination services as a part of their [facility] Community Plan Medicaid benefit. Tawni Pummel Gwin verbally consented to engagement with the [facility] Managed Care team.   If you are experiencing a medical emergency, please call 911 or report to your local emergency department or urgent care.   If you have a non-emergency medical problem during routine business hours, please contact your provider's office and ask to speak with a nurse.   For questions related to your [facility], please call: [phone] or visit the homepage here: [email or web address]  If you would like to schedule transportation through your [facility], please call the following number at least 2 days in advance of your appointment: [phone].   Call the Behavioral Health Crisis Line at [phone], at any time, 24 hours a day, 7 days a week. If you are in danger or need immediate medical attention call 911.  If you would like help to quit smoking, call 1-800-QUIT-NOW (416-510-4500) OR Espaol: 1-855-Djelo-Ya (5-732-202-5427) o para ms informacin haga clic aqu or Text READY to 062-376 to register via text  Mr. Hobbs - following are the goals we discussed in your visit today:   Goals Addressed             This Visit's Progress    Begin and Stick with Counseling-Depression       Timeframe:  Long-Range Goal Priority:  High Start Date:  11/28/20                          Expected End Date:  ongoing                     Follow Up Date 01/30/21   - check out counseling - keep 90 percent of counseling appointments - schedule counseling appointment    Why is this important?   Beating depression may take some time.  If you don't feel better right away, don't give up on your treatment plan.    Notes:          [name], BSW, MSW, [employer] Managed Medicaid LCSW [facility]  [facility] [email or web address] Phone: [phone]

## 2021-01-16 NOTE — Patient Outreach (Signed)
Medicaid Managed Care Social Work Note  01/16/2021 Name:  Derrick Gonzales MRN:  [id number] DOB:  Jun 04, 1970  Derrick Gonzales is an 50 y.o. year old male who is a primary patient of Derrick Bleacher, MD.  The Medicaid Managed Care Coordination team was consulted for assistance with:  [place] and Resources  Derrick Gonzales was given information about Medicaid Managed Care Coordination team services today. [name] Patient agreed to services and verbal consent obtained.  Engaged with patient  for by telephone forfollow up visit in response to referral for case management and/or care coordination services.   Assessments/Interventions:  Review of past medical history, allergies, medications, health status, including review of consultants reports, laboratory and other test data, was performed as part of comprehensive evaluation and provision of chronic care management services.  SDOH: (Social Determinant of Health) assessments and interventions performed: SDOH Interventions    Flowsheet Row Most Recent Value  SDOH Interventions   Stress Interventions Provide Counseling  Depression Interventions/Treatment  Referral to Psychiatry       Advanced Directives Status:  See Care Plan for related entries.  Care Plan                 Allergies  Allergen Reactions   Atorvastatin Other (See Comments)    Migraine Headache    Medications Reviewed Today     Reviewed by Derrick Bleacher, MD (Resident) on 01/02/21 at 1340  Med List Status: <None>   Medication Order Taking? Sig Documenting Provider Last Dose Status Informant  Baclofen 5 MG TABS [id number]  Take 5 mg by mouth at bedtime as needed. Lind Covert, MD  Active   Continuous Blood Gluc Receiver (DEXCOM G6 RECEIVER) DEVI [id number] No 1 Device by Does not apply route as directed. Use with Dexcom G6 sensor and transmitter Leavy [name], RPH-CPP Taking Active Self  Continuous Blood Gluc Sensor (DEXCOM G6  SENSOR) MISC [id number] No INJECT 1 APPLICATOR INTO THE SKIN AS DIRECTED EVERY 10 DAYS [name], [name], DO Taking Active   Continuous Blood Gluc Transmit (DEXCOM G6 TRANSMITTER) MISC [id number] No Inject 1 Device into the skin as directed. Reuse 8 times with sensor changes. [name], [name], DO Taking Active   empagliflozin (JARDIANCE) 25 MG TABS tablet [id number]  Take 1 tablet (25 mg total) by mouth daily before breakfast. Lind Covert, MD  Active   glucose blood test strip [id number] No Use as instructed [name], NP Taking Active Self  glucose monitoring kit (FREESTYLE) monitoring kit [id number] No 1 each by Does not apply route as needed. [name], NP Taking Active Self  Insulin Pen Needle (PEN NEEDLES) 32G X 6 MM MISC [id number] No 0.6 mLs by Does not apply route daily. [name], [name], DO Taking Active   Lancets (FREESTYLE) lancets [id number] No Use as instructed [name], NP Taking Active Self  LANTUS SOLOSTAR 100 UNIT/ML Solostar Pen [id number] No ADMINISTER 36 UNITS UNDER THE SKIN EVERY MORNING [name], [name], DO Taking Active   liraglutide (VICTOZA) 18 MG/3ML SOPN [id number] No Inject 0.6 mg into the skin daily. 0.6 mg once daily for 1 week,then increase to 1.2 mg once daily,max 1.8  mg [name], [name], DO Taking Active   lisinopril (ZESTRIL) 2.5 MG tablet [id number]  Take 1 tablet (2.5 mg total) by mouth at bedtime. Derrick Bleacher, MD  Active   metFORMIN (GLUCOPHAGE-XR) 500 MG 24 hr tablet [id number] No Take 1 tablet (500 mg total) by mouth  daily with breakfast. [name], [name], DO Taking Active   rosuvastatin (CRESTOR) 40 MG tablet [id number] No Take 1 tablet (40 mg total) by mouth daily. [name], [name], DO Taking Active   sucralfate (CARAFATE) 1 g tablet [id number] No Take 1 tablet (1 g total) by mouth 4 (four) times daily -  with meals and at bedtime.  Patient not taking: No sig reported   Carmin Muskrat, MD Not Taking Active    tadalafil (CIALIS) 10 MG tablet [id number]  Take 1 tablet (10 mg total) by mouth every other day as needed for erectile dysfunction. Lind Covert, MD  Active             Patient Active Problem List   Diagnosis Date Noted   Chronic right-sided thoracic back pain 11/22/2020   Mood problem 10/16/2020   Tobacco abuse 01/03/2020   Rib pain on right side 01/27/2019   Hypercholesterolemia 08/25/2017   Erectile dysfunction 07/22/2017   DM2 (diabetes mellitus, type 2) ([place]) 09/15/2014   Morbid obesity with BMI of 45.0-49.9, adult ([place]) 12/06/2011    Conditions to be addressed/monitored per PCP order:  Depression  Care Plan : General Social Work (Adult)  Updates made by [name], LCSW since 01/16/2021 12:00 AM     Problem: Depression Identification (Depression)      Long-Range Goal: Depressive Symptoms Identified   Start Date: 11/28/2020  Note:   Timeframe:  Long-Range Goal Priority:  High Start Date:  11/28/20                          Expected End Date:  ongoing                     Follow Up Date 01/30/21   Current barriers:   Chronic Mental Health needs related to depression and anxiety. Patient has been diagnosed with "mood problems" Mental Health Concerns  Needs Support, Education, and Care Coordination in order to meet unmet mental health needs. Clinical Goal(s): patient will work with SW to address concerns related to increasing coping skills to manage mood patient will work with [facility] to address needs related to needing medication management and counseling, referral placed on 01/16/21   Clinical Interventions:  Assessed patient's previous and current treatment, coping skills, support system and barriers to care  Review various resources, discussed options and provided patient information about Discussed several options for long term counseling based on need and insurance Depression screen reviewed , Solution-Focused Strategies, Mindfulness or Relaxation  Training, Problem Thatcher , Psychoeducation for mental health needs , Motivational Interviewing, Brief CBT , Verbalization of feelings encouraged , Suicidal Ideation/Homicidal Ideation assessed:, and PHQ2/ PHQ9 completed ; Patient interviewed and appropriate assessments performed Discussed plans with patient for ongoing care management follow up and provided patient with direct contact information for care management team Assisted patient/caregiver with obtaining information about health plan benefits Provided education and assistance to client regarding Advanced Directives. Discussed several options for long term counseling based on need and insurance.  Patient provided [facility] LCSW permission to placed a referral for [facility]. Patient has been scheduled with both a counselor and psychiatrist. Patient reports that these appointments are both tele-health appointments. His psychiatrist appointment is today at 11:00 am and his counseling appointment is on 12/20/20. Patient reports that his counselor did not show up in the waiting room for their virtual scheduled visit. Patient was provided [facility]'s customer service number and he agreed to  contact them to reschedule this appointment. [facility] LCSW sent this care team important update on his [place] chart. UPDATE 01/16/21- Patient no longer wishes to use [facility] and would prefer to medication management and counseling with someone closer. Referral made to [facility] on 01/16/21. Inter-disciplinary care team collaboration (see longitudinal plan of care) LCSW discussed coping skills for anxiety and depression management. SW used empathetic and active and reflective listening, validated patient's feelings/concerns, and provided emotional support. LCSW provided self-care examples to help patient manage his multiple health conditions and improve his mood. Patient Goals/Self-Care Activities: Over the next 120 days - barriers to treatment adherence identified -  complementary therapy use encouraged - counseling provided - depression screen reviewed - emotional liability acknowledged and normalized - participation in mental health treatment encouraged - self-awareness of emotional triggers encouraged - strategies to manage emotional triggers promoted - avoid negative self-talk - develop a personal safety plan - develop a plan to deal with triggers like holidays, anniversaries - exercise at least 2 to 3 times per week - have a plan for how to handle bad days - journal feelings and what helps to feel better or worse - spend time or talk with others at least 2 to 3 times per week - spend time or talk with others every day - watch for early signs of feeling worse - write in journal every day - begin personal counseling - call and visit an old friend - check out volunteer opportunities - join a support group - laugh; watch a funny movie or comedian - learn and use visualization or guided imagery - perform a random act of kindness - practice relaxation or meditation daily - start or continue a personal journal - talk about feelings with a friend, family or spiritual advisor - practice positive thinking and self-talk I have placed a referral to [facility] on 01/16/21      Follow up:  Patient agrees to Care Plan and Follow-up.  Plan: The Managed Medicaid care management team will reach out to the patient again over the next 30 days.  Date of next scheduled Social Work care management/care coordination outreach:  01/30/21  Eula Fried, BSW, MSW, LCSW Managed Medicaid LCSW [email or web address] Phone: [phone]

## 2021-01-24 ENCOUNTER — Ambulatory Visit (INDEPENDENT_AMBULATORY_CARE_PROVIDER_SITE_OTHER): Payer: Medicaid Other | Admitting: Student

## 2021-01-24 ENCOUNTER — Encounter: Payer: Self-pay | Admitting: Student

## 2021-01-24 ENCOUNTER — Other Ambulatory Visit: Payer: Self-pay

## 2021-01-24 VITALS — BP 130/71 | HR 91 | Wt 263.1 lb

## 2021-01-24 DIAGNOSIS — G8929 Other chronic pain: Secondary | ICD-10-CM | POA: Diagnosis not present

## 2021-01-24 DIAGNOSIS — M546 Pain in thoracic spine: Secondary | ICD-10-CM | POA: Diagnosis not present

## 2021-01-24 MED ORDER — DICLOFENAC SODIUM 1 % EX GEL: 4.0000 g | Freq: Four times a day (QID) | CUTANEOUS | Status: DC

## 2021-01-24 NOTE — Patient Instructions (Signed)
It was wonderful to see you today. Thank you for allowing me to be a part of your care. Below is a short summary of what we discussed at your visit today:  You remain in pain despite physical therapy and pain hasn't improved.   I recommend taking baclofen before each physical therapy visit to help with the pain during the activities. Continue with the physical therapy.  I recommend exercise and weight lose which will help relief some of there strain on the back.   I have put in order for Voltaren gel to be applied as needed    If you have any questions or concerns, please do not hesitate to contact us via phone or MyChart message.   Jerre Simon, MD [facility]

## 2021-01-24 NOTE — Progress Notes (Signed)
    SUBJECTIVE:   CHIEF COMPLAINT / HPI:   Mr. Rathbone is a 50 year old male with hx of T2DM, Obesity and Chronic back pain presents today for back pain follow up.  Patient said his back pain has remained unchanged following 3 physical therapy encounters.  The pain is always worse after each encounter with 3 different physical therapist.  He stopped doing physical therapy and had a recent visit to the ED due to worsening back pain.  In the ED, his x-ray showed degenerative disc disease.  Patient describes his pain as a sharp pain that occasionally shoots down both legs and sometimes will cause numbness and tingling sensation on his toes.  Pain is currently 7 out of 10 and usually worse on any movements.  Nothing improves his pain, baclofen provides minimal relief.  He denies any saddle anesthesia, no fever, bowel or urinary incontinence.  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 The car seats is uncomfortable requires bending to get under the truck.  PERTINENT  PMH / PSH:  Past Medical History:  Diagnosis Date   Chest pain on exertion 12/06/2011   Diabetes mellitus    Morbid obesity with BMI of 45.0-49.9, adult (HCC) 12/06/2011   Review of Systems  Constitutional:  Negative for chills, fever and weight loss.  Respiratory:  Negative for cough, hemoptysis, shortness of breath and wheezing.   Cardiovascular:  Negative for chest pain, palpitations and leg swelling.  Gastrointestinal:  Negative for abdominal pain, heartburn and nausea.  Genitourinary:  Negative for dysuria, flank pain, frequency and urgency.  Musculoskeletal:  Positive for back pain. Negative for falls and neck pain.  Neurological:  Negative for dizziness, tingling and headaches.    OBJECTIVE:   BP 130/71   Pulse 91   Wt 119.4 kg   SpO2 96%   BMI 43.79 kg/m    Physical Exam General: Alert, well appearing, NAD,  Oriented x4 Cardiovascular: RRR, No Murmurs, Normal S2/S2 Respiratory: CTAB, No wheezing or Rales Abdomen: No distension or tenderness Extremities: No edema on extremities, muscle strength is 5/5 on UE and LE.   Musculoskeletal: Positive right upper back tenderness and tightness Skin: Warm and dry  ASSESSMENT/PLAN:   Chronic right-sided thoracic back pain Patient with functional pain rated 7/10. Pain radiate to LE bilaterally with positive paresthesia. His pain remains unchanged after 3 physical therapy appointments. He is positive for right upper back tenderness on physical exam. 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 Other possible differentials included, herniated disc, muscle spasm, spinal stenosis & ankylosing spondylitis. -Continue Baclofen 5g  -Recommend taking baclofen before each physical therapy appointment -Resent a Physical therapy referral -Advise patient to apply Voltaren Gel at affected site PRN  -Follow up in a month      Jerre Simon, MD [facility]

## 2021-01-24 NOTE — Assessment & Plan Note (Signed)
Patient with functional pain rated 7/10. Pain radiate to LE bilaterally with positive paresthesia. His pain remains unchanged after 3 physical therapy appointments. He is positive for right upper back tenderness on physical exam. 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 Other possible differentials included, herniated disc, muscle spasm, spinal stenosis & ankylosing spondylitis. -Continue Baclofen 5g  -Recommend taking baclofen before each physical therapy appointment -Resent a Physical therapy referral -Advise patient to apply Voltaren Gel at affected site PRN  -Follow up in a month

## 2021-01-29 ENCOUNTER — Ambulatory Visit: Payer: Medicaid Other | Admitting: Podiatry

## 2021-01-30 ENCOUNTER — Other Ambulatory Visit: Payer: Self-pay

## 2021-01-31 ENCOUNTER — Telehealth: Payer: Self-pay | Admitting: Licensed Clinical Social Worker

## 2021-01-31 NOTE — Patient Outreach (Signed)
[facility]) Care Management  01/31/2021  Derrick Gonzales [birth year] [id number]  LCSW completed [facility] outreach attempt today but was unable to reach patient successfully. A HIPPA compliant voice message was left encouraging patient to return call once available. LCSW will ask Scheduling Care Guide to reschedule [facility] SW appointment with patient as well.  [name], BSW, MSW, [employer] Managed Medicaid LCSW [facility]  [facility] [email or web address] Phone: [phone]

## 2021-01-31 NOTE — Patient Instructions (Signed)
Ernestene Kiel ,   The [facility] Managed Care Team is available to provide assistance to you with your healthcare needs at no cost and as a benefit of your [facility] Health plan. I'm sorry I was unable to reach you today for our scheduled appointment. Our care guide will call you to reschedule our telephone appointment. Please call me at the number below. I am available to be of assistance to you regarding your healthcare needs. .   Thank you,   [name], BSW, MSW, LCSW Managed Medicaid LCSW [facility]  [email or web address] Phone: [phone]

## 2021-01-31 NOTE — Patient Outreach (Signed)
[facility]) Care Management  01/31/2021  BRAIDYN SCORSONE [birth year] [id number]  Erroneous encounter  [name], BSW, MSW, LCSW Managed Medicaid LCSW [facility]  [facility] [email or web address] Phone: [phone]

## 2021-02-06 ENCOUNTER — Other Ambulatory Visit: Payer: Self-pay | Admitting: Licensed Clinical Social Worker

## 2021-02-06 ENCOUNTER — Other Ambulatory Visit: Payer: Medicaid Other | Admitting: Licensed Clinical Social Worker

## 2021-02-06 NOTE — Patient Instructions (Signed)
Visit Information  Derrick Gonzales was given information about Medicaid Managed Care team care coordination services as a part of their [facility] Community Plan Medicaid benefit. Derrick Gonzales verbally consented to engagement with the [facility] Managed Care team.   If you are experiencing a medical emergency, please call 911 or report to your local emergency department or urgent care.   If you have a non-emergency medical problem during routine business hours, please contact your provider's office and ask to speak with a nurse.   For questions related to your [facility], please call: [phone] or visit the homepage here: [email or web address]  If you would like to schedule transportation through your [facility], please call the following number at least 2 days in advance of your appointment: [phone].   Call the Behavioral Health Crisis Line at [phone], at any time, 24 hours a day, 7 days a week. If you are in danger or need immediate medical attention call 911.  If you would like help to quit smoking, call 1-800-QUIT-NOW (934 068 3281) OR Espaol: 1-855-Djelo-Ya (8-366-294-7654) o para ms informacin haga clic aqu or Text READY to 650-354 to register via text  Derrick Gonzales - following are the goals we discussed in your visit today:   Goals Addressed             This Visit's Progress    Begin and Stick with Counseling-Depression       Timeframe:  Long-Range Goal Priority:  High Start Date:  11/28/20                          Expected End Date:  ongoing                     Follow Up Date 03/13/21   - check out counseling - keep 90 percent of counseling appointments - schedule counseling appointment    Why is this important?   Beating depression may take some time.  If you don't feel better right away, don't give up on your treatment plan.    Notes:          Derrick Gonzales, Derrick Gonzales, Derrick Gonzales, [employer] Managed Medicaid LCSW [facility]  [facility] [email or web address] Phone: [phone]

## 2021-02-06 NOTE — Patient Outreach (Signed)
[facility]) Care Management  02/06/2021  EIAN VANDERVELDEN [birth year] [id number]  Erroneous encounter.  [name], BSW, MSW, [employer] Managed Medicaid LCSW [facility]  [facility] [email or web address] Phone: [phone]

## 2021-02-06 NOTE — Progress Notes (Signed)
Derrick Gonzales,   This patient was contacted on 9/1 at 2:30. I was not able to reach him, but I did leave a voicemail. He can feel free to give [name] a call if he'd like at [phone] or come in as a walk-in Monday-Wednesday 8am-11am or Friday 1pm-4pm.  Thanks <3

## 2021-02-06 NOTE — Patient Outreach (Signed)
Medicaid Managed Care Social Work Note  02/06/2021 Name:  Derrick Gonzales MRN:  [id number] DOB:  [birth year]  Derrick Gonzales is an 50 y.o. year old male who is a primary Gonzales of [name], MD.  The Medicaid Managed Care Coordination team was consulted for assistance with:  [place] and Resources  Derrick Gonzales was given information about Medicaid Managed Care Coordination team services today. Derrick Gonzales agreed to services and verbal consent obtained.  Engaged with Gonzales  for by telephone forfollow up visit in response to referral for case management and/or care coordination services.   Assessments/Interventions:  Review of past medical history, allergies, medications, health status, including review of consultants reports, laboratory and other test data, was performed as part of comprehensive evaluation and provision of chronic care management services.  SDOH: (Social Determinant of Health) assessments and interventions performed: SDOH Interventions    Flowsheet Row Most Recent Value  SDOH Interventions   Stress Interventions Provide Counseling  Depression Interventions/Treatment  Referral to Psychiatry       Advanced Directives Status:  Not addressed in this encounter.  Care Plan                 Allergies  Allergen Reactions   Atorvastatin Other (See Comments)    Migraine Headache    Medications Reviewed Today     Reviewed by [name], MD (Resident) on 01/24/21 at [place] List Status: <None>   Medication Order Taking? Sig Documenting Provider Last Dose Status Informant  Baclofen 5 MG TABS [id number]  Take 5 mg by mouth at bedtime as needed. Lind Covert, MD  Active   Continuous Blood Gluc Receiver (DEXCOM G6 RECEIVER) DEVI [id number] No 1 Device by Does not apply route as directed. Use with Dexcom G6 sensor and transmitter Leavy [name], RPH-CPP Taking Active Self  Continuous Blood Gluc Sensor (DEXCOM G6 SENSOR)  MISC [id number] No INJECT 1 APPLICATOR INTO THE SKIN AS DIRECTED EVERY 10 DAYS [name], [name], DO Taking Active   Continuous Blood Gluc Transmit (DEXCOM G6 TRANSMITTER) MISC [id number] No Inject 1 Device into the skin as directed. Reuse 8 times with sensor changes. [name], [name], DO Taking Active   diclofenac Sodium (VOLTAREN) 1 % topical gel 4 g [id number]   [name], MD  Active   empagliflozin (JARDIANCE) 25 MG TABS tablet [id number]  Take 1 tablet (25 mg total) by mouth daily before breakfast. Lind Covert, MD  Active   glucose blood test strip [id number] No Use as instructed [name], NP Taking Active Self  glucose monitoring kit (FREESTYLE) monitoring kit [id number] No 1 each by Does not apply route as needed. [name], NP Taking Active Self  Insulin Pen Needle (PEN NEEDLES) 32G X 6 MM MISC [id number] No 0.6 mLs by Does not apply route daily. [name], [name], DO Taking Active   Lancets (FREESTYLE) lancets [id number] No Use as instructed [name], NP Taking Active Self  LANTUS SOLOSTAR 100 UNIT/ML Solostar Pen [id number] No ADMINISTER 36 UNITS UNDER THE SKIN EVERY MORNING [name], [name], DO Taking Active   liraglutide (VICTOZA) 18 MG/3ML SOPN [id number] No Inject 0.6 mg into the skin daily. 0.6 mg once daily for 1 week,then increase to 1.2 mg once daily,max 1.8  mg [name], [name], DO Taking Active   lisinopril (ZESTRIL) 2.5 MG tablet [id number]  Take 1 tablet (2.5 mg total) by mouth at bedtime. [name], MD  Active  metFORMIN (GLUCOPHAGE-XR) 500 MG 24 hr tablet [id number] No Take 1 tablet (500 mg total) by mouth daily with breakfast. [name], [name], DO Taking Active   methocarbamol (ROBAXIN) 500 MG tablet [id number]  Take 2 tablets (1,000 mg total) by mouth every 8 (eight) hours as needed for muscle spasms. [name], MD  Active   naproxen (NAPROSYN) 375 MG tablet [id number]  Take 1 tablet (375 mg total) by mouth 2 (two) times  daily with a meal. [name], MD  Active   rosuvastatin (CRESTOR) 40 MG tablet [id number] No Take 1 tablet (40 mg total) by mouth daily. [name], [name], DO Taking Active   sucralfate (CARAFATE) 1 g tablet [id number] No Take 1 tablet (1 g total) by mouth 4 (four) times daily -  with meals and at bedtime.  Gonzales not taking: No sig reported   Carmin Muskrat, MD Not Taking Active   tadalafil (CIALIS) 10 MG tablet [id number]  Take 1 tablet (10 mg total) by mouth every other day as needed for erectile dysfunction. Lind Covert, MD  Active             Gonzales Active Problem List   Diagnosis Date Noted   Chronic right-sided thoracic back pain 11/22/2020   Mood problem 10/16/2020   Tobacco abuse 01/03/2020   Rib pain on right side 01/27/2019   Hypercholesterolemia 08/25/2017   Erectile dysfunction 07/22/2017   DM2 (diabetes mellitus, type 2) ([place]) 09/15/2014   Morbid obesity with BMI of 45.0-49.9, adult ([place]) 12/06/2011    Conditions to be addressed/monitored per PCP order:  Anxiety and Depression  Care Plan : General Social Work (Adult)  Updates made by [name], LCSW since 02/06/2021 12:00 AM     Problem: Depression Identification (Depression)      Long-Range Goal: Depressive Symptoms Identified   Start Date: 11/28/2020  Priority: High  Note:   Timeframe:  Long-Range Goal Priority:  High Start Date:  11/28/20                          Expected End Date:  ongoing                     Follow Up Date 03/13/21   Current barriers:   Chronic Mental Health needs related to depression and anxiety. Gonzales has been diagnosed with "mood problems" Mental Health Concerns  Needs Support, Education, and Care Coordination in order to meet unmet mental health needs. Clinical Goal(s): Gonzales will work with SW to address concerns related to increasing coping skills to manage mood Gonzales will work with [facility] to address needs related to needing medication management  and counseling, referral placed on 01/16/21  but no follow up has been made since referral was created.  Clinical Interventions:  Assessed Gonzales's previous and current treatment, coping skills, support system and barriers to care  Review various resources, discussed options and provided Gonzales information about Discussed several options for long term counseling based on need and insurance Depression screen reviewed , Solution-Focused Strategies, Mindfulness or Relaxation Training, Problem [place] , Psychoeducation for mental health needs , Motivational Interviewing, Brief CBT , Verbalization of feelings encouraged , Suicidal Ideation/Homicidal Ideation assessed:, and PHQ2/ PHQ9 completed ; Gonzales interviewed and appropriate assessments performed Discussed plans with Gonzales for ongoing care management follow up and provided Gonzales with direct contact information for care management team Assisted Gonzales/caregiver with obtaining information about health plan benefits Provided education and  assistance to client regarding Advanced Directives. Discussed several options for long term counseling based on need and insurance.  Gonzales provided [facility] LCSW permission to placed a referral for [facility]. Gonzales has been scheduled with both a counselor and psychiatrist. Gonzales reports that these appointments are both tele-health appointments. His psychiatrist appointment is today at 11:00 am and his counseling appointment is on 12/20/20. Gonzales reports that his counselor did not show up in the waiting room for their virtual scheduled visit. Gonzales was provided [facility]'s customer service number and he agreed to contact them to reschedule this appointment. [facility] LCSW sent this care team important update on his [place] chart. UPDATE 01/16/21- Gonzales no longer wishes to use [facility] and would prefer to medication management and counseling with someone closer. Referral made to [facility] on 01/16/21. Gonzales reports  that he has not heard from [facility]. [facility] LCSW will send an urgent message to [facility] staff on 02/06/21. Inter-disciplinary care team collaboration (see longitudinal plan of care) LCSW discussed coping skills for anxiety and depression management. SW used empathetic and active and reflective listening, validated Gonzales's feelings/concerns, and provided emotional support. LCSW provided self-care examples to help Gonzales manage his multiple health conditions and improve his mood. Gonzales Goals/Self-Care Activities: Over the next 120 days - barriers to treatment adherence identified - complementary therapy use encouraged - counseling provided - depression screen reviewed - emotional liability acknowledged and normalized - participation in mental health treatment encouraged - self-awareness of emotional triggers encouraged - strategies to manage emotional triggers promoted - avoid negative self-talk - develop a personal safety plan - develop a plan to deal with triggers like holidays, anniversaries - exercise at least 2 to 3 times per week - have a plan for how to handle bad days - journal feelings and what helps to feel better or worse - spend time or talk with others at least 2 to 3 times per week - spend time or talk with others every day - watch for early signs of feeling worse - write in journal every day - begin personal counseling - call and visit an old friend - check out volunteer opportunities - join a support group - laugh; watch a funny movie or comedian - learn and use visualization or guided imagery - perform a random act of kindness - practice relaxation or meditation daily - start or continue a personal journal - talk about feelings with a friend, family or spiritual advisor - practice positive thinking and self-talk I have placed a referral to [facility] on 01/16/21      Follow up:  Gonzales agrees to Care Plan and Follow-up.  Plan: The Managed Medicaid care management team  will reach out to the Gonzales again over the next 45 days.  Date of next scheduled Social Work care management/care coordination outreach:  03/13/21  Eula Fried, BSW, MSW, LCSW Managed Medicaid LCSW [email or web address] Phone: [phone]

## 2021-02-11 ENCOUNTER — Ambulatory Visit: Payer: Medicaid Other | Attending: Family Medicine | Admitting: Physical Therapy

## 2021-02-19 ENCOUNTER — Telehealth: Payer: Self-pay

## 2021-02-19 NOTE — Telephone Encounter (Signed)
Received fax from pharmacy, PA needed on Dexcom transmitter.  Clinical questions submitted via Cover My Meds.  Waiting on response, could take up to 72 hours.  Cover My Meds info: Key: [id number]  Veronda Prude, RN

## 2021-03-13 ENCOUNTER — Other Ambulatory Visit: Payer: Self-pay | Admitting: Licensed Clinical Social Worker

## 2021-03-13 NOTE — Patient Instructions (Signed)
Visit Information  Derrick Gonzales was given information about Medicaid Managed Care team care coordination services as a part of their [facility] Community Plan Medicaid benefit. Derrick Gonzales verbally consented to engagement with the [facility] Managed Care team.   If you are experiencing a medical emergency, please call 911 or report to your local emergency department or urgent care.   If you have a non-emergency medical problem during routine business hours, please contact your provider's office and ask to speak with a nurse.   For questions related to your [facility], please call: [phone] or visit the homepage here: [email or web address]  If you would like to schedule transportation through your [facility], please call the following number at least 2 days in advance of your appointment: [phone].   Call the Behavioral Health Crisis Line at [phone], at any time, 24 hours a day, 7 days a week. If you are in danger or need immediate medical attention call 911.  If you would like help to quit smoking, call 1-800-QUIT-NOW ((380)392-6277) OR Espaol: 1-855-Djelo-Ya (4-481-856-3149) o para ms informacin haga clic aqu or Text READY to 702-637 to register via text  Derrick Gonzales - following are the goals we discussed in your visit today:   Goals Addressed             This Visit's Progress    Begin and Stick with Counseling-Depression       Timeframe:  Long-Range Goal Priority:  High Start Date:  11/28/20                          Expected End Date:  ongoing                     Follow Up Date 03/26/21   - check out counseling - keep 90 percent of counseling appointments - schedule counseling appointment    Why is this important?   Beating depression may take some time.  If you don't feel better right away, don't give up on your treatment plan.    Notes:          Derrick Gonzales, Derrick Gonzales, Derrick Gonzales, Derrick Gonzales [facility]  [facility] [email or web address] Phone: [phone]

## 2021-03-13 NOTE — Patient Outreach (Signed)
Medicaid Managed Care Social Work Note  03/13/2021 Name:  Derrick Gonzales MRN:  [id number] DOB:  04/07/1971  Derrick Gonzales is an 50 y.o. year old male who is a primary patient of Norbert, John, MD.  The Medicaid Managed Care Coordination team was consulted for assistance with:  Mental Health Counseling and Resources  Derrick Gonzales was given information about Medicaid Managed Care Coordination team services today. Derrick Gonzales Patient agreed to services and verbal consent obtained.  Engaged with patient  for by telephone forfollow up visit in response to referral for case management and/or care coordination services.   Assessments/Interventions:  Review of past medical history, allergies, medications, health status, including review of consultants reports, laboratory and other test data, was performed as part of comprehensive evaluation and provision of chronic care management services.  SDOH: (Social Determinant of Health) assessments and interventions performed: SDOH Interventions    Flowsheet Row Most Recent Value  SDOH Interventions   Stress Interventions Provide Counseling, Offered Community Wellness Resources  Depression Interventions/Treatment  Referral to Psychiatry       Advanced Directives Status:  See Care Plan for related entries.  Care Plan                 Allergies  Allergen Reactions   Atorvastatin Other (See Comments)    Migraine Headache    Medications Reviewed Today     Reviewed by Norbert, John, MD (Resident) on 01/24/21 at 1811  Med List Status: <None>   Medication Order Taking? Sig Documenting Provider Last Dose Status Informant  Baclofen 5 MG TABS [id number]  Take 5 mg by mouth at bedtime as needed. Chambliss, Marshall L, MD  Active   Continuous Blood Gluc Receiver (DEXCOM G6 RECEIVER) DEVI [id number] No 1 Device by Does not apply route as directed. Use with Dexcom G6 sensor and transmitter Koval, Peter G, RPH-CPP Taking Active Self   Continuous Blood Gluc Sensor (DEXCOM G6 SENSOR) MISC [id number] No INJECT 1 APPLICATOR INTO THE SKIN AS DIRECTED EVERY 10 DAYS [name], Bailey J, DO Taking Active   Continuous Blood Gluc Transmit (DEXCOM G6 TRANSMITTER) MISC [id number] No Inject 1 Device into the skin as directed. Reuse 8 times with sensor changes. [name], Bailey J, DO Taking Active   diclofenac Sodium (VOLTAREN) 1 % topical gel 4 g [id number]   Norbert, John, MD  Active   empagliflozin (JARDIANCE) 25 MG TABS tablet [id number]  Take 1 tablet (25 mg total) by mouth daily before breakfast. Chambliss, Marshall L, MD  Active   glucose blood test strip [id number] No Use as instructed Keck, Valerie A, NP Taking Active Self  glucose monitoring kit (FREESTYLE) monitoring kit [id number] No 1 each by Does not apply route as needed. Keck, Valerie A, NP Taking Active Self  Insulin Pen Needle (PEN NEEDLES) 32G X 6 MM MISC [id number] No 0.6 mLs by Does not apply route daily. [name], Bailey J, DO Taking Active   Lancets (FREESTYLE) lancets [id number] No Use as instructed Keck, Valerie A, NP Taking Active Self  LANTUS SOLOSTAR 100 UNIT/ML Solostar Pen [id number] No ADMINISTER 36 UNITS UNDER THE SKIN EVERY MORNING [name], Bailey J, DO Taking Active   liraglutide (VICTOZA) 18 MG/3ML SOPN [id number] No Inject 0.6 mg into the skin daily. 0.6 mg once daily for 1 week,then increase to 1.2 mg once daily,max 1.8  mg [name], Bailey J, DO Taking Active   lisinopril (ZESTRIL) 2.5 MG tablet [id number]  Take 1 tablet (2.5 mg total) by mouth at bedtime. Norbert,   Jenny Reichmann, MD  Active   metFORMIN (GLUCOPHAGE-XR) 500 MG 24 hr tablet [id number] No Take 1 tablet (500 mg total) by mouth daily with breakfast. [name], [name], DO Taking Active   methocarbamol (ROBAXIN) 500 MG tablet [id number]  Take 2 tablets (1,000 mg total) by mouth every 8 (eight) hours as needed for muscle spasms. [name], MD  Active   naproxen (NAPROSYN) 375 MG tablet [id number]   Take 1 tablet (375 mg total) by mouth 2 (two) times daily with a meal. [name], MD  Active   rosuvastatin (CRESTOR) 40 MG tablet [id number] No Take 1 tablet (40 mg total) by mouth daily. [name], [name], DO Taking Active   sucralfate (CARAFATE) 1 g tablet [id number] No Take 1 tablet (1 g total) by mouth 4 (four) times daily -  with meals and at bedtime.  Patient not taking: No sig reported   Carmin Muskrat, MD Not Taking Active   tadalafil (CIALIS) 10 MG tablet [id number]  Take 1 tablet (10 mg total) by mouth every other day as needed for erectile dysfunction. Lind Covert, MD  Active             Patient Active Problem List   Diagnosis Date Noted   Chronic right-sided thoracic back pain 11/22/2020   Mood problem 10/16/2020   Tobacco abuse 01/03/2020   Rib pain on right side 01/27/2019   Hypercholesterolemia 08/25/2017   Erectile dysfunction 07/22/2017   DM2 (diabetes mellitus, type 2) ([place]) 09/15/2014   Morbid obesity with BMI of 45.0-49.9, adult ([place]) 12/06/2011    Conditions to be addressed/monitored per PCP order:  Anxiety and Depression  Care Plan : General Social Work (Adult)  Updates made by [name], LCSW since 03/13/2021 12:00 AM     Problem: Depression Identification (Depression)      Long-Range Goal: Depressive Symptoms Identified   Start Date: 11/28/2020  Priority: High  Note:   Timeframe:  Long-Range Goal Priority:  High Start Date:  11/28/20                          Expected End Date:  ongoing                     Follow Up Date 03/26/21   Current barriers:   Chronic Mental Health needs related to depression and anxiety. Patient has been diagnosed with "mood problems" Mental Health Concerns  Needs Support, Education, and Care Coordination in order to meet unmet mental health needs. Clinical Goal(s): patient will work with SW to address concerns related to increasing coping skills to manage mood patient will work with Derrick Gonzales to  address needs related to needing medication management and counseling, referral placed on 01/16/21  but no follow up has been made since referral was created.  Clinical Interventions:  Assessed patient's previous and current treatment, coping skills, support system and barriers to care  Review various resources, discussed options and provided patient information about Discussed several options for long term counseling based on need and insurance Depression screen reviewed , Solution-Focused Strategies, Mindfulness or Relaxation Training, Problem [place] , Psychoeducation for mental health needs , Motivational Interviewing, Brief CBT , Verbalization of feelings encouraged , Suicidal Ideation/Homicidal Ideation assessed:, and PHQ2/ PHQ9 completed ; Patient interviewed and appropriate assessments performed Discussed plans with patient for ongoing care management follow up and provided patient with direct contact information for care management team Assisted patient/caregiver with obtaining information about  health plan benefits Provided education and assistance to client regarding Advanced Directives. Discussed several options for long term counseling based on need and insurance.  Patient provided [facility] LCSW permission to placed a referral for [facility]. Patient has been scheduled with both a counselor and psychiatrist. Patient reports that these appointments are both tele-health appointments. His psychiatrist appointment is today at 11:00 am and his counseling appointment is on 12/20/20. Patient reports that his counselor did not show up in the waiting room for their virtual scheduled visit. Patient was provided [facility]'s customer service number and he agreed to contact them to reschedule this appointment. [facility] LCSW sent this care team important update on his [facility] chart. UPDATE 01/16/21- Patient no longer wishes to use [facility] and would prefer to medication management and counseling with someone  closer. Referral made to [facility] on 01/16/21. Patient reports that he has not heard from [facility]. [facility] LCSW will send an urgent message to [facility] staff on 02/06/21 and was informed that practice was unable to reach patient and for him to please contact them back. Update 03/03/21- Patient is agreeable to contact [facility] directly and will consider using their walk in clinic as well.  Inter-disciplinary care team collaboration (see longitudinal plan of care) LCSW discussed coping skills for anxiety and depression management. SW used empathetic and active and reflective listening, validated patient's feelings/concerns, and provided emotional support. LCSW provided self-care examples to help patient manage his multiple health conditions and improve his mood. 10 LITTLE Things To Do When You're Feeling Too Down To Do Anything  Take a shower. Even if you plan to stay in all day long and not see a soul, take a shower. It takes the most effort to hop in to the shower but once you do, you'll feel immediate results. It will wake you up and you'll be feeling much fresher (and cleaner too).  Brush and floss your teeth. Give your teeth a good brushing with a floss finish. It's a small task but it feels so good and you can check 'taking care of your health' off the list of things to do.  Do something small on your list. Most of us have some small thing we would like to get done (load of laundry, sew a button, email a friend). Doing one of these things will make you feel like you've accomplished something.  Drink water. Drinking water is easy right? It's also really beneficial for your health so keep a glass beside you all day and take sips often. It gives you energy and prevents you from boredom eating.  Do some floor exercises. The last thing you want to do is exercise but it might be just the thing you need the most. Keep it simple and do exercises that involve sitting or laying on the floor. Even the smallest of  exercises release chemicals in the brain that make you feel good. Yoga stretches or core exercises are going to make you feel good with minimal effort.  Make your bed. Making your bed takes a few minutes but it's productive and you'll feel relieved when it's done. An unmade bed is a huge visual reminder that you're having an unproductive day. Do it and consider it your housework for the day.  Put on some nice clothes. Take the sweatpants off even if you don't plan to go anywhere. Put on clothes that make you feel good. Take a look in the mirror so your brain recognizes the sweatpants have been replaced with clothes that make   you look great. It's an instant confidence booster.  Wash the dishes. A pile of dirty dishes in the sink is a reflection of your mood. It's possible that if you wash up the dishes, your mood will follow suit. It's worth a try.  Cook a real meal. If you have the luxury to have a "do nothing" day, you have time to make a real meal for yourself. Make a meal that you love to eat. The process is good to get you out of the funk and the food will ensure you have more energy for tomorrow.  Write out your thoughts by hand. When you hand write, you stimulate your brain to focus on the moment that you're in so make yourself comfortable and write whatever comes into your mind. Put those thoughts out on paper so they stop spinning around in your head. Those thoughts might be the very thing holding you down. LCSW provided education on relaxation techniques such as meditation, deep breathing, massage, grounding exercsies or yoga that can activate the body's relaxation response and ease symptoms of stress and anxiety. LCSW ask that when pt is struggling with difficult emotions and racing thoughts that they start this relaxation response process. LCSW provided extensive education on healthy coping skills for anxiety. SW used active and reflective listening, validated patient's feelings/concerns,  and provided emotional support. Patient Goals/Self-Care Activities: Over the next 120 days - barriers to treatment adherence identified - complementary therapy use encouraged - counseling provided - depression screen reviewed - emotional liability acknowledged and normalized - participation in mental health treatment encouraged - self-awareness of emotional triggers encouraged - strategies to manage emotional triggers promoted - avoid negative self-talk - develop a personal safety plan - develop a plan to deal with triggers like holidays, anniversaries - exercise at least 2 to 3 times per week - have a plan for how to handle bad days - journal feelings and what helps to feel better or worse - spend time or talk with others at least 2 to 3 times per week - spend time or talk with others every day - watch for early signs of feeling worse - write in journal every day - begin personal counseling - call and visit an old friend - check out volunteer opportunities - join a support group - laugh; watch a funny movie or comedian - learn and use visualization or guided imagery - perform a random act of kindness - practice relaxation or meditation daily - start or continue a personal journal - talk about feelings with a friend, family or spiritual advisor - practice positive thinking and self-talk I have placed a referral to [facility] on 01/16/21 . Patient missed their call but will return call and consider their walk in clinic.       Follow up:  Patient agrees to Care Plan and Follow-up.  Plan: The Managed Medicaid care management team will reach out to the patient again over the next 30 days.  Date of next scheduled Social Work care management/care coordination outreach:  03/26/21  Brooke Joyce, BSW, MSW, LCSW Managed Medicaid LCSW [place]  [facility] [email or web address] Phone: [phone]   

## 2021-03-20 ENCOUNTER — Telehealth: Payer: Self-pay | Admitting: Pharmacist

## 2021-03-20 NOTE — Patient Outreach (Signed)
03/20/2021 Name: Derrick Gonzales MRN: [id number] DOB: [birth year]  Referred by: Jerre Simon, MD Reason for referral : No chief complaint on file.  Received message from [name], LCSW with Managed Medicaid team stating patient needed refills of several medications. Attempted to contact patient today to discuss which medications. Patient did not answer and left HIPAA compliant voicemail. Informed patient he may call [facility] clinic back directly with which medications he needs refilled as the request will still have to go to PCP and that I am unable to refill any medications.

## 2021-03-21 ENCOUNTER — Encounter: Payer: Self-pay | Admitting: Student

## 2021-03-21 DIAGNOSIS — N529 Male erectile dysfunction, unspecified: Secondary | ICD-10-CM

## 2021-03-21 MED ORDER — TADALAFIL 10 MG PO TABS: 10.0000 mg | ORAL_TABLET | Freq: Every day | ORAL | 2 refills | Status: DC | PRN

## 2021-03-21 NOTE — Telephone Encounter (Signed)
Spoke with Derrick Gonzales on the phone about his request to increase Tidalafil to 20 tablets a month from 10 tablets. He denies having any chest pain, dizziness or lightheadedness. He voiced understanding he is to only take 1 tablet within 24 hrs window as needed. Placed an order for 20 tablet of 10 mg of Tidalafil with 2 refills.

## 2021-03-26 ENCOUNTER — Other Ambulatory Visit: Payer: Self-pay | Admitting: Licensed Clinical Social Worker

## 2021-03-26 NOTE — Patient Instructions (Signed)
Visit Information  Mr. Tienda was given information about Medicaid Managed Care team care coordination services as a part of their [facility] Community Plan Medicaid benefit. Tawni Pummel Schindel verbally consented to engagement with the [facility] Managed Care team.   If you are experiencing a medical emergency, please call 911 or report to your local emergency department or urgent care.   If you have a non-emergency medical problem during routine business hours, please contact your provider's office and ask to speak with a nurse.   For questions related to your [facility], please call: [phone] or visit the homepage here: [email or web address]  If you would like to schedule transportation through your [facility], please call the following number at least 2 days in advance of your appointment: [phone].   Call the Behavioral Health Crisis Line at [phone], at any time, 24 hours a day, 7 days a week. If you are in danger or need immediate medical attention call 911.  If you would like help to quit smoking, call 1-800-QUIT-NOW (539-378-0768) OR Espaol: 1-855-Djelo-Ya (4-287-681-1572) o para ms informacin haga clic aqu or Text READY to 620-355 to register via text  Mr. Groh - following are the goals we discussed in your visit today:   Goals Addressed             This Visit's Progress    Begin and Stick with Counseling-Depression       Timeframe:  Long-Range Goal Priority:  High Start Date:  11/28/20                          Expected End Date:  ongoing                     Follow Up Date- 04/25/21   - check out counseling - keep 90 percent of counseling appointments - schedule counseling appointment    Why is this important?   Beating depression may take some time.  If you don't feel better right away, don't give up on your treatment plan.    Notes:         [name], BSW, MSW, [employer] Managed Medicaid LCSW [facility]  [facility] [email or web address] Phone: [phone]

## 2021-03-26 NOTE — Patient Outreach (Signed)
Medicaid Managed Care Social Work Note  03/26/2021 Name:  Derrick Gonzales MRN:  [id number] DOB:  1970-09-08  Derrick Gonzales is an 50 y.o. year old male who is a primary patient of Derrick Bleacher, MD.  The Medicaid Managed Care Coordination team was consulted for assistance with:  [place] and Resources  Derrick Gonzales was given information about Medicaid Managed Care Coordination team services today. [name] agreed to services and verbal consent obtained.  Engaged with patient  for by telephone forfollow up visit in response to referral for case management and/or care coordination services.   Assessments/Interventions:  Review of past medical history, allergies, medications, health status, including review of consultants reports, laboratory and other test data, was performed as part of comprehensive evaluation and provision of chronic care management services.  SDOH: (Social Determinant of Health) assessments and interventions performed: SDOH Interventions    Flowsheet Row Most Recent Value  SDOH Interventions   Stress Interventions Provide Counseling, Offered [employer] Resources       Advanced Directives Status:  See Care Plan for related entries.  Care Plan                 Allergies  Allergen Reactions   Atorvastatin Other (See Comments)    Migraine Headache    Medications Reviewed Today     Reviewed by Derrick Bleacher, MD (Resident) on 01/24/21 at [place] List Status: <None>   Medication Order Taking? Sig Documenting Provider Last Dose Status Informant  Baclofen 5 MG TABS [id number]  Take 5 mg by mouth at bedtime as needed. Derrick Covert, MD  Active   Continuous Blood Gluc Receiver (DEXCOM G6 RECEIVER) DEVI [id number] No 1 Device by Does not apply route as directed. Use with Dexcom G6 sensor and transmitter Derrick Gonzales, RPH-CPP Taking Active Self  Continuous Blood Gluc Sensor (DEXCOM G6 SENSOR) MISC [id number] No  INJECT 1 APPLICATOR INTO THE SKIN AS DIRECTED EVERY 10 DAYS Derrick, [name], DO Taking Active   Continuous Blood Gluc Transmit (DEXCOM G6 TRANSMITTER) MISC [id number] No Inject 1 Device into the skin as directed. Reuse 8 times with sensor changes. Derrick, [name], DO Taking Active   diclofenac Sodium (VOLTAREN) 1 % topical gel 4 g [id number]   Derrick Bleacher, MD  Active   empagliflozin (JARDIANCE) 25 MG TABS tablet [id number]  Take 1 tablet (25 mg total) by mouth daily before breakfast. Derrick Covert, MD  Active   glucose blood test strip [id number] No Use as instructed [name], NP Taking Active Self  glucose monitoring kit (FREESTYLE) monitoring kit [id number] No 1 each by Does not apply route as needed. [name], NP Taking Active Self  Insulin Pen Needle (PEN NEEDLES) 32G X 6 MM MISC [id number] No 0.6 mLs by Does not apply route daily. Derrick, [name], DO Taking Active   Lancets (FREESTYLE) lancets [id number] No Use as instructed [name], NP Taking Active Self  LANTUS SOLOSTAR 100 UNIT/ML Solostar Pen [id number] No ADMINISTER 36 UNITS UNDER THE SKIN EVERY MORNING Derrick, [name], DO Taking Active   liraglutide (VICTOZA) 18 MG/3ML SOPN [id number] No Inject 0.6 mg into the skin daily. 0.6 mg once daily for 1 week,then increase to 1.2 mg once daily,max 1.8  mg Derrick, [name], DO Taking Active   lisinopril (ZESTRIL) 2.5 MG tablet [id number]  Take 1 tablet (2.5 mg total) by mouth at bedtime. Derrick Bleacher, MD  Active   metFORMIN (  GLUCOPHAGE-XR) 500 MG 24 hr tablet [id number] No Take 1 tablet (500 mg total) by mouth daily with breakfast. Derrick, [name], DO Taking Active   methocarbamol (ROBAXIN) 500 MG tablet [id number]  Take 2 tablets (1,000 mg total) by mouth every 8 (eight) hours as needed for muscle spasms. Derrick Reichert, MD  Active   naproxen (NAPROSYN) 375 MG tablet [id number]  Take 1 tablet (375 mg total) by mouth 2 (two) times daily with a meal.  Derrick Reichert, MD  Active   rosuvastatin (CRESTOR) 40 MG tablet [id number] No Take 1 tablet (40 mg total) by mouth daily. Derrick, [name], DO Taking Active   sucralfate (CARAFATE) 1 g tablet [id number] No Take 1 tablet (1 g total) by mouth 4 (four) times daily -  with meals and at bedtime.  Patient not taking: No sig reported   Derrick Muskrat, MD Not Taking Active   tadalafil (CIALIS) 10 MG tablet [id number]  Take 1 tablet (10 mg total) by mouth every other day as needed for erectile dysfunction. Derrick Covert, MD  Active             Patient Active Problem List   Diagnosis Date Noted   Chronic right-sided thoracic back pain 11/22/2020   Mood problem 10/16/2020   Tobacco abuse 01/03/2020   Rib pain on right side 01/27/2019   Hypercholesterolemia 08/25/2017   Erectile dysfunction 07/22/2017   DM2 (diabetes mellitus, type 2) ([place]) 09/15/2014   Morbid obesity with BMI of 45.0-49.9, adult ([place]) 12/06/2011    Conditions to be addressed/monitored per PCP order:  Depression  Care Plan : General Social Work (Adult)  Updates made by [name], LCSW since 03/26/2021 12:00 AM     Problem: Depression Identification (Depression)      Long-Range Goal: Depressive Symptoms Identified   Start Date: 11/28/2020  Priority: High  Note:   Timeframe:  Long-Range Goal Priority:  High Start Date:  11/28/20                          Expected End Date:  ongoing                     Follow Up Date 04/25/21   Current barriers:   Chronic Mental Health needs related to depression and anxiety. Patient has been diagnosed with "mood problems" Mental Health Concerns  Needs Support, Education, and Care Coordination in order to meet unmet mental health needs. Clinical Goal(s): patient will work with SW to address concerns related to increasing coping skills to manage mood patient will work with [facility] to address needs related to needing medication management and counseling, referral placed  on 01/16/21  but no follow up has been made since patient missed their call. Patient prefers to go to [place] now because it is closer to his residence.  Clinical Interventions:  Assessed patient's previous and current treatment, coping skills, support system and barriers to care  Review various resources, discussed options and provided patient information about Discussed several options for long term counseling based on need and insurance Depression screen reviewed , Solution-Focused Strategies, Mindfulness or Relaxation Training, Problem [place] , Psychoeducation for mental health needs , Motivational Interviewing, Brief CBT , Verbalization of feelings encouraged , Suicidal Ideation/Homicidal Ideation assessed:, and PHQ2/ PHQ9 completed ; Patient interviewed and appropriate assessments performed Discussed plans with patient for ongoing care management follow up and provided patient with direct contact information for care  management team Assisted patient/caregiver with obtaining information about health plan benefits Provided education and assistance to client regarding Advanced Directives. Discussed several options for long term counseling based on need and insurance.  Patient provided [facility] LCSW permission to placed a referral for [facility]. Patient has been scheduled with both a counselor and psychiatrist. Patient reports that these appointments are both tele-health appointments. His psychiatrist appointment is today at 11:00 am and his counseling appointment is on 12/20/20. Patient reports that his counselor did not show up in the waiting room for their virtual scheduled visit. Patient was provided [facility]'s customer service number and he agreed to contact them to reschedule this appointment. [facility] LCSW sent this care team important update on his [facility] chart. UPDATE 01/16/21- Patient no longer wishes to use [facility] and would prefer to medication management and counseling with someone  closer. Referral made to [facility] on 01/16/21. Patient reports that he has not heard from [facility]. [facility] LCSW will send an urgent message to [facility] staff on 02/06/21 and was informed that practice was unable to reach patient and for him to please contact them back. Update 03/03/21- Patient is agreeable to contact [facility] directly and will consider using their walk in clinic as well. UPDATE 03/26/21- Patient has decided not to follow through with [facility] and has chosen to go to [place]. Patient will contact program himself or go as a walk in patient to initiate services.  Inter-disciplinary care team collaboration (see longitudinal plan of care) LCSW discussed coping skills for anxiety and depression management. SW used empathetic and active and reflective listening, validated patient's feelings/concerns, and provided emotional support. LCSW provided self-care examples to help patient manage his multiple health conditions and improve his mood. 10 LITTLE Things To Do When You're Feeling Too Down To Do Anything  Take a shower. Even if you plan to stay in all day long and not see a soul, take a shower. It takes the most effort to hop in to the shower but once you do, you'll feel immediate results. It will wake you up and you'll be feeling much fresher (and cleaner too).  Brush and floss your teeth. Give your teeth a good brushing with a floss finish. It's a small task but it feels so good and you can check 'taking care of your health' off the list of things to do.  Do something small on your list. Most of [name] have some small thing we would like to get done (load of laundry, sew a button, email a friend). Doing one of these things will make you feel like you've accomplished something.  Drink water. Drinking water is easy right? It's also really beneficial for your health so keep a glass beside you all day and take sips often. It gives you energy and prevents you from boredom eating.  Do some floor  exercises. The last thing you want to do is exercise but it might be just the thing you need the most. Keep it simple and do exercises that involve sitting or laying on the floor. Even the smallest of exercises release chemicals in the brain that make you feel good. Yoga stretches or core exercises are going to make you feel good with minimal effort.  Make your bed. Making your bed takes a few minutes but it's productive and you'll feel relieved when it's done. An unmade bed is a huge visual reminder that you're having an unproductive day. Do it and consider it your housework for the day.  Put  on some nice clothes. Take the sweatpants off even if you don't plan to go anywhere. Put on clothes that make you feel good. Take a look in the mirror so your brain recognizes the sweatpants have been replaced with clothes that make you look great. It's an instant confidence booster.  Wash the dishes. A pile of dirty dishes in the sink is a reflection of your mood. It's possible that if you wash up the dishes, your mood will follow suit. It's worth a try.  Cook a real meal. If you have the luxury to have a "do nothing" day, you have time to make a real meal for yourself. Make a meal that you love to eat. The process is good to get you out of the funk and the food will ensure you have more energy for tomorrow.  Write out your thoughts by hand. When you hand write, you stimulate your brain to focus on the moment that you're in so make yourself comfortable and write whatever comes into your mind. Put those thoughts out on paper so they stop spinning around in your head. Those thoughts might be the very thing holding you down. LCSW provided education on relaxation techniques such as meditation, deep breathing, massage, grounding exercsies or yoga that can activate the body's relaxation response and ease symptoms of stress and anxiety. LCSW ask that when pt is struggling with difficult emotions and racing thoughts  that they start this relaxation response process. LCSW provided extensive education on healthy coping skills for anxiety. SW used active and reflective listening, validated patient's feelings/concerns, and provided emotional support. Patient Goals/Self-Care Activities: Over the next 120 days - barriers to treatment adherence identified - complementary therapy use encouraged - counseling provided - depression screen reviewed - emotional liability acknowledged and normalized - participation in mental health treatment encouraged - self-awareness of emotional triggers encouraged - strategies to manage emotional triggers promoted - avoid negative self-talk - develop a personal safety plan - develop a plan to deal with triggers like holidays, anniversaries - exercise at least 2 to 3 times per week - have a plan for how to handle bad days - journal feelings and what helps to feel better or worse - spend time or talk with others at least 2 to 3 times per week - spend time or talk with others every day - watch for early signs of feeling worse - write in journal every day - begin personal counseling - call and visit an old friend - check out volunteer opportunities - join a support group - laugh; watch a funny movie or comedian - learn and use visualization or guided imagery - perform a random act of kindness - practice relaxation or meditation daily - start or continue a personal journal - talk about feelings with a friend, family or spiritual advisor - practice positive thinking and self-talk I have placed a referral to [facility] on 01/16/21 . Patient missed their call but will return call and consider their walk in clinic. Update- Patient wishes to gain mental health services at [facility] instead of [facility] or [facility].       Follow up:  Patient agrees to Care Plan and Follow-up.  Plan: The Managed Medicaid care management team will reach out to the patient again over the next 30  days.  Date/time of next scheduled Social Work care management/care coordination outreach:  04/25/21  Eula Fried, BSW, MSW, LCSW Managed Medicaid LCSW [email or web address] Phone: [phone]

## 2021-04-25 ENCOUNTER — Ambulatory Visit: Payer: Self-pay

## 2021-06-09 DIAGNOSIS — M25572 Pain in left ankle and joints of left foot: Secondary | ICD-10-CM | POA: Diagnosis not present

## 2021-06-09 DIAGNOSIS — G8911 Acute pain due to trauma: Secondary | ICD-10-CM | POA: Diagnosis not present

## 2021-06-10 DIAGNOSIS — M25572 Pain in left ankle and joints of left foot: Secondary | ICD-10-CM | POA: Diagnosis not present

## 2021-06-19 ENCOUNTER — Encounter: Payer: Self-pay | Admitting: Physical Therapy

## 2021-06-19 NOTE — Therapy (Signed)
[place]. [place], [place], [address] Phone: [phone]   Fax:  [phone]  Patient Details  Name: Derrick Gonzales MRN: [id number] Date of Birth: March 20, 1971 Referring Provider:  No ref. provider found  Encounter Date: 06/19/2021  PHYSICAL THERAPY DISCHARGE SUMMARY  Visits from Start of Care: 2  Current functional level related to goals / functional outcomes: Did not return since last visit July 2022; FOTO DC complete 06/19/21   Remaining deficits: Unable to assess    Education / Equipment: Unable to assess    Patient agrees to discharge. Patient goals were not met. Patient is being discharged due to not returning since the last visit.  Ann Lions PT, DPT, PN2   Supplemental Physical Therapist [place]. [place], [place], [address] Phone: [phone]   Fax:  [phone]

## 2021-06-20 ENCOUNTER — Other Ambulatory Visit: Payer: Self-pay | Admitting: Family Medicine

## 2021-06-20 DIAGNOSIS — E1165 Type 2 diabetes mellitus with hyperglycemia: Secondary | ICD-10-CM

## 2021-06-21 ENCOUNTER — Other Ambulatory Visit: Payer: Self-pay

## 2021-06-21 DIAGNOSIS — E7800 Pure hypercholesterolemia, unspecified: Secondary | ICD-10-CM

## 2021-06-24 ENCOUNTER — Other Ambulatory Visit: Payer: Self-pay

## 2021-06-24 DIAGNOSIS — E1165 Type 2 diabetes mellitus with hyperglycemia: Secondary | ICD-10-CM

## 2021-06-24 MED ORDER — LANTUS SOLOSTAR 100 UNIT/ML ~~LOC~~ SOPN: PEN_INJECTOR | SUBCUTANEOUS | 1 refills | Status: DC

## 2021-06-24 MED ORDER — ROSUVASTATIN CALCIUM 40 MG PO TABS: 40.0000 mg | ORAL_TABLET | Freq: Every day | ORAL | 3 refills | Status: DC

## 2021-06-24 MED ORDER — PEN NEEDLES 32G X 6 MM MISC: 0.6000 mL | Freq: Every day | 1 refills | Status: DC

## 2021-06-24 MED ORDER — DEXCOM G6 SENSOR MISC: 11 refills | Status: DC

## 2021-06-24 MED ORDER — DEXCOM G6 TRANSMITTER MISC: 1.0000 | 3 refills | Status: DC

## 2021-07-01 ENCOUNTER — Telehealth: Payer: Self-pay | Admitting: Family Medicine

## 2021-07-01 NOTE — Telephone Encounter (Signed)
After hour call  Woke up a few hours ago around noon for his shift and endorsing vision changes. Started to have blurry vision that is ongoing for a few hours. History of type 2 DM, endorses compliance on his insulin regimen. Feels like he is also stumbling, "its like a bad case of being drunk." Checked your blood glucose levels which in 400s. Sugars are typically around 180-200s. Endorsing weakness. Recommended to get further evaluation at ED given acute changes.

## 2021-07-02 ENCOUNTER — Encounter (HOSPITAL_COMMUNITY): Payer: Self-pay | Admitting: Emergency Medicine

## 2021-07-02 ENCOUNTER — Emergency Department (HOSPITAL_COMMUNITY)
Admission: EM | Admit: 2021-07-02 | Discharge: 2021-07-02 | Disposition: A | Discharge status: Home / Self Care | Payer: Medicaid Other | Attending: Emergency Medicine | Admitting: Emergency Medicine

## 2021-07-02 ENCOUNTER — Other Ambulatory Visit: Payer: Self-pay

## 2021-07-02 ENCOUNTER — Emergency Department (HOSPITAL_COMMUNITY): Payer: Medicaid Other

## 2021-07-02 DIAGNOSIS — Z7984 Long term (current) use of oral hypoglycemic drugs: Secondary | ICD-10-CM | POA: Insufficient documentation

## 2021-07-02 DIAGNOSIS — E1165 Type 2 diabetes mellitus with hyperglycemia: Secondary | ICD-10-CM | POA: Insufficient documentation

## 2021-07-02 DIAGNOSIS — R739 Hyperglycemia, unspecified: Secondary | ICD-10-CM

## 2021-07-02 DIAGNOSIS — M545 Low back pain, unspecified: Secondary | ICD-10-CM | POA: Insufficient documentation

## 2021-07-02 DIAGNOSIS — Z20822 Contact with and (suspected) exposure to covid-19: Secondary | ICD-10-CM | POA: Diagnosis not present

## 2021-07-02 DIAGNOSIS — Z794 Long term (current) use of insulin: Secondary | ICD-10-CM | POA: Insufficient documentation

## 2021-07-02 DIAGNOSIS — H538 Other visual disturbances: Secondary | ICD-10-CM | POA: Diagnosis present

## 2021-07-02 LAB — CBC WITH DIFFERENTIAL/PLATELET
Abs Immature Granulocytes: 0.02 10*3/uL (ref 0.00–0.07)
Basophils Absolute: 0.1 10*3/uL (ref 0.0–0.1)
Basophils Relative: 1 %
Eosinophils Absolute: 0.4 10*3/uL (ref 0.0–0.5)
Eosinophils Relative: 4 %
HCT: 44.9 % (ref 39.0–52.0)
Hemoglobin: 15.4 g/dL (ref 13.0–17.0)
Immature Granulocytes: 0 %
Lymphocytes Relative: 30 %
Lymphs Abs: 2.7 10*3/uL (ref 0.7–4.0)
MCH: 30.2 pg (ref 26.0–34.0)
MCHC: 34.3 g/dL (ref 30.0–36.0)
MCV: 88 fL (ref 80.0–100.0)
Monocytes Absolute: 0.7 10*3/uL (ref 0.1–1.0)
Monocytes Relative: 8 %
Neutro Abs: 5.2 10*3/uL (ref 1.7–7.7)
Neutrophils Relative %: 57 %
Platelets: 216 10*3/uL (ref 150–400)
RBC: 5.1 MIL/uL (ref 4.22–5.81)
RDW: 12.1 % (ref 11.5–15.5)
WBC: 9 10*3/uL (ref 4.0–10.5)
nRBC: 0 % (ref 0.0–0.2)

## 2021-07-02 LAB — BASIC METABOLIC PANEL
Anion gap: 9 (ref 5–15)
BUN: 14 mg/dL (ref 6–20)
CO2: 26 mmol/L (ref 22–32)
Calcium: 8.8 mg/dL — ABNORMAL LOW (ref 8.9–10.3)
Chloride: 100 mmol/L (ref 98–111)
Creatinine, Ser: 0.63 mg/dL (ref 0.61–1.24)
GFR, Estimated: >60 mL/min (ref >60)
Glucose, Bld: 369 mg/dL — ABNORMAL HIGH (ref 70–99)
Potassium: 3.8 mmol/L (ref 3.5–5.1)
Sodium: 135 mmol/L (ref 135–145)

## 2021-07-02 LAB — CBG MONITORING, ED
Glucose-Capillary: 357 mg/dL — ABNORMAL HIGH (ref 70–99)
Glucose-Capillary: 270 mg/dL — ABNORMAL HIGH (ref 70–99)
Glucose-Capillary: 230 mg/dL — ABNORMAL HIGH (ref 70–99)

## 2021-07-02 LAB — URINALYSIS, ROUTINE W REFLEX MICROSCOPIC
Bacteria, UA: NONE SEEN
Bilirubin Urine: NEGATIVE
Glucose, UA: >=500 mg/dL — AB
Hgb urine dipstick: NEGATIVE
Ketones, ur: NEGATIVE mg/dL
Leukocytes,Ua: NEGATIVE
Nitrite: NEGATIVE
Protein, ur: NEGATIVE mg/dL
Specific Gravity, Urine: 1.033 — ABNORMAL HIGH (ref 1.005–1.030)
pH: 5 (ref 5.0–8.0)

## 2021-07-02 LAB — RESP PANEL BY RT-PCR (FLU A&B, COVID) ARPGX2
Influenza A by PCR: NEGATIVE
Influenza B by PCR: NEGATIVE
SARS Coronavirus 2 by RT PCR: NEGATIVE

## 2021-07-02 MED ORDER — LACTATED RINGERS IV BOLUS
1000.0000 mL | Freq: Once | INTRAVENOUS | Status: AC
Start: 2021-07-02 — End: 2021-07-02
  Administered 2021-07-02: 1000 mL via INTRAVENOUS

## 2021-07-02 MED ORDER — INSULIN ASPART 100 UNIT/ML IJ SOLN
5.0000 [IU] | Freq: Once | INTRAMUSCULAR | Status: AC
  Administered 2021-07-02: 5 [IU] via INTRAVENOUS

## 2021-07-02 NOTE — ED Provider Triage Note (Signed)
Emergency Medicine Provider Triage Evaluation Note  Derrick Gonzales , a 51 y.o. male  was evaluated in triage.  Pt complains of blurry vision, balance issues since last night, reports that his blood sugar has been elevated around 400-500.  Patient reports that he called his PCP was concerned that he may be having a stroke, was told to come here last night, but could not get here, called EMS waited at [facility] for 10+ hours then left and came here.  Patient reports that he is still having blurry vision like cotton around everything that he sees, no history of same.  He denies any chest pain, shortness of breath, nausea, vomiting.  He does endorse some right lower back pain, concerned that it is kidney pain.  Review of Systems  Positive: As above Negative: As above  Physical Exam  BP 140/78 (BP Location: Left Arm)    Pulse 93    Temp 99 F (37.2 C) (Oral)    Resp 18    SpO2 96%  Gen:   Awake, no distress   Resp:  Normal effort  MSK:   Moves extremities without difficulty  Other:  Patient stepped out during romberg but ambulated without difficulty. Moves all limbs spontaneously. CN2-12 grossly intact.  Medical Decision Making  Medically screening exam initiated at 3:05 PM.  Appropriate orders placed.  Tawni Pummel Vonderhaar was informed that the remainder of the evaluation will be completed by another provider, this initial triage assessment does not replace that evaluation, and the importance of remaining in the ED until their evaluation is complete.  Workup initiated   Olene Floss, [place] 07/02/21 1508

## 2021-07-02 NOTE — ED Provider Notes (Signed)
[facility] EMERGENCY DEPARTMENT Provider Note   CSN: [id number] Arrival date & time: 07/02/21  1422     History  Chief Complaint  Patient presents with   Blurred Vision    Derrick Gonzales is a 51 y.o. male with type 2 diabetes on Lantus, Jardiance, metformin who presents to the ED for evaluation of bilateral vision changes that started yesterday upon waking.  Patient describes symptoms vision is blurry with fuzzy halos.  He also states that when he stands up, he feels off balance and needs to catch himself although he denies dizziness or lightheadedness.  No known trauma.  Patient drives at night for living and notes that driving has been difficult for him.  He denies eye pain, headache.  Patient called his primary care was concerned that he may be having a stroke and referred him to the ED.  He spent several hours at [facility] before coming here.  No previous history.  He is compliant on all his medications but notes that his sugars have been elevated despite that.  He also endorses some right lower back pain and is concerned that it is his kidneys this was relieved upon urinating.  HPI     Home Medications Prior to Admission medications   Medication Sig Start Date End Date Taking? Authorizing Provider  Baclofen 5 MG TABS Take 5 mg by mouth at bedtime as needed. 12/05/20   Lind Covert, MD  Continuous Blood Gluc Receiver (DEXCOM G6 RECEIVER) DEVI 1 Device by Does not apply route as directed. Use with Dexcom G6 sensor and transmitter 09/29/19   Leavy [name], RPH-CPP  Continuous Blood Gluc Sensor (DEXCOM G6 SENSOR) MISC INJECT 1 APPLICATOR INTO THE SKIN AS DIRECTED EVERY 10 DAYS 06/24/21   [name], MD  Continuous Blood Gluc Transmit (DEXCOM G6 TRANSMITTER) MISC Inject 1 Device into the skin as directed. Reuse 8 times with sensor changes. 06/24/21   [name], MD  empagliflozin (JARDIANCE) 25 MG TABS tablet Take 1 tablet (25 mg total) by mouth  daily before breakfast. 12/05/20   Lind Covert, MD  glucose blood test strip Use as instructed 06/22/14   Chari Manning A, NP  glucose monitoring kit (FREESTYLE) monitoring kit 1 each by Does not apply route as needed. 06/22/14   [name], NP  insulin glargine (LANTUS SOLOSTAR) 100 UNIT/ML Solostar Pen ADMINISTER 36 UNITS UNDER THE SKIN EVERY MORNING 06/24/21   [name], MD  Insulin Pen Needle (PEN NEEDLES) 32G X 6 MM MISC 0.6 mLs by Does not apply route daily. 06/24/21   [name], MD  Lancets (FREESTYLE) lancets Use as instructed 06/22/14   [name], NP  lisinopril (ZESTRIL) 2.5 MG tablet Take 1 tablet (2.5 mg total) by mouth at bedtime. 12/17/20   [name], MD  metFORMIN (GLUCOPHAGE-XR) 500 MG 24 hr tablet Take 1 tablet (500 mg total) by mouth daily with breakfast. 05/24/20   [name], [name], DO  methocarbamol (ROBAXIN) 500 MG tablet Take 2 tablets (1,000 mg total) by mouth every 8 (eight) hours as needed for muscle spasms. 01/10/21   [name], MD  naproxen (NAPROSYN) 375 MG tablet Take 1 tablet (375 mg total) by mouth 2 (two) times daily with a meal. 01/10/21   [name], MD  rosuvastatin (CRESTOR) 40 MG tablet Take 1 tablet (40 mg total) by mouth daily. 06/24/21   [name], MD  sucralfate (CARAFATE) 1 g tablet Take 1 tablet (1 g  total) by mouth 4 (four) times daily -  with meals and at bedtime. Patient not taking: No sig reported 10/11/19   Carmin Muskrat, MD  tadalafil (CIALIS) 10 MG tablet Take 1 tablet (10 mg total) by mouth daily as needed for up to 60 doses for erectile dysfunction. 03/21/21   [name], MD  VICTOZA 18 MG/3ML SOPN ADMINISTER 0.6 MG UNDER THE SKIN EVERY DAY FOR 1 WEEK. INCREASE TO 1.2 MG EVERY DAY. MAX 1.8 MG 06/21/21   [name], MD      Allergies    Atorvastatin    Review of Systems   Review of Systems  Constitutional:  Negative for fever.  HENT: Negative.    Eyes:  Positive for visual disturbance.  Respiratory:   Negative for shortness of breath.   Cardiovascular: Negative.   Gastrointestinal:  Negative for abdominal pain and vomiting.  Endocrine: Negative.   Genitourinary: Negative.   Musculoskeletal: Negative.   Skin:  Negative for rash.  Neurological:  Negative for headaches.  All other systems reviewed and are negative.  Physical Exam Updated Vital Signs BP (!) 149/84    Pulse 80    Temp 98 F (36.7 C) (Oral)    Resp 12    SpO2 97%  Physical Exam Vitals and nursing note reviewed.  Constitutional:      General: He is not in acute distress.    Appearance: He is not ill-appearing.  HENT:     Head: Atraumatic.  Eyes:     General: No scleral icterus.       Right eye: No discharge.        Left eye: No discharge.     Extraocular Movements: Extraocular movements intact.     Conjunctiva/sclera: Conjunctivae normal.     Pupils: Pupils are equal, round, and reactive to light.  Cardiovascular:     Rate and Rhythm: Normal rate and regular rhythm.     Pulses: Normal pulses.     Heart sounds: No murmur heard. Pulmonary:     Effort: Pulmonary effort is normal. No respiratory distress.     Breath sounds: Normal breath sounds.  Abdominal:     General: Abdomen is flat. There is no distension.     Palpations: Abdomen is soft.     Tenderness: There is no abdominal tenderness.  Musculoskeletal:        General: Normal range of motion.     Cervical back: Normal range of motion.  Skin:    General: Skin is warm and dry.     Capillary Refill: Capillary refill takes less than 2 seconds.  Neurological:     General: No focal deficit present.     Mental Status: He is alert.     Comments: Speech is clear, able to follow commands CN III-XII intact Normal strength in upper and lower extremities bilaterally including dorsiflexion and plantar flexion, strong and equal grip strength Sensation normal to light and sharp touch Moves extremities without ataxia, coordination intact Normal finger to nose and  rapid alternating movements No pronator drift    Psychiatric:        Mood and Affect: Mood normal.    ED Results / Procedures / Treatments   Labs (all labs ordered are listed, but only abnormal results are displayed) Labs Reviewed  URINALYSIS, ROUTINE W REFLEX MICROSCOPIC - Abnormal; Notable for the following components:      Result Value   Specific Gravity, Urine 1.033 (*)    Glucose, UA >=500 (*)  All other components within normal limits  BASIC METABOLIC PANEL - Abnormal; Notable for the following components:   Glucose, Bld 369 (*)    Calcium 8.8 (*)    All other components within normal limits  CBG MONITORING, ED - Abnormal; Notable for the following components:   Glucose-Capillary 357 (*)    All other components within normal limits  CBG MONITORING, ED - Abnormal; Notable for the following components:   Glucose-Capillary 270 (*)    All other components within normal limits  CBG MONITORING, ED - Abnormal; Notable for the following components:   Glucose-Capillary 230 (*)    All other components within normal limits  RESP PANEL BY RT-PCR (FLU A&B, COVID) ARPGX2  CBC WITH DIFFERENTIAL/PLATELET    EKG EKG Interpretation  Date/Time:  Tuesday July 02 2021 14:54:04 EST Ventricular Rate:  100 PR Interval:  134 QRS Duration: 128 QT Interval:  370 QTC Calculation: 477 R Axis:   64 Text Interpretation: Normal sinus rhythm Right bundle branch block Abnormal ECG When compared with ECG of 02-Aug-2015 10:09, PREVIOUS ECG IS PRESENT RBBB is new Confirmed by Davonna Belling [phone]) on 07/02/2021 6:55:25 PM  Radiology CT Head Wo Contrast  Result Date: 07/02/2021 CLINICAL DATA:  Provided history: Neuro deficit, acute, stroke suspected. EXAM: CT HEAD WITHOUT CONTRAST TECHNIQUE: Contiguous axial images were obtained from the base of the skull through the vertex without intravenous contrast. RADIATION DOSE REDUCTION: This exam was performed according to the departmental  dose-optimization program which includes automated exposure control, adjustment of the mA and/or kV according to patient size and/or use of iterative reconstruction technique. COMPARISON:  Prior head CT examinations 11/17/2009 and earlier. FINDINGS: Brain: Cerebral atrophy, mild but advanced for age. There is no acute intracranial hemorrhage. No demarcated cortical infarct. No extra-axial fluid collection. No evidence of an intracranial mass. No midline shift. Vascular: No hyperdense vessel.  Atherosclerotic calcifications. Skull: Normal. Negative for fracture or focal lesion. Sinuses/Orbits: Visualized orbits show no acute finding. Trace mucosal thickening within the left maxillary sinus. IMPRESSION: No evidence of acute intracranial abnormality. Generalized cerebral atrophy, mild but advanced for age. Trace mucosal thickening within the left maxillary sinus. Electronically Signed   By: Kellie Simmering D.O.   On: 07/02/2021 16:08    Procedures Procedures    Medications Ordered in ED Medications  lactated ringers bolus 1,000 mL (0 mLs Intravenous Stopped 07/02/21 1910)  insulin aspart (novoLOG) injection 5 Units (5 Units Intravenous Given 07/02/21 1723)  lactated ringers bolus 1,000 mL (0 mLs Intravenous Stopped 07/02/21 2011)    ED Course/ Medical Decision Making/ A&P                           Medical Decision Making Risk Prescription drug management.   History:  Per HPI  Initial impression:  This patient presents to the ED for concern of blurred vision and intermittent trouble balancing, this involves an extensive number of treatment options, and is a complaint that carries with it a high risk of complications and morbidity.   Ddx includes but not limited to cerebellar stroke, hyperglycemia  ED Course: Patient is in no acute distress, nontoxic-appearing.  His physical exam was largely unremarkable.  Neuro exam was normal.  Bilateral eyes without discharge or erythema.  EOM was intact.  At  time of evaluation, CT head had returned and was negative for acute infarct.  CBC without leukocytosis.  BMP with blood sugar at 370.  UA without hematuria or  signs of infection.  All labs and imaging were independently ordered, reviewed and interpreted by myself.  I agree with the radiologist interpretation.  Given that his CT was negative for infarct and patient has elevated blood glucose and admits that this has been difficult to keep under control recently, I believe that his hyperglycemia may be causing his blurred vision.  He was given 5 units insulin with 2 L LR bolus with CBG ultimately reduced down to 230.  Patient does remain blurry.   Cardiac Monitoring:  The patient was maintained on a cardiac monitor.  I personally viewed and interpreted the cardiac monitored which showed an underlying rhythm of: NSR   Medicines ordered and prescription drug management:  I ordered medication including: Insulin 5 units for hyperglycemia 2 L lactated ringer bolus for hyperglycemia Reevaluation of the patient after these medicines showed that the patient improved I have reviewed the patients home medicines and have made adjustments as needed   Disposition:  After consideration of the diagnostic results, physical exam, history and the patients response to treatment feel that the patent would benefit from discharge with.   Blurred vision Hyperglycemia: Patient is likely experiencing blurred vision secondary to hyperglycemia.  We did get his glucose under 250 while he was here in the emergency department.  I discussed in depth with patient the need to call his primary care doctor tomorrow and his ophthalmologist to discuss his medications and prevent further progression into diabetic retinopathy.  Patient is understanding and amenable to plan.  Return precautions were discussed.  All questions asked and answered and he was discharged home in good condition.  Final Clinical Impression(s) / ED  Diagnoses Final diagnoses:  Hyperglycemia  Blurred vision, bilateral    Rx / DC Orders ED Discharge Orders     None         Rodena Piety 07/02/21 2110    Davonna Belling, MD 07/02/21 2349

## 2021-07-02 NOTE — Discharge Instructions (Signed)
Your work-up today was overall reassuring.  Your CT was negative for stroke or any intracranial cause of your blurred vision.  Your blood sugar was pretty elevated at around 400, and this is a common cause of blurred vision.  I strongly recommend that you follow-up with your eye doctor tomorrow so that they can evaluate this.  Fortunately we did get your blood sugar down to about 200 before you left today.  Keep monitoring this at home and take your medications as directed to keep your blood sugar at appropriate levels.  Since you have been taking your medications as directed and your blood sugar is still quite elevated, please follow-up with your primary care doctor to determine if any changes in medication are needed.

## 2021-07-02 NOTE — ED Triage Notes (Signed)
Pt. Stated, Derrick Gonzales had blurred vision since yesterday morning. Im off balance. My sugar has been up to 435. I talked to on call last night and told to come here. EMS came to get me and they took me to [facility] and I sit there til 0500 and I decided to leave.

## 2021-07-11 ENCOUNTER — Ambulatory Visit: Payer: Medicaid Other | Admitting: Student

## 2021-07-29 ENCOUNTER — Other Ambulatory Visit: Payer: Self-pay | Admitting: *Deleted

## 2021-07-29 DIAGNOSIS — E118 Type 2 diabetes mellitus with unspecified complications: Secondary | ICD-10-CM

## 2021-07-29 MED ORDER — METFORMIN HCL ER 500 MG PO TB24: 500.0000 mg | ORAL_TABLET | Freq: Every day | ORAL | 3 refills | Status: DC

## 2021-08-13 ENCOUNTER — Other Ambulatory Visit: Payer: Self-pay | Admitting: *Deleted

## 2021-08-13 MED ORDER — LANTUS SOLOSTAR 100 UNIT/ML ~~LOC~~ SOPN
PEN_INJECTOR | SUBCUTANEOUS | 1 refills | Status: DC
Start: 2021-08-13 — End: 2022-05-05

## 2021-08-22 DIAGNOSIS — H1089 Other conjunctivitis: Secondary | ICD-10-CM | POA: Diagnosis not present

## 2021-08-22 DIAGNOSIS — H5712 Ocular pain, left eye: Secondary | ICD-10-CM | POA: Diagnosis not present

## 2021-09-02 ENCOUNTER — Encounter: Payer: Self-pay | Admitting: Student

## 2021-09-02 DIAGNOSIS — N529 Male erectile dysfunction, unspecified: Secondary | ICD-10-CM

## 2021-09-02 MED ORDER — TADALAFIL 10 MG PO TABS: 10.0000 mg | ORAL_TABLET | Freq: Every day | ORAL | 2 refills | Status: DC | PRN

## 2021-10-02 ENCOUNTER — Emergency Department (HOSPITAL_COMMUNITY)
Admission: EM | Admit: 2021-10-02 | Discharge: 2021-10-02 | Disposition: A | Discharge status: Home / Self Care | Payer: Medicaid Other | Attending: Emergency Medicine | Admitting: Emergency Medicine

## 2021-10-02 ENCOUNTER — Emergency Department (HOSPITAL_COMMUNITY): Payer: Medicaid Other

## 2021-10-02 DIAGNOSIS — R072 Precordial pain: Secondary | ICD-10-CM | POA: Diagnosis present

## 2021-10-02 DIAGNOSIS — R739 Hyperglycemia, unspecified: Secondary | ICD-10-CM

## 2021-10-02 DIAGNOSIS — Z794 Long term (current) use of insulin: Secondary | ICD-10-CM | POA: Diagnosis not present

## 2021-10-02 DIAGNOSIS — E119 Type 2 diabetes mellitus without complications: Secondary | ICD-10-CM

## 2021-10-02 DIAGNOSIS — Z7984 Long term (current) use of oral hypoglycemic drugs: Secondary | ICD-10-CM | POA: Insufficient documentation

## 2021-10-02 DIAGNOSIS — E876 Hypokalemia: Secondary | ICD-10-CM | POA: Diagnosis not present

## 2021-10-02 DIAGNOSIS — R0602 Shortness of breath: Secondary | ICD-10-CM | POA: Insufficient documentation

## 2021-10-02 DIAGNOSIS — E1165 Type 2 diabetes mellitus with hyperglycemia: Secondary | ICD-10-CM | POA: Diagnosis not present

## 2021-10-02 LAB — CBC
HCT: 45 % (ref 39.0–52.0)
Hemoglobin: 15.1 g/dL (ref 13.0–17.0)
MCH: 30.1 pg (ref 26.0–34.0)
MCHC: 33.6 g/dL (ref 30.0–36.0)
MCV: 89.8 fL (ref 80.0–100.0)
Platelets: 210 10*3/uL (ref 150–400)
RBC: 5.01 MIL/uL (ref 4.22–5.81)
RDW: 12 % (ref 11.5–15.5)
WBC: 8.1 10*3/uL (ref 4.0–10.5)
nRBC: 0 % (ref 0.0–0.2)

## 2021-10-02 LAB — BASIC METABOLIC PANEL
Anion gap: 7 (ref 5–15)
BUN: 14 mg/dL (ref 6–20)
CO2: 21 mmol/L — ABNORMAL LOW (ref 22–32)
Calcium: 8.8 mg/dL — ABNORMAL LOW (ref 8.9–10.3)
Chloride: 107 mmol/L (ref 98–111)
Creatinine, Ser: 0.74 mg/dL (ref 0.61–1.24)
GFR, Estimated: >60 mL/min (ref >60)
Glucose, Bld: 328 mg/dL — ABNORMAL HIGH (ref 70–99)
Potassium: 3.3 mmol/L — ABNORMAL LOW (ref 3.5–5.1)
Sodium: 135 mmol/L (ref 135–145)

## 2021-10-02 LAB — TROPONIN I (HIGH SENSITIVITY)
Troponin I (High Sensitivity): 8 ng/L (ref <18)
Troponin I (High Sensitivity): 7 ng/L (ref <18)

## 2021-10-02 LAB — CBG MONITORING, ED: Glucose-Capillary: 261 mg/dL — ABNORMAL HIGH (ref 70–99)

## 2021-10-02 MED ORDER — POTASSIUM CHLORIDE CRYS ER 20 MEQ PO TBCR
40.0000 meq | EXTENDED_RELEASE_TABLET | Freq: Once | ORAL | Status: AC
  Administered 2021-10-02: 40 meq via ORAL
  Filled 2021-10-02: qty 2

## 2021-10-02 MED ORDER — ACETAMINOPHEN 500 MG PO TABS
1000.0000 mg | ORAL_TABLET | Freq: Once | ORAL | Status: AC
Start: 2021-10-02 — End: 2021-10-02
  Administered 2021-10-02: 1000 mg via ORAL
  Filled 2021-10-02: qty 2

## 2021-10-02 MED ORDER — INSULIN ASPART 100 UNIT/ML IJ SOLN
6.0000 [IU] | Freq: Once | INTRAMUSCULAR | Status: AC
  Administered 2021-10-02: 6 [IU] via SUBCUTANEOUS

## 2021-10-02 MED ORDER — FAMOTIDINE 20 MG PO TABS
20.0000 mg | ORAL_TABLET | Freq: Once | ORAL | Status: AC
  Administered 2021-10-02: 20 mg via ORAL
  Filled 2021-10-02: qty 1
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
## 2021-10-02 NOTE — Discharge Instructions (Addendum)
It was our pleasure to provide your ER care today - we hope that you feel better. ? ?For chest discomfort, follow up with cardiologist in one week - call office to arrange appointment.  ? ?Your blood sugar is high - continue diabetes meds, monitor sugars, follow diabetes meal plan, and follow up closely with primary care doctor in the next 1-2 days to ensure med refill and establish follow up appointment.  ? ?Your potassium level is mildly low - eat plenty of fruits and vegetables, and follow up with primary care doctor in one week.  ? ?Return to ER if worse, new symptoms, recurrent or persistent chest pain, increased trouble breathing, or other concern.  ?
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
## 2021-10-02 NOTE — ED Notes (Signed)
Pt given fluids. Pt tolerated PO.  ?

## 2021-10-22 ENCOUNTER — Encounter: Payer: Self-pay | Admitting: *Deleted

## 2021-11-06 ENCOUNTER — Ambulatory Visit: Payer: Medicaid Other | Admitting: Student

## 2021-11-26 ENCOUNTER — Ambulatory Visit: Payer: Medicaid Other | Admitting: Student

## 2022-01-06 ENCOUNTER — Ambulatory Visit: Payer: Medicaid Other | Admitting: Student

## 2022-01-14 NOTE — Progress Notes (Unsigned)
    SUBJECTIVE:   CHIEF COMPLAINT / HPI:   Diabetes Patient's current diabetic medications include*** Tolerating well without side effects***.  Patient endorses compliance with these medications***. CBG readings averaging in the *** range.  Patient's last A1c was *** Denies abdominal pain, blurred vision, polyuria, polydipsia, hypoglycemia***. Patient states they understand that diet and exercise can help with her diabetes***.  Annual foot and eye exam is ***   PERTINENT  PMH / PSH: ***  OBJECTIVE:   There were no vitals taken for this visit.  ***  Physical Exam General: Alert, well appearing, NAD, Oriented x4 Cardiovascular: RRR, No Murmurs, Normal S2/S2 Respiratory: CTAB, No wheezing or Rales Abdomen: No distension or tenderness Extremities: No edema on extremities   Skin: Warm and dry  ASSESSMENT/PLAN:   No problem-specific Assessment & Plan notes found for this encounter.     Jerre Simon, MD [facility]   {    This will disappear when note is signed, click to select method of visit    :1}

## 2022-01-15 ENCOUNTER — Encounter: Payer: Self-pay | Admitting: Student

## 2022-01-15 ENCOUNTER — Ambulatory Visit (INDEPENDENT_AMBULATORY_CARE_PROVIDER_SITE_OTHER): Payer: Medicaid Other | Admitting: Student

## 2022-01-15 VITALS — BP 147/80 | HR 81 | Ht 65.0 in | Wt 269.0 lb

## 2022-01-15 DIAGNOSIS — G8929 Other chronic pain: Secondary | ICD-10-CM

## 2022-01-15 DIAGNOSIS — R12 Heartburn: Secondary | ICD-10-CM | POA: Diagnosis not present

## 2022-01-15 DIAGNOSIS — E1165 Type 2 diabetes mellitus with hyperglycemia: Secondary | ICD-10-CM | POA: Diagnosis not present

## 2022-01-15 DIAGNOSIS — M546 Pain in thoracic spine: Secondary | ICD-10-CM | POA: Diagnosis not present

## 2022-01-15 DIAGNOSIS — B3749 Other urogenital candidiasis: Secondary | ICD-10-CM | POA: Diagnosis not present

## 2022-01-15 DIAGNOSIS — N529 Male erectile dysfunction, unspecified: Secondary | ICD-10-CM | POA: Diagnosis not present

## 2022-01-15 DIAGNOSIS — E7800 Pure hypercholesterolemia, unspecified: Secondary | ICD-10-CM | POA: Diagnosis not present

## 2022-01-15 LAB — POCT GLYCOSYLATED HEMOGLOBIN (HGB A1C): HbA1c, POC (controlled diabetic range): 12.7 % — AB (ref 0.0–7.0)

## 2022-01-15 MED ORDER — BACLOFEN 5 MG PO TABS: 5.0000 mg | ORAL_TABLET | Freq: Every evening | ORAL | 1 refills | Status: DC | PRN

## 2022-01-15 MED ORDER — EMPAGLIFLOZIN 25 MG PO TABS: 25.0000 mg | ORAL_TABLET | Freq: Every day | ORAL | 3 refills | Status: DC

## 2022-01-15 MED ORDER — VICTOZA 18 MG/3ML ~~LOC~~ SOPN: PEN_INJECTOR | SUBCUTANEOUS | 3 refills | Status: DC

## 2022-01-15 MED ORDER — LISINOPRIL 2.5 MG PO TABS: 2.5000 mg | ORAL_TABLET | Freq: Every day | ORAL | 3 refills | Status: DC

## 2022-01-15 MED ORDER — TADALAFIL 10 MG PO TABS: 10.0000 mg | ORAL_TABLET | Freq: Every day | ORAL | 2 refills | Status: DC | PRN

## 2022-01-15 MED ORDER — ROSUVASTATIN CALCIUM 40 MG PO TABS: 40.0000 mg | ORAL_TABLET | Freq: Every day | ORAL | 3 refills | Status: DC

## 2022-01-15 MED ORDER — OMEPRAZOLE 40 MG PO CPDR: 40.0000 mg | DELAYED_RELEASE_CAPSULE | Freq: Every day | ORAL | 2 refills | Status: DC

## 2022-01-15 MED ORDER — KETOCONAZOLE 2 % EX GEL
CUTANEOUS | 0 refills | Status: AC
Start: 2022-01-15 — End: ?

## 2022-01-15 NOTE — Assessment & Plan Note (Addendum)
Patient with worsening A1c of 12.7 today.  He has poorly controlled diabetes due to noncompliance as he reported running out of medication over 5 months.  Reports intolerance to metformin in the form of bilateral kidney/back pain and would like to discontinue using metformin. -Recommend continuing his Lantus 36 units daily -Continue Jardiance 25 mg daily and Victoza 1.2 mg daily. -Discontinued metformin per patient's request -Follow up with annual eye exam scheduled for today -Advised patient to call clinic anytime he runs out of medications and doesn't get a refill. -Stressed need for tighter blood glucose control -Ordered lipid panel -Reviewed return precautions -Due to transportation issues and distance patient is to call clinic in 2 weeks with reports of his blood glucose readings and we will consider increasing his Lantus pending blood glucose reading once he has restarted his medication.

## 2022-01-15 NOTE — Patient Instructions (Addendum)
It was wonderful to meet you today. Thank you for allowing me to be a part of your care. Below is a short summary of what we discussed at your visit today:  Your A1c today is high at 12.7  These take your medications daily and call the clinic if you run out of medication or need refill.  Follow-up with your eye exam today.  Refilled your medication today we will obtain labs for cholesterol and your electrolytes.  Placed an order for topical antifungal to apply affected area for 2 weeks.  Call the clinic in 2 weeks to let us know your blood sugar range.  Please bring all of your medications to every appointment!  If you have any questions or concerns, please do not hesitate to contact us via phone or MyChart message.   Jerre Simon, MD [facility] Your A1c today is high at

## 2022-01-16 ENCOUNTER — Other Ambulatory Visit (HOSPITAL_COMMUNITY): Payer: Self-pay

## 2022-01-16 LAB — LIPID PANEL
Chol/HDL Ratio: 4.2 ratio (ref 0.0–5.0)
Cholesterol, Total: 181 mg/dL (ref 100–199)
HDL: 43 mg/dL (ref >39)
LDL Chol Calc (NIH): 115 mg/dL — ABNORMAL HIGH (ref 0–99)
Triglycerides: 130 mg/dL (ref 0–149)
VLDL Cholesterol Cal: 23 mg/dL (ref 5–40)

## 2022-01-17 ENCOUNTER — Telehealth: Payer: Self-pay

## 2022-01-17 ENCOUNTER — Telehealth: Payer: Self-pay | Admitting: *Deleted

## 2022-01-17 NOTE — Telephone Encounter (Signed)
A Prior Authorization was initiated for this patients VICTOZA through CoverMyMeds.   Key: Parke Simmers

## 2022-01-17 NOTE — Telephone Encounter (Signed)
Received fax from pharmacy with the following message. I have placed fax in PCP box for reference if needed. Draycen Leichter Zimmerman Rumple, CMA     Message:  Need clarification about direction Very confusing and quantity.

## 2022-01-17 NOTE — Telephone Encounter (Signed)
Prior Auth deemed as "not applicable". Requested product doesn't require a PA.

## 2022-01-21 ENCOUNTER — Other Ambulatory Visit (HOSPITAL_COMMUNITY): Payer: Self-pay

## 2022-01-21 ENCOUNTER — Telehealth: Payer: Self-pay

## 2022-01-21 NOTE — Telephone Encounter (Signed)
Prior Auth for patients medication DEXCOM G6 TRANSMITTER denied by [facility] MEDICAID via CoverMyMeds.   Reason: Per your health plan's criteria, this product is covered if you meet ONE of the following: (I) You have been able to improve your blood sugar levels (glycemic control). (II) You continue to use an external insulin pump.  Medication is still currently covered ($0 copay), PA considered a reauthorization.   CoverMyMeds Key: [id number]

## 2022-01-21 NOTE — Telephone Encounter (Signed)
Received fax from pharmacy, PA needed on [employer]  Clinical questions submitted via Cover My Meds.  Waiting on response, could take up to 72 hours.  Cover My Meds info: Key: [id number]  Veronda Prude, RN

## 2022-01-22 ENCOUNTER — Other Ambulatory Visit: Payer: Self-pay | Admitting: Student

## 2022-01-22 DIAGNOSIS — E1165 Type 2 diabetes mellitus with hyperglycemia: Secondary | ICD-10-CM

## 2022-01-22 MED ORDER — VICTOZA 18 MG/3ML ~~LOC~~ SOPN: PEN_INJECTOR | SUBCUTANEOUS | 3 refills | Status: DC

## 2022-01-23 ENCOUNTER — Telehealth: Payer: Self-pay | Admitting: Student

## 2022-01-23 NOTE — Telephone Encounter (Signed)
Called patient about his med. Verified his identity before informing him I have updated his Trulicity dose and sent the medication to the pharmacy. The previous medication Sig was confusing for the pharmacy and they left a VM asking for verification after I had refilled his med at his most recent visit.  Informed patient he can pick it up at his pharmacy now.

## 2022-02-07 ENCOUNTER — Encounter: Payer: Self-pay | Admitting: Student

## 2022-03-11 ENCOUNTER — Ambulatory Visit: Payer: Medicaid Other | Admitting: Student

## 2022-03-14 ENCOUNTER — Ambulatory Visit: Payer: Medicaid Other | Admitting: Student

## 2022-05-05 ENCOUNTER — Ambulatory Visit (INDEPENDENT_AMBULATORY_CARE_PROVIDER_SITE_OTHER): Payer: Commercial Managed Care - HMO | Admitting: Student

## 2022-05-05 ENCOUNTER — Encounter: Payer: Self-pay | Admitting: Student

## 2022-05-05 ENCOUNTER — Telehealth: Payer: Self-pay

## 2022-05-05 ENCOUNTER — Ambulatory Visit (INDEPENDENT_AMBULATORY_CARE_PROVIDER_SITE_OTHER): Payer: Commercial Managed Care - HMO | Admitting: Pharmacist

## 2022-05-05 ENCOUNTER — Encounter: Payer: Self-pay | Admitting: Pharmacist

## 2022-05-05 ENCOUNTER — Other Ambulatory Visit (HOSPITAL_COMMUNITY): Payer: Self-pay

## 2022-05-05 VITALS — BP 138/76 | HR 78 | Ht 65.0 in | Wt 268.4 lb

## 2022-05-05 DIAGNOSIS — F339 Major depressive disorder, recurrent, unspecified: Secondary | ICD-10-CM | POA: Diagnosis not present

## 2022-05-05 DIAGNOSIS — E1165 Type 2 diabetes mellitus with hyperglycemia: Secondary | ICD-10-CM

## 2022-05-05 LAB — POCT GLYCOSYLATED HEMOGLOBIN (HGB A1C): HbA1c, POC (controlled diabetic range): 12.7 % — AB (ref 0.0–7.0)

## 2022-05-05 MED ORDER — LANTUS SOLOSTAR 100 UNIT/ML ~~LOC~~ SOPN: 52.0000 [IU] | PEN_INJECTOR | Freq: Every day | SUBCUTANEOUS | 2 refills | Status: DC

## 2022-05-05 MED ORDER — SERTRALINE HCL 25 MG PO TABS: 25.0000 mg | ORAL_TABLET | Freq: Every day | ORAL | 0 refills | Status: DC

## 2022-05-05 MED ORDER — OZEMPIC (1 MG/DOSE) 4 MG/3ML ~~LOC~~ SOPN: 1.0000 mg | PEN_INJECTOR | SUBCUTANEOUS | 3 refills | Status: DC

## 2022-05-05 MED ORDER — METFORMIN HCL ER 500 MG PO TB24: 500.0000 mg | ORAL_TABLET | Freq: Every day | ORAL | 3 refills | Status: DC

## 2022-05-05 MED ORDER — DEXCOM G7 SENSOR MISC: 11 refills | Status: DC

## 2022-05-05 NOTE — Progress Notes (Signed)
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 He works as a Building surveyor. Because he has DOT number on his door the DOT form needs to be completed  Denies any history of seizures Denies any increased somnolence with medication.   Diabetes Patient's current diabetic medications include Jardiance, Lantus and Victoza. Tolerating well without side effects.  Patient endorses compliance with these medications.  He reports taking 48 unit of lantus daily,  Non complaint with diet. Reports eating regular diet an soft drinks. Tries to stay away from sweet potatoes CBG readings averaging in the 300s range.  Patient's last A1c 3 months ago was 12.7. (A1c today is 12.7) Denies abdominal pain, blurred vision, or make episodes. He does endorse polyuria, polydipsia Patient states they understand that diet and exercise can help with his diabetes.  Annual foot is due  and reported being done his annual diabetic eye exam in which he was started on new prescription glasses.  PERTINENT  PMH / PSH: Reviewed   OBJECTIVE:   BP 138/76   Pulse 78   Ht 5\' 5"  (1.651 m)   Wt 268 lb 6.4 oz (121.7 kg)   SpO2 96%   BMI 44.66 kg/m    Physical Exam General: Alert, well appearing, NAD Cardiovascular: RRR, No Murmurs, Normal S2/S2 Respiratory: CTAB, No wheezing or Rales Abdomen: No distension or tenderness Extremities: No edema on extremities   Foot exam: 2+ pedal pulses bilaterally, sensation and muscle tone intact. Calus present on both feet   ASSESSMENT/PLAN:   DM2 (diabetes mellitus, type 2) ([place]) Pt with poorly controlled diabetes. A1c today was 12.7 which is unchanged from 4 months ago. Reports blood glucose reading in the 300s and hyperglycemic symptoms of polyuria and polydipsia. He has a diabetes follow up with the pharmacy team today. Unsure at this point if poor diabetes  control is due to ineffectiveness of treatment or noncompliance given no improvement despite being on multiple diabetes medications. -Advised patient on diet and exercise - See Dr. Graylin Shiver note for any changes on medication/ management     DOT Patient requiring diabetes management for for DOT completion today. Filled out most of the form but informed them unable to complete the form or signed it at this time given no record of his eye exam with the ophthalmologist on file.  Encourage patient to call the ophthalmologist to fax daily send summary so that we can complete the form.  Provided patient's with the fax number for the clinic.  Informed patient I could hold the form and completed once we receive that or he have the option of taking the form with him and bring it back when he is ready.  Patient was understandably upset because he said this will affect his employment, and he decided he would go with the form with plan to return at another time.  Depression Patient's PHQ-9 score of 18.  Reports feeling depressed and has prior diagnosis of depression.  Has been on Wellbutrin in the past which helped his smoking cessation but did not help his depressed mood.  Endorsed tolerance to the medication however ineffective for depression.  No current SI/HI but patient does endorse intermittent thoughts of hurting individual's when he is upset. Lat time he felt that was was a month ago. -Rx Zoloft 25 mg daily -Provided patient with list of Medicaid therapists, advised patient to call the  numbers to schedule an appointment.  Discussed he would strongly benefit from seeing a therapist.   Jerre Simon, MD [facility]

## 2022-05-05 NOTE — Assessment & Plan Note (Signed)
Diabetes longstanding and currently failing to reach treatment goals for someone diagnosed > 20 years ago. Patient is able to verbalize appropriate hypoglycemia management plan. Medication adherence appears good. Control is suboptimal due to lack of adequate regimen, multiple sources of life stress. -Adjusted dose of basal insulin Lantus (insulin glargine) from 48 to 52 units daily and patient will continue to titrate 1 unit every days if fasting blood sugar > 150mg /dl until he reaches dose of 60 units. -Discussed possibility of mealtime rapid insulin at next visit.  -Discontinued GLP-1 Victoza (liraglutide) 1.8mg  daily and Initiated Ozempic (semaglutide) 1mg  once weekly.   -Continued metformin 500mg  XR daily.   Ordered replacement DEXCOM G7 - patient was able to upload software for G7 during visit.  - Educated on Skin Tac use with sensors to improve contact durability.  -Patient educated on purpose, proper use, and potential adverse effects of treatment -Extensively discussed pathophysiology of diabetes, recommended lifestyle interventions, dietary effects on blood sugar control.  -Counseled on s/sx of and management of hypoglycemia.

## 2022-05-05 NOTE — Assessment & Plan Note (Signed)
Pt with poorly controlled diabetes. A1c today was 12.7 which is unchanged from 4 months ago. Reports blood glucose reading in the 300s and hyperglycemic symptoms of polyuria and polydipsia. He has a diabetes follow up with the pharmacy team today. Unsure at this point if poor diabetes control is due to ineffectiveness of treatment or noncompliance given no improvement despite being on multiple diabetes medications. -Advised patient on diet and exercise - See Dr. Macky Lower note for any changes on medication/ management

## 2022-05-05 NOTE — Patient Instructions (Addendum)
It was wonderful to see you today. Thank you for allowing me to be a part of your care. Below is a short summary of what we discussed at your visit today:  Today was 12.7.  Blood glucose is still not well-controlled despite being on multiple diabetic medications.  I strongly recommend improving your diet and also I recommend you follow-up with our pharmacy team to about your diabetes medications.  Please bring your medications to the appointment with our pharmacy team to make sure we are making appropriate changes.  Will like to follow-up with you in a months to reassess your blood sugars.  If you have any hypoglycemic episodes which include feeling generally weak please call us to readjust your Lantus dose.   I am unable to complete your DOT form today due to eye exam.  Please make sure to send those results for the eye exam.  Please have the ophthalmologist fax those results to [name].  Our fax number is [phone]  Screening test for depression showed high probability of depression.  I we will start you on Zoloft which you will take daily.  Also I have provided you with phone number for list of therapist, please call them to schedule an appointment   I strongly believe you will benefit from seeing a therapist.    Follow-up in 2-3 weeks.   Please bring all of your medications to every appointment!  If you have any questions or concerns, please do not hesitate to contact us via phone or MyChart message.   Jerre Simon, MD [facility]    Therapy and Counseling Resources Most providers on this list will take Medicaid. Patients with commercial insurance or Medicare should contact their insurance company to get a list of in network providers.  [facility] (takes children) Location 1: [address] [place], [place] [address] Location 2: [address] [place], [place] [address] [phone]   [facility] (spanish speaking therapist available)(habla  espanol)(take medicare and medicaid)  [address] [place], [place], [place] [address], [place] [email or web address] [phone]  [facility]iat[facility] Counseling [address] [facility] [address] [place], [place] [address] [phone]  [facility]   [address], Suite [place], [place] [address]      [phone]  [facility] (spanish available) [address]  [place], [place] [address] [phone]  [facility] (take [facility] and medicare) [address]  [place], [place] [address]       [phone]     [facility] (virtual only) [phone]  [facility]ov[facility] [address], [place], [place] [phone]  [facility]:  [address] [place] [phone]  [facility]:  [address] [place]ie Diener [phone]  [facility] (under & uninsured) [address]   [place] [phone]    [email or web address]    [facility] [address]  [place]    [phone]  [facility] -[address]     Phone:  [phone]     [facility]-  [address]  [phone]   [facility] [address]      [place], [place] [phone]  [facility]: [address] [place], [place], [place]  [phone]   [facility] (Spanish therapist) [email or web address]  [address]   [place] [address]  [phone]    [facility]   [address],  [place], [place]  [phone]   [facility]  [address] [place]  [phone]  [facility]  [address] [place], [place]        [phone]  Open Access/Walk In Clinic under & uninsured  [facility]  [address] [place], [place] [phone] Crisis  [phone]  [facility] [place],  (Spanish)   [address], [place]: [phone]) 8:30 - 12; 1 - 2:30  [facility] [employer],  [address] [place], [place]    ([phone]):8:30 - 12; 2 - 3PM  [facility] [employer],  [address]  [place]; [phone]):   Mon - Fri 8 AM - 5 PM  [facility] [address] [place]  MWF 12:30 to 3:00 or call to schedule an appointment  [phone]  Specific Provider options Psychology Today  [email or web address] click on find a therapist  enter your zip code left side and select or tailor a therapist for your specific need.   [facility] Provider Directory [email or web address]  (Medicaid)   Follow all drop down to find a provider  Social Support program Mental [facility] [place] [phone] or PhotoSolver.pl [address], [place], [place] Recovery support and educational   24- Hour Availability:   [facility]  [address] [place], [place] [phone] Crisis [phone]  [facility] [phone]  [facility]  [phone]   [facility]  [phone] (after hours)  Therapeutic Alternative/Mobile Crisis   [phone]  [place] National Suicide Hotline  [phone] Len Childs)  Call 911 or go to emergency room  [facility]  [phone]);  [place] and [employer]  [phone]); [place], [place], [place], [place], [place], [place], [place]

## 2022-05-05 NOTE — Patient Instructions (Signed)
Good to see you today!  I hope you are feeling better soon.  Increase your Lantus to 52 units starting tomorrow and ADD 1 unit every day if fasting is >150  until you reach 60 units daily.   STOP Victoza\ Start Ozempic (semglutide) 1mg  once weekly.   I look forward to seeing you again in ~ 1 month.

## 2022-05-05 NOTE — Progress Notes (Signed)
S:     Chief Complaint  Patient presents with   Medication Management    Diabetes   51 y.o. male who presents for diabetes evaluation, education, and management.  PMH is significant for poorly controlled diabetes.  Patient was referred and last seen by Primary Care Provider, Dr. Elliot Gurney, earlier this AM.   At last visit, Diabetes referral was made due to current control.   Today, patient arrives in disgruntled spirits as he was hoping to have his physical completed today and that was not possible.  He presents without any assistance.   Patient reports Diabetes was diagnosed at age 35 (28 years ago).   Current diabetes medications include: Metformin 500mg  XR once daily (intolerance to higher doses), Victoza (liraglutide) 1.8mg  daily, Lantus (insulin glargine)  48 units daily  Patient reports adherence to taking all medications as prescribed.  (Rarely missing doses)  Do you feel that your medications are working for you? no   Patient denies hypoglycemic events.  Consistently > 300mg /dl blood glucose     O:   Review of Systems  Psychiatric/Behavioral:  Positive for depression. The patient is nervous/anxious.     Physical Exam Pulmonary:     Effort: Pulmonary effort is normal.  Neurological:     Mental Status: He is alert.  Psychiatric:        Thought Content: Thought content normal.    Has used Dexcom G6  in the past.  Not currently using.    Lab Results  Component Value Date   HGBA1C 12.7 (A) 05/05/2022    Lipid Panel     Component Value Date/Time   CHOL 181 01/15/2022 1014   TRIG 130 01/15/2022 1014   HDL 43 01/15/2022 1014   CHOLHDL 4.2 01/15/2022 1014   CHOLHDL 4.8 06/22/2014 1635   VLDL 30 06/22/2014 1635   LDLCALC 115 (H) 01/15/2022 1014   LDLDIRECT 70 03/07/2020 1419    Clinical Atherosclerotic Cardiovascular Disease (ASCVD):  The 10-year ASCVD risk score (Arnett DK, et al., 2019) is: 17.9%   Values used to calculate the score:     Age: 49  years     Sex: Male     Is Non-Hispanic African American: No     Diabetic: Yes     Tobacco smoker: Yes     Systolic Blood Pressure: 138 mmHg     Is BP treated: Yes     HDL Cholesterol: 43 mg/dL     Total Cholesterol: 181 mg/dL   Patient is participating in a Managed Medicaid Plan:  Yes   A/P: Diabetes longstanding and currently failing to reach treatment goals for someone diagnosed > 20 years ago. Patient is able to verbalize appropriate hypoglycemia management plan. Medication adherence appears good. Control is suboptimal due to lack of adequate regimen, multiple sources of life stress. -Adjusted dose of basal insulin Lantus (insulin glargine) from 48 to 52 units daily and patient will continue to titrate 1 unit every days if fasting blood sugar > 150mg /dl until he reaches dose of 60 units. - Discussed possibility of mealtime  rapid insulin at next visit.  -Discontinued GLP-1 Victoza (liraglutide) 1.8mg  daily and Initiated Ozempic (semaglutide) 1mg  once weekly.   -Continued metformin 500mg  XR daily.   Ordered replacement DEXCOM G7 - patient was able to upload software for G7 during visit.  - Educated on Skin Tac use with sensors to improve contact durability.  -Patient educated on purpose, proper use, and potential adverse effects of treatment -Extensively discussed pathophysiology  of diabetes, recommended lifestyle interventions, dietary effects on blood sugar control.  -Counseled on s/sx of and management of hypoglycemia.    Written patient instructions provided. Patient verbalized understanding of treatment plan.  Total time in face to face counseling 40 minutes.    Follow-up:  Pharmacist in mid-January. PCP clinic visit in 2-3 weeks.

## 2022-05-05 NOTE — Telephone Encounter (Signed)
A Prior Authorization was initiated for this patients Ozempic (1 MG/DOSE) 4MG /3ML pen-injectors through CoverMyMeds.   Key:  [id number] Rx Patient Advocate

## 2022-05-06 NOTE — Telephone Encounter (Signed)
Prior Auth for patients medication OZEMPIC approved by [employer] MEDICAID from 05/05/22 to 05/06/23.  Key: [id number]

## 2022-05-08 NOTE — Progress Notes (Signed)
Reviewed: I agree with Dr. Koval's documentation and management. 

## 2022-05-13 ENCOUNTER — Telehealth: Payer: Self-pay

## 2022-05-13 ENCOUNTER — Other Ambulatory Visit (HOSPITAL_COMMUNITY): Payer: Self-pay

## 2022-05-13 NOTE — Telephone Encounter (Signed)
A Prior Authorization was initiated for this patients DEXCOM G7 SENSORS through CoverMyMeds.   Key: [id number]

## 2022-05-15 ENCOUNTER — Encounter: Payer: Self-pay | Admitting: Student

## 2022-05-15 NOTE — Telephone Encounter (Signed)
Prior Auth for patients medication DEXCOM G7 SENSOR approved by [employer] from 05/13/22 to 05/13/23.  Key: [id number]

## 2022-05-16 ENCOUNTER — Other Ambulatory Visit (HOSPITAL_COMMUNITY): Payer: Self-pay

## 2022-05-21 ENCOUNTER — Ambulatory Visit: Payer: Medicaid Other | Admitting: Student

## 2022-05-23 ENCOUNTER — Ambulatory Visit: Payer: Medicaid Other | Admitting: Student

## 2022-05-27 ENCOUNTER — Telehealth: Payer: Self-pay

## 2022-05-27 ENCOUNTER — Other Ambulatory Visit (HOSPITAL_COMMUNITY): Payer: Self-pay

## 2022-05-27 NOTE — Telephone Encounter (Signed)
Prior Auth for patients medication DEXCOM G7 SENSORS approved by [place] from 05/27/22 to 05/28/23.  Key: [id number]

## 2022-05-27 NOTE — Telephone Encounter (Signed)
A Prior Authorization was initiated for this patients DEXCOM G7 SENSORS through CoverMyMeds.   Key: [id number]  (TO [place])

## 2022-05-27 NOTE — Telephone Encounter (Signed)
Prior auth cancelled by insurance. Previous approval under this insurance until 05/06/23.  When running test claim, medicaid wants medication submitted to primary insurance - not sure what other insurance patient has. Will reach out.

## 2022-05-27 NOTE — Telephone Encounter (Signed)
A Prior Authorization was initiated for this patients OZEMPIC through CoverMyMeds.   Key: [id number]  (SUBMITTED TO [place])

## 2022-06-05 ENCOUNTER — Ambulatory Visit: Payer: Medicaid Other | Admitting: Pharmacist

## 2022-06-13 ENCOUNTER — Ambulatory Visit (INDEPENDENT_AMBULATORY_CARE_PROVIDER_SITE_OTHER): Payer: Medicaid Other | Admitting: Student

## 2022-06-13 ENCOUNTER — Encounter: Payer: Self-pay | Admitting: Student

## 2022-06-13 VITALS — BP 124/70 | HR 98 | Ht 65.0 in | Wt 257.0 lb

## 2022-06-13 DIAGNOSIS — E1165 Type 2 diabetes mellitus with hyperglycemia: Secondary | ICD-10-CM | POA: Diagnosis present

## 2022-06-13 LAB — POCT GLYCOSYLATED HEMOGLOBIN (HGB A1C): HbA1c, POC (controlled diabetic range): 12.8 % — AB (ref 0.0–7.0)

## 2022-06-13 LAB — GLUCOSE, POCT (MANUAL RESULT ENTRY): POC Glucose: 234 mg/dl — AB (ref 70–99)

## 2022-06-13 MED ORDER — METFORMIN HCL ER (MOD) 1000 MG PO TB24: 1000.0000 mg | ORAL_TABLET | Freq: Every day | ORAL | 2 refills | Status: DC

## 2022-06-13 MED ORDER — DEXCOM G7 SENSOR MISC: 11 refills | Status: DC

## 2022-06-13 NOTE — Assessment & Plan Note (Addendum)
Patient with poorly controlled diabetes with A1c of 12.8 today.  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 -Obtained A1c  -Refilled his Dexcom since insurance approved it.  -Increased his metformin to 1000 mg daily -Provided patient's with his current A1c results and letter with diabetes management plan -Completed his work form which was provided to patient at the visit.

## 2022-06-13 NOTE — Progress Notes (Signed)
    SUBJECTIVE:   CHIEF COMPLAINT / HPI:   Patient is a 52 year old male with history of type 2 diabetes presenting today for work form completion.  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  His form is requesting information about his diabetes management.  He endorses compliance with his medications but still reports his blood sugars are still elevated and has not been able to get his Dexcom due to prior Auth at the time. He also has not been able to get his Ozempic.  Current medication include Lantus which he said he is up to almost 60 units daily, Jardiance 25 mg daily and metformin 500 mg XR daily.  Is any Hypoglycemic episodes.  He has not been able to check his blood glucose lately because he gave away his glucometer while waiting to receive his Dexcom.  A1c a month ago was 12.7, today A1c is 12.8.  PERTINENT  PMH / PSH: Reviewed  OBJECTIVE:   BP 124/70   Pulse 98   Ht 5\' 5"  (1.651 m)   Wt 257 lb (116.6 kg)   SpO2 98%   BMI 42.77 kg/m    Physical Exam General: Alert, well appearing, NAD Cardiovascular: RRR, No Murmurs, Normal S2/S2 Respiratory: CTAB, No wheezing or Rales Abdomen: No distension or tenderness Extremities: No edema on extremities     ASSESSMENT/PLAN:   DM2 (diabetes mellitus, type 2) ([place]) Patient with poorly controlled diabetes with A1c of 12.8 today.  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 -Obtained A1c  -Refilled his Dexcom since insurance approved it.  -Increased his metformin to 1000 mg daily -Provided patient's with his current A1c results and letter with diabetes management plan -Completed his work form which was provided to patient at the visit.     [name], MD [place]

## 2022-06-13 NOTE — Patient Instructions (Signed)
It was wonderful to meet you today. Thank you for allowing me to be a part of your care. Below is a short summary of what we discussed at your visit today:  A1c today is 12.8.  Given your blood glucose is still not well-controlled I have increased your metformin to 1000 mg daily.  Your Dexcom prior Josem Kaufmann has been approved.  I have resent the Dexcom which you should pick up at your pharmacy.  Also completed your form and signed for your job.  Please bring all of your medications to every appointment!  If you have any questions or concerns, please do not hesitate to contact us via phone or MyChart message.   [name], MD [facility]

## 2022-06-16 ENCOUNTER — Telehealth: Payer: Self-pay

## 2022-06-16 ENCOUNTER — Other Ambulatory Visit (HOSPITAL_COMMUNITY): Payer: Self-pay

## 2022-06-16 NOTE — Telephone Encounter (Signed)
Left message informing patient his sensors and pens are covered under his insurance he should not have any issues picking them up from the pharmacy.   Advised patient to call back if he has any questions or concerns

## 2022-06-19 ENCOUNTER — Telehealth: Payer: Self-pay

## 2022-06-19 NOTE — Telephone Encounter (Signed)
Rec'd Pa request from patients pharmacy for Metformin ER 1000mg .  Did you mean to send the [facility] or is patient supposed to be on regular Metformin 1000mg ?

## 2022-06-20 ENCOUNTER — Other Ambulatory Visit (HOSPITAL_COMMUNITY): Payer: Self-pay

## 2022-06-20 NOTE — Telephone Encounter (Signed)
A Prior Authorization was initiated for this patients metFORMIN HCl ER (MOD) 1000MG  er tablets through CoverMyMeds.   Key: [id number]

## 2022-06-23 ENCOUNTER — Other Ambulatory Visit: Payer: Self-pay

## 2022-06-24 MED ORDER — LANTUS SOLOSTAR 100 UNIT/ML ~~LOC~~ SOPN: 52.0000 [IU] | PEN_INJECTOR | Freq: Every day | SUBCUTANEOUS | 2 refills | Status: DC

## 2022-06-24 NOTE — Telephone Encounter (Signed)
Prior Auth for patients medication Metformin ER Tab 1000mg  denied by [place] via CoverMyMeds.   Reason:  Per your health plan's criteria, this drug is covered if you meet the following: One of the following:   (i)You have failed one preferred drug as confirmed by claims history or submission of medical records: glipizide-metformin tablet (generic for Metaglip), glyburide-metformin tablet (generic for Glucovance), metformin tablet (generic for Glucophage).   (ii)You cannot use one preferred drug (please specify contraindication or intolerance).  CoverMyMeds Key:

## 2022-07-16 ENCOUNTER — Other Ambulatory Visit: Payer: Self-pay | Admitting: Student

## 2022-07-16 DIAGNOSIS — F339 Major depressive disorder, recurrent, unspecified: Secondary | ICD-10-CM

## 2022-07-17 ENCOUNTER — Emergency Department: Payer: Medicaid Other

## 2022-07-17 ENCOUNTER — Emergency Department
Admission: EM | Admit: 2022-07-17 | Discharge: 2022-07-17 | Disposition: A | Discharge status: Home / Self Care | Payer: Medicaid Other | Attending: Emergency Medicine | Admitting: Emergency Medicine

## 2022-07-17 ENCOUNTER — Other Ambulatory Visit: Payer: Self-pay

## 2022-07-17 ENCOUNTER — Encounter: Payer: Self-pay | Admitting: Emergency Medicine

## 2022-07-17 DIAGNOSIS — R55 Syncope and collapse: Secondary | ICD-10-CM | POA: Insufficient documentation

## 2022-07-17 LAB — CBC
HCT: 46.3 % (ref 39.0–52.0)
Hemoglobin: 15.3 g/dL (ref 13.0–17.0)
MCH: 30.2 pg (ref 26.0–34.0)
MCHC: 33 g/dL (ref 30.0–36.0)
MCV: 91.3 fL (ref 80.0–100.0)
Platelets: 198 10*3/uL (ref 150–400)
RBC: 5.07 MIL/uL (ref 4.22–5.81)
RDW: 12.6 % (ref 11.5–15.5)
WBC: 9.7 10*3/uL (ref 4.0–10.5)
nRBC: 0 % (ref 0.0–0.2)

## 2022-07-17 LAB — HEPATIC FUNCTION PANEL
ALT: 19 U/L (ref 0–44)
AST: 17 U/L (ref 15–41)
Albumin: 3.7 g/dL (ref 3.5–5.0)
Alkaline Phosphatase: 49 U/L (ref 38–126)
Bilirubin, Direct: <0.1 mg/dL (ref 0.0–0.2)
Total Bilirubin: 0.8 mg/dL (ref 0.3–1.2)
Total Protein: 6.9 g/dL (ref 6.5–8.1)

## 2022-07-17 LAB — URINALYSIS, ROUTINE W REFLEX MICROSCOPIC
Bacteria, UA: NONE SEEN
Bilirubin Urine: NEGATIVE
Glucose, UA: >=500 mg/dL — AB
Hgb urine dipstick: NEGATIVE
Ketones, ur: NEGATIVE mg/dL
Leukocytes,Ua: NEGATIVE
Nitrite: NEGATIVE
Protein, ur: NEGATIVE mg/dL
Specific Gravity, Urine: 1.039 — ABNORMAL HIGH (ref 1.005–1.030)
pH: 5 (ref 5.0–8.0)

## 2022-07-17 LAB — TROPONIN I (HIGH SENSITIVITY)
Troponin I (High Sensitivity): 6 ng/L (ref <18)
Troponin I (High Sensitivity): 7 ng/L (ref <18)

## 2022-07-17 LAB — BASIC METABOLIC PANEL
Anion gap: 10 (ref 5–15)
BUN: 18 mg/dL (ref 6–20)
CO2: 27 mmol/L (ref 22–32)
Calcium: 9 mg/dL (ref 8.9–10.3)
Chloride: 106 mmol/L (ref 98–111)
Creatinine, Ser: 0.59 mg/dL — ABNORMAL LOW (ref 0.61–1.24)
GFR, Estimated: >60 mL/min (ref >60)
Glucose, Bld: 166 mg/dL — ABNORMAL HIGH (ref 70–99)
Potassium: 3.7 mmol/L (ref 3.5–5.1)
Sodium: 143 mmol/L (ref 135–145)

## 2022-07-17 LAB — CBG MONITORING, ED: Glucose-Capillary: 87 mg/dL (ref 70–99)

## 2022-07-17 MED ORDER — SODIUM CHLORIDE 0.9 % IV BOLUS
1000.0000 mL | Freq: Once | INTRAVENOUS | Status: AC
  Administered 2022-07-17: 1000 mL via INTRAVENOUS

## 2022-07-17 NOTE — ED Triage Notes (Signed)
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

## 2022-07-17 NOTE — ED Notes (Signed)
First Nurse Note: Patient to ED via [facility] from work site. Patient was flagging at work then had a syncopal episode. Patient hit head on guardrail. Denies blood thinners. No other complaints.  94 HR 209 cbg 98% RA  20g RAC

## 2022-07-17 NOTE — Discharge Instructions (Addendum)
We discussed admission for syncopal workup in which they would do an echocardiogram and monitor your heart but given you have not had any additional symptoms you have elected to want to leave.  You should return to the ER if you develop chest pain, shortness of breath or return of symptoms.  If you develop another syncopal episode please return to the ER immediately.  Try to stay well-hydrated.  I placed a referral for cardiology.  They should call you to make an appointment

## 2022-07-17 NOTE — ED Notes (Signed)
Pt states he was working outside, standing and holding a sign and then found himself on the ground. Pt states he passed out.

## 2022-07-17 NOTE — ED Provider Notes (Signed)
[facility] Provider Note    Event Date/Time   First MD Initiated Contact with Patient 07/17/22 1434     (approximate)   History   Loss of Consciousness   HPI  Derrick Gonzales is a 52 y.o. male with diabetes who comes in with concerns for syncopal episode.  Patient reports that he was at work.  He did not eat breakfast but he states that is typical for him.  He states that he just went out and woke up on the ground.  Denies any prodromal symptoms.  Denies any chest pain shortness of breath.  Does think he hit his head but he was wearing a hard hat so does not really have any significant pain.  Denies any abdominal pain.  Denies any episodes of syncope previously.  He does report that he has not been drinking as much as his typical and he thinks that he could be somewhat dehydrated.   Physical Exam   Triage Vital Signs: ED Triage Vitals  Enc Vitals Group     BP 07/17/22 1257 130/84     Pulse Rate 07/17/22 1257 95     Resp 07/17/22 1257 17     Temp 07/17/22 1257 98 F (36.7 C)     Temp Source 07/17/22 1257 Oral     SpO2 07/17/22 1257 95 %     Weight 07/17/22 1255 260 lb (117.9 kg)     Height 07/17/22 1443 '5\' 5"'$  (1.651 m)     Head Circumference --      Peak Flow --      Pain Score 07/17/22 1255 0     Pain Loc --      Pain Edu? --      Excl. in [place]? --     Most recent vital signs: Vitals:   07/17/22 1257  BP: 130/84  Pulse: 95  Resp: 17  Temp: 98 F (36.7 C)  SpO2: 95%     General: Awake, no distress.  CV:  Good peripheral perfusion.  Resp:  Normal effort.  Abd:  No distention.  Other:  Abdomen soft and nontender.  Equal strength in arms and legs.  Cranial nerves appear intact.  Clear lung sounds.   ED Results / Procedures / Treatments   Labs (all labs ordered are listed, but only abnormal results are displayed) Labs Reviewed  BASIC METABOLIC PANEL - Abnormal; Notable for the following components:      Result Value   Glucose,  Bld 166 (*)    Creatinine, Ser 0.59 (*)    All other components within normal limits  URINALYSIS, ROUTINE W REFLEX MICROSCOPIC - Abnormal; Notable for the following components:   Color, Urine YELLOW (*)    APPearance CLEAR (*)    Specific Gravity, Urine 1.039 (*)    Glucose, UA >=500 (*)    All other components within normal limits  CBC  HEPATIC FUNCTION PANEL  CBG MONITORING, ED  TROPONIN I (HIGH SENSITIVITY)  TROPONIN I (HIGH SENSITIVITY)     EKG  My interpretation of EKG:  Normal sinus rhythm 95 without any ST elevation or T wave inversions, right bundle branch block.  This looks similar to prior EKGs  RADIOLOGY I have reviewed the CT had personally interpreted no evidence of intercranial hemorrhage  PROCEDURES:  Critical Care performed: No  Procedures   MEDICATIONS ORDERED IN ED: Medications  sodium chloride 0.9 % bolus 1,000 mL (has no administration in time range)  IMPRESSION / MDM / ASSESSMENT AND PLAN / ED COURSE  I reviewed the triage vital signs and the nursing notes.   Patient's presentation is most consistent with acute presentation with potential threat to life or bodily function.   Patient comes in with syncopal episode.  Differential includes arrhythmia, ACS, dehydration, Electra abnormalities.  Will get CT head given concern that he hit it to make sure no evidence of intracranial hemorrhage.  Abdomen soft and nontender denies any shortness of breath to suggest PE.  He reports feeling at his baseline self at this time.  Specific gravity is slightly elevated so this could be some signs of dehydration but no evidence of UTI.  Troponins are negative x 2.  Liver test are normal BMP shows sugar of 166 no evidence of DKA.  CBC reassuring.  Considered admission.  Offered patient admission.  Patient declined admission he is negative by [facility] syncope rules.  Discussed the small risk that this could be related to his heart and he could have sudden  death or other cardiac issues that could have caused this specially without him having any prodromal symptoms however patient reports he is not from here he does not want to spend the night.  He is willing to follow-up outpatient with cardiology to discuss Holter monitor, echocardiogram   5:01 PM reevaluated patient.  Remains without any symptoms.  Abdomen remains soft and nontender.  He feels comfortable with discharge home and again declines admission but will follow-up outpatient with cardiology we discussed adequate hydration.  The patient is on the cardiac monitor to evaluate for evidence of arrhythmia and/or significant heart rate changes.      FINAL CLINICAL IMPRESSION(S) / ED DIAGNOSES   Final diagnoses:  Syncope and collapse     Rx / DC Orders   ED Discharge Orders     None        Note:  This document was prepared using Dragon voice recognition software and may include unintentional dictation errors.   Vanessa Vansant, MD 07/17/22 [phone]

## 2022-07-21 ENCOUNTER — Ambulatory Visit (INDEPENDENT_AMBULATORY_CARE_PROVIDER_SITE_OTHER): Payer: Medicaid Other | Admitting: Family Medicine

## 2022-07-21 ENCOUNTER — Encounter: Payer: Self-pay | Admitting: Family Medicine

## 2022-07-21 VITALS — BP 130/84 | HR 90 | Ht 65.0 in | Wt 264.0 lb

## 2022-07-21 DIAGNOSIS — E1165 Type 2 diabetes mellitus with hyperglycemia: Secondary | ICD-10-CM

## 2022-07-21 LAB — POCT GLYCOSYLATED HEMOGLOBIN (HGB A1C): HbA1c, POC (controlled diabetic range): 9.6 % — AB (ref 0.0–7.0)

## 2022-07-21 NOTE — Progress Notes (Cosign Needed Addendum)
    SUBJECTIVE:   CHIEF COMPLAINT / HPI:   Diabetes F/u  Patient reports he has been taking his insulin at 56 units daily. Says that metformin at 1000 mg "tears up" his stomach. He can only tolerate 500 mg at a time. Reports he has been more stressed and so he has been eating more poorly recently. He is motivated to improve his diabetes however, as he saw his cousin die due to diabetes. He does not want to be taking many more medications. He says he just started the ozempic as he could not get it from his pharmacy earlier since it was on back order.   PERTINENT  PMH / PSH: T2DM  OBJECTIVE:   BP 130/84   Pulse 90   Ht 5\' 5"  (1.651 m)   Wt 264 lb (119.7 kg)   SpO2 97%   BMI 43.93 kg/m   General: well appearing, in no acute distress, obese CV: RRR, radial pulses equal and palpable, no BLE edema  Resp: Normal work of breathing on room air Abd: Soft, non tender, non distended  Neuro: Alert & Oriented x 4    ASSESSMENT/PLAN:   DM2 (diabetes mellitus, type 2) ([place]) Much improved! Now < 10. However, is still not in goal.  Says he checks his sugars every couple of days. Is scared to titrate his insulin at home too closely  - Increase long acting insulin by 1 u every day the fasting glucose in the AM is greater than 200.  - Increase metformin to 500 BID.  - Continue ozempic  - Continue jardiance  - F/u in 3 months.      Lowry Ram, MD [place]

## 2022-07-21 NOTE — Patient Instructions (Signed)
It was wonderful to see you today.  Please bring ALL of your medications with you to every visit.   Today we talked about:  Diabetes - We decided to increase your metformin to 500 twice a day. Take it with with meals.  Increase your morning insulin by 1 point if your glucose is over 200.  Continue taking ozempic.   Please follow up in 3 months   Thank you for choosing [facility].   Please call [phone] with any questions about today's appointment.  Please be sure to schedule follow up at the front desk before you leave today.   Lowry Ram, MD  Family Medicine

## 2022-07-24 NOTE — Assessment & Plan Note (Addendum)
Much improved! Now < 10. However, is still not in goal.  Says he checks his sugars every couple of days. Is scared to titrate his insulin at home too closely  - Increase long acting insulin by 1 u every day the fasting glucose in the AM is greater than 200.  - Increase metformin to 500 BID.  - Continue ozempic  - Continue jardiance  - F/u in 3 months.

## 2022-08-10 ENCOUNTER — Other Ambulatory Visit: Payer: Self-pay | Admitting: Student

## 2022-08-10 DIAGNOSIS — F339 Major depressive disorder, recurrent, unspecified: Secondary | ICD-10-CM

## 2022-09-01 ENCOUNTER — Other Ambulatory Visit: Payer: Self-pay | Admitting: Student

## 2022-09-01 DIAGNOSIS — G8929 Other chronic pain: Secondary | ICD-10-CM

## 2022-09-01 DIAGNOSIS — E118 Type 2 diabetes mellitus with unspecified complications: Secondary | ICD-10-CM

## 2022-09-01 DIAGNOSIS — R12 Heartburn: Secondary | ICD-10-CM

## 2022-09-01 DIAGNOSIS — E1165 Type 2 diabetes mellitus with hyperglycemia: Secondary | ICD-10-CM

## 2022-09-02 ENCOUNTER — Telehealth: Payer: Self-pay

## 2022-09-02 ENCOUNTER — Other Ambulatory Visit (HOSPITAL_COMMUNITY): Payer: Self-pay

## 2022-09-02 NOTE — Telephone Encounter (Signed)
Medication Samples have been provided to the patient.  Drug name: LANTUS SOLOSTAR       Strength: 100U/ML        Qty: 2 PENS  LOT: [id number]  Exp.Date: 01/13/25  Dosing instructions: INJECT 36 UNITS ONCE DAILY  Cybill Uriegas N [place] 3:24 PM 09/02/2022

## 2022-09-02 NOTE — Telephone Encounter (Signed)
Patient calls nurse line reporting financial hardship.   He reports he recently lost his medicaid and states, "I will not be getting it back." He also reports he does not work and has no source of "extra" income. He reports no health insurance a this time.   He reports he can not afford the DM mediations sent in yesterday to [facility].   Patient appreciative of any assistance from Pharmacy Team.

## 2022-09-02 NOTE — Telephone Encounter (Signed)
Reached out to pt regarding medication issues.  After doing insurance verification, patient DOES have medicaid HOWEVER they are showing he has a primary/commercial coverage. Informed patient that this has been quite common lately, and that he needs to reach out to medicaid to see who they're showing as his primary insurance, and let them know he isnt enrolled with them. Once he gets that information, or gets the commercial/primary coverage cleared, medicaid should be able to cover him.   Pt says the only medication he is needing as of now is the lantus. Let pt know I will set him some samples aside. Also suggested he reach back out to me and let me know the results AND when he is low on any medication. I can find a pharmacy with a reasonable cash price for him while he gets insurance handled. Pt agreed.

## 2022-09-04 ENCOUNTER — Other Ambulatory Visit (HOSPITAL_COMMUNITY): Payer: Self-pay

## 2022-09-08 ENCOUNTER — Other Ambulatory Visit (HOSPITAL_COMMUNITY): Payer: Self-pay

## 2022-09-08 ENCOUNTER — Telehealth: Payer: Self-pay

## 2022-09-08 NOTE — Telephone Encounter (Signed)
A Prior Authorization was initiated for this patients JARDIANCE through CoverMyMeds.   Key: [id number]

## 2022-09-08 NOTE — Telephone Encounter (Signed)
Prior Auth for patients medication JARDIANCE approved by [employer] from 09/08/22 to 09/08/23.  CoverMyMeds Key: [id number] PA Case ID #:  [id number]

## 2022-10-10 ENCOUNTER — Ambulatory Visit: Payer: Medicaid Other | Admitting: Student

## 2022-10-10 NOTE — Progress Notes (Deleted)
    SUBJECTIVE:   CHIEF COMPLAINT / HPI:   DM2 Here for an A1c check. Last check was 9.6% just under 3 months ago.  Current meds include jardiance 25mg  daily, lantus 36 units daily, metformin ***, Ozempic 1mg  weekly. Is on a statin--Crestor 40mg  daily.   PERTINENT  PMH / PSH: ***  OBJECTIVE:   There were no vitals taken for this visit.  ***  ASSESSMENT/PLAN:   No problem-specific Assessment & Plan notes found for this encounter.     Eliezer Mccoy, MD [facility]

## 2022-10-14 ENCOUNTER — Ambulatory Visit (INDEPENDENT_AMBULATORY_CARE_PROVIDER_SITE_OTHER): Payer: Medicaid Other | Admitting: Family Medicine

## 2022-10-14 ENCOUNTER — Encounter: Payer: Self-pay | Admitting: Family Medicine

## 2022-10-14 VITALS — BP 132/70 | HR 87 | Ht 65.0 in | Wt 279.0 lb

## 2022-10-14 DIAGNOSIS — E1165 Type 2 diabetes mellitus with hyperglycemia: Secondary | ICD-10-CM

## 2022-10-14 DIAGNOSIS — W57XXXA Bitten or stung by nonvenomous insect and other nonvenomous arthropods, initial encounter: Secondary | ICD-10-CM | POA: Insufficient documentation

## 2022-10-14 DIAGNOSIS — F324 Major depressive disorder, single episode, in partial remission: Secondary | ICD-10-CM

## 2022-10-14 DIAGNOSIS — S40262A Insect bite (nonvenomous) of left shoulder, initial encounter: Secondary | ICD-10-CM | POA: Diagnosis not present

## 2022-10-14 DIAGNOSIS — F329 Major depressive disorder, single episode, unspecified: Secondary | ICD-10-CM | POA: Insufficient documentation

## 2022-10-14 DIAGNOSIS — F339 Major depressive disorder, recurrent, unspecified: Secondary | ICD-10-CM

## 2022-10-14 LAB — POCT GLYCOSYLATED HEMOGLOBIN (HGB A1C): HbA1c, POC (controlled diabetic range): 8.1 % — AB (ref 0.0–7.0)

## 2022-10-14 MED ORDER — SERTRALINE HCL 50 MG PO TABS: 50.0000 mg | ORAL_TABLET | Freq: Every day | ORAL | 0 refills | Status: DC

## 2022-10-14 NOTE — Patient Instructions (Signed)
It was wonderful to see you today.  Please bring ALL of your medications with you to every visit.   Today we talked about:  Diabetes - You're doing great! Keep working on your insulin dose. When you check your blood sugar in the morning, if it is over 150, increase your insulin dose by 1 unit. Next day do the same thing, until your blood sugar is less than 150 in the morning.   Tick bite - If it starts to hurt or ooze more, or you start to get fevers, please follow up.   Please follow up in 3 months   Thank you for choosing [facility].   Please call [phone] with any questions about today's appointment.  Please be sure to schedule follow up at the front desk before you leave today.   Lockie Mola, MD  Family Medicine

## 2022-10-14 NOTE — Progress Notes (Signed)
    SUBJECTIVE:   CHIEF COMPLAINT / HPI:   Diabetes f/u  Patient reports he has been having side effects to the ozempic. Reports odorous burps, bloated abdomen , and constipation. He did not know he was supposed to eat smaller (but more frequent) meals. Was working on increasing his insulin dose based on morning glucose checks. Got up to 60s, but then stopped. Mentioned he thought he needed a nighttime insulin dose because he wakes up with sugards in 200s-300s. Patient denies any wounds or ulcers on feet and says he gets regular pedicures.   Tick Bite  Pulled a tick off about a month ago. No fevers; however, the spot still oozes and bleeding occasionally. He does itch the spot. He says he feels more slow recently.   Hypertension  Patient says he is taking his medication nightly. No side effects. 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   Depression  Says his depression could be better. Takes his zoloft daily and feels like it helps, but says there is room to go up. Denies SI, HI. Is amenable to increasing dose. No side effects from medication.   PERTINENT  PMH / PSH: T2DM on insulin, HTN, ED  OBJECTIVE:   BP 132/70   Pulse 87   Ht 5\' 5"  (1.651 m)   Wt 279 lb (126.6 kg)   SpO2 94%   BMI 46.43 kg/m   General: well appearing, in no acute distress CV: RRR, radial pulses equal and palpable, no BLE edema  Resp: Normal work of breathing on room air, CTAB Abd: Soft, non tender, non distended but full, tympanic  Neuro: Alert & Oriented x 4  Skin: left back shoulder with 1 cm excoriated lesion well healing with central ulceration    ASSESSMENT/PLAN:   Type 2 diabetes mellitus with hyperglycemia, without long-term current use of insulin (HCC) Assessment & Plan: A1c 8.1. Patient has improved control from last visit. Has been increasing his insulin per instructions; however, not ideal as patient still has fasting BG  200s-300s. Ozempic might have also helped; however, not consistent as patient has problems with access due to backorder. He is amenable to continue trying same dose of ozempic after counseling.  - F/u ozempic tolerance (patient instructed to let us know if he is not tolerating)  - Continue titrating insulin as appropriate  - Continue foot care  - F/u Uacr  - F/u in 2-3 months   Orders: -     POCT glycosylated hemoglobin (Hb A1C) -     Microalbumin / creatinine urine ratio  Depression, recurrent (HCC) -     Sertraline HCl; Take 1 tablet (50 mg total) by mouth daily.  Dispense: 30 tablet; Refill: 0  Major depressive disorder in partial remission, unspecified whether recurrent (HCC) Assessment & Plan: PHQ-9 score 13 today without SI. Could be improved. Patient feels sertraline is helping but not enough.  - Increase dose to 50 mg and will follow up in 3 months    Tick bite of left shoulder, initial encounter Assessment & Plan: Tick bite ulceration of skin partially healed. Unlikely infected as only mildly erythematous and mostly healed. Bleeding is most likely from excoriation. Does not appear to be carcinomatous. Unlikely patient has sequelae from tick bite.  - Follow up as needed.        Lockie Mola, MD [facility]

## 2022-10-14 NOTE — Assessment & Plan Note (Signed)
A1c 8.1. Patient has improved control from last visit. Has been increasing his insulin per instructions; however, not ideal as patient still has fasting BG 200s-300s. Ozempic might have also helped; however, not consistent as patient has problems with access due to backorder. He is amenable to continue trying same dose of ozempic after counseling.  - F/u ozempic tolerance (patient instructed to let us know if he is not tolerating)  - Continue titrating insulin as appropriate  - Continue foot care  - F/u Uacr  - F/u in 2-3 months

## 2022-10-14 NOTE — Assessment & Plan Note (Signed)
Tick bite ulceration of skin partially healed. Unlikely infected as only mildly erythematous and mostly healed. Bleeding is most likely from excoriation. Does not appear to be carcinomatous. Unlikely patient has sequelae from tick bite.  - Follow up as needed.

## 2022-10-14 NOTE — Assessment & Plan Note (Signed)
PHQ-9 score 13 today without SI. Could be improved. Patient feels sertraline is helping but not enough.  - Increase dose to 50 mg and will follow up in 3 months

## 2022-10-15 LAB — MICROALBUMIN / CREATININE URINE RATIO
Creatinine, Urine: 74.1 mg/dL
Microalb/Creat Ratio: 6 mg/g creat (ref 0–29)
Microalbumin, Urine: 4.2 ug/mL

## 2022-10-17 NOTE — Progress Notes (Signed)
Your labs were normal! A1c much improved to 8.1.

## 2022-10-21 ENCOUNTER — Encounter: Payer: Self-pay | Admitting: Family Medicine

## 2022-10-21 ENCOUNTER — Ambulatory Visit (INDEPENDENT_AMBULATORY_CARE_PROVIDER_SITE_OTHER): Payer: Medicaid Other | Admitting: Family Medicine

## 2022-10-21 VITALS — BP 134/74 | HR 100 | Ht 65.0 in | Wt 279.0 lb

## 2022-10-21 DIAGNOSIS — N529 Male erectile dysfunction, unspecified: Secondary | ICD-10-CM | POA: Diagnosis not present

## 2022-10-21 DIAGNOSIS — E1165 Type 2 diabetes mellitus with hyperglycemia: Secondary | ICD-10-CM | POA: Diagnosis present

## 2022-10-21 MED ORDER — DEXCOM G7 SENSOR MISC: 1.0000 | 10 refills | Status: DC

## 2022-10-21 MED ORDER — TADALAFIL 10 MG PO TABS: 10.0000 mg | ORAL_TABLET | Freq: Every day | ORAL | 2 refills | Status: DC | PRN

## 2022-10-22 NOTE — Progress Notes (Signed)
    SUBJECTIVE:   CHIEF COMPLAINT / HPI:   Form completion  Patient is at office today for completion of form for work regarding insulin dose and control of diabetes. Patient is a [employer].   PERTINENT  PMH / PSH: T2DM, HTN   OBJECTIVE:   BP 134/74   Pulse 100   Ht 5\' 5"  (1.651 m)   Wt 279 lb (126.6 kg)   SpO2 97%   BMI 46.43 kg/m   General: well appearing, in no acute distress CV: RRR, radial pulses equal and palpable, no BLE edema  Resp: Normal work of breathing on room air, CTAB Abd: Soft, non tender, non distended  Neuro: Alert & Oriented x 4    ASSESSMENT/PLAN:   Form completion  Form was completed with lab records provided. Copy was made and put into the chart. Patient has improving glucose control with A1c on 10/14/2022 8.1 from 12.8 just four months prior. Now on stable insulin regimen and is monitoring blood sugars continuously using CGM. Has not had any lows.   Appropriate refills were made.   Type 2 diabetes mellitus with hyperglycemia, without long-term current use of insulin (HCC) -     Dexcom G7 Sensor; 1 Device by Does not apply route See admin instructions. Replace sensor every 10 days  Dispense: 1 each; Refill: 10  Erectile dysfunction, unspecified erectile dysfunction type -     Tadalafil; Take 1 tablet (10 mg total) by mouth daily as needed for up to 60 doses for erectile dysfunction.  Dispense: 20 tablet; Refill: 2      Lockie Mola, MD [facility] [address]

## 2022-12-03 ENCOUNTER — Other Ambulatory Visit: Payer: Self-pay | Admitting: Family Medicine

## 2022-12-03 DIAGNOSIS — E7800 Pure hypercholesterolemia, unspecified: Secondary | ICD-10-CM

## 2022-12-19 ENCOUNTER — Ambulatory Visit (INDEPENDENT_AMBULATORY_CARE_PROVIDER_SITE_OTHER): Payer: Medicaid Other | Admitting: Family Medicine

## 2022-12-19 ENCOUNTER — Encounter: Payer: Self-pay | Admitting: Family Medicine

## 2022-12-19 ENCOUNTER — Other Ambulatory Visit: Payer: Self-pay

## 2022-12-19 VITALS — BP 126/72 | Ht 65.0 in | Wt 278.4 lb

## 2022-12-19 DIAGNOSIS — M25511 Pain in right shoulder: Secondary | ICD-10-CM | POA: Diagnosis not present

## 2022-12-19 DIAGNOSIS — N529 Male erectile dysfunction, unspecified: Secondary | ICD-10-CM | POA: Diagnosis not present

## 2022-12-19 DIAGNOSIS — R12 Heartburn: Secondary | ICD-10-CM | POA: Diagnosis not present

## 2022-12-19 DIAGNOSIS — E1165 Type 2 diabetes mellitus with hyperglycemia: Secondary | ICD-10-CM

## 2022-12-19 DIAGNOSIS — F339 Major depressive disorder, recurrent, unspecified: Secondary | ICD-10-CM

## 2022-12-19 MED ORDER — LISINOPRIL 2.5 MG PO TABS: 2.5000 mg | ORAL_TABLET | Freq: Every day | ORAL | 1 refills | Status: DC

## 2022-12-19 MED ORDER — OMEPRAZOLE 40 MG PO CPDR: 40.0000 mg | DELAYED_RELEASE_CAPSULE | Freq: Every day | ORAL | 2 refills | Status: DC

## 2022-12-19 MED ORDER — EMPAGLIFLOZIN 25 MG PO TABS: 25.0000 mg | ORAL_TABLET | Freq: Every day | ORAL | 0 refills | Status: DC

## 2022-12-19 MED ORDER — TADALAFIL 10 MG PO TABS: 10.0000 mg | ORAL_TABLET | Freq: Every day | ORAL | 2 refills | Status: DC | PRN

## 2022-12-19 MED ORDER — SERTRALINE HCL 50 MG PO TABS: 50.0000 mg | ORAL_TABLET | Freq: Every day | ORAL | 2 refills | Status: AC

## 2022-12-19 NOTE — Patient Instructions (Addendum)
It was wonderful to see you today.  Please bring ALL of your medications with you to every visit.   Today we talked about:  Your right shoulder pain. Please continue taking the ibuprofen 600 mg. You can take that up to 4 times a day.  Please do not take any Aleve dual action. If you would like some more pain relief you can take just Tylenol. Please continue to apply IcyHot heat packs for your shoulder/helps. Please follow-up with your orthopedic as recommended by Worker's Comp.  We also refilled some of your medications today.  Thank you for choosing [facility].   Please call [phone] with any questions about today's appointment.  Please arrive at least 15 minutes prior to your scheduled appointments.   If you had blood work today, I will send you a MyChart message or a letter if results are normal. Otherwise, I will give you a call.   If you had a referral placed, they will call you to set up an appointment. Please give [name] a call if you don't hear back in the next 2 weeks.   If you need additional refills before your next appointment, please call your pharmacy first.   Hal Morales, MD Family Medicine

## 2022-12-19 NOTE — Assessment & Plan Note (Signed)
About 2 weeks ago patient had incident at work in which she was lifting a machinery and felt a ripping sensation in his right shoulder.  He presents to urgent care and states he was found to have a broken clavicle. He has been wearing a brace and using 600 mg of ibuprofen daily along with Voltaren gel and Aleve dual action.  He reports this is only helping him slightly. He reports Worker's Comp. is involved and he is scheduled to see a orthopedist once approved by [employer]. Unable to review imaging or note by urgent care today as I did not have access to those records.  Patient had slight elevation on proximal clavicle.  No crepitus heard.  Patient has limited range of motion with extension, flexion, and adduction right shoulder. - continue pain control with ibuprofen 600mg  q6h prn, icy hot, and tylenol prn - follow up with orthopaedist - if pain is not controlled, will need to follow up and need new imaging and work up here or access to records.

## 2022-12-19 NOTE — Progress Notes (Signed)
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 He subsequently went to [facility] and was told he had a broken clavicle on his right shoulder.  He reports that past med gave him a sling and he was told to follow-up with his primary care provider and orthopedist. Reports since then the pain in his shoulder has been really bad.  He states that he is unable to really move his arm and has been keeping it in the sling for most of the day.  He reports dull pain at all times but if he tries to move it too much she will feel a sharp pain in his mid to proximal clavicle. He reports he is taking 600 mg of ibuprofen daily with some Voltaren gel and IcyHot. He reports that these do not really work for him. He states that sometimes he will try the Aleve dual action which helps a little bit. Patient was give sling by [facility].   PERTINENT  PMH / PSH:  Reviewed.    OBJECTIVE:   BP 126/72   Ht 5\' 5"  (1.651 m)   Wt 278 lb 6.4 oz (126.3 kg)   BMI 46.33 kg/m   Physical Exam Constitutional:      Appearance: Normal appearance.  Eyes:     Extraocular Movements: Extraocular movements intact.     Pupils: Pupils are equal, round, and reactive to light.  Cardiovascular:     Rate and Rhythm: Normal rate and regular rhythm.  Pulmonary:     Effort: Pulmonary effort is normal. No respiratory distress.  Musculoskeletal:     Right shoulder: Swelling present. No deformity, laceration or crepitus.       Arms:     Comments: Swelling to proximal to mid R clavicle with associated tenderness.  Decreased ROM with flexion, extension, and adduction. No changes on extremal and internal rotation.    Neurological:     General: No focal deficit present.     Mental Status: He is alert and oriented to person, place, and time.  Psychiatric:        Mood and Affect: Mood normal.         Behavior: Behavior normal.     ASSESSMENT/PLAN:   Right shoulder pain About 2 weeks ago patient had incident at work in which she was lifting a machinery and felt a ripping sensation in his right shoulder.  He presents to urgent care and states he was found to have a broken clavicle. He has been wearing a brace and using 600 mg of ibuprofen daily along with Voltaren gel and Aleve dual action.  He reports this is only helping him slightly. He reports Worker's Comp. is involved and he is scheduled to see a orthopedist once approved by [employer]. Unable to review imaging or note by urgent care today as I did not have access to those records.  Patient had slight elevation on proximal clavicle.  No crepitus heard.  Patient has limited range of motion with extension, flexion, and adduction right shoulder. - continue pain control with ibuprofen 600mg  q6h prn, icy hot, and tylenol prn - follow up with orthopaedist - if pain is not controlled, will need to follow up and need new imaging and work up here or access to records.    Refilled the follow medications: Jardiance 25 mg Lisinopril 2.5  mg Omeprazole 40mg  Zoloft 50 mg Cialis 10mg    Patient to follow up with PCP regarding chronic medical management.  Hal Morales, MD [facility]

## 2023-02-09 ENCOUNTER — Other Ambulatory Visit: Payer: Self-pay | Admitting: Student

## 2023-02-16 ENCOUNTER — Emergency Department: Payer: Medicaid Other

## 2023-02-16 ENCOUNTER — Observation Stay
Admission: EM | Admit: 2023-02-16 | Discharge: 2023-02-17 | Discharge status: Against Medical Advice | Payer: Medicaid Other | Attending: Internal Medicine | Admitting: Internal Medicine

## 2023-02-16 DIAGNOSIS — K219 Gastro-esophageal reflux disease without esophagitis: Secondary | ICD-10-CM | POA: Diagnosis present

## 2023-02-16 DIAGNOSIS — Z720 Tobacco use: Secondary | ICD-10-CM | POA: Diagnosis present

## 2023-02-16 DIAGNOSIS — R079 Chest pain, unspecified: Principal | ICD-10-CM | POA: Diagnosis present

## 2023-02-16 DIAGNOSIS — I2510 Atherosclerotic heart disease of native coronary artery without angina pectoris: Secondary | ICD-10-CM | POA: Diagnosis not present

## 2023-02-16 DIAGNOSIS — E119 Type 2 diabetes mellitus without complications: Secondary | ICD-10-CM | POA: Insufficient documentation

## 2023-02-16 DIAGNOSIS — F17210 Nicotine dependence, cigarettes, uncomplicated: Secondary | ICD-10-CM | POA: Insufficient documentation

## 2023-02-16 DIAGNOSIS — I10 Essential (primary) hypertension: Secondary | ICD-10-CM | POA: Insufficient documentation

## 2023-02-16 DIAGNOSIS — Z7985 Long-term (current) use of injectable non-insulin antidiabetic drugs: Secondary | ICD-10-CM | POA: Insufficient documentation

## 2023-02-16 DIAGNOSIS — Z794 Long term (current) use of insulin: Secondary | ICD-10-CM | POA: Diagnosis not present

## 2023-02-16 DIAGNOSIS — R0789 Other chest pain: Secondary | ICD-10-CM | POA: Diagnosis present

## 2023-02-16 DIAGNOSIS — Z6841 Body Mass Index (BMI) 40.0 and over, adult: Secondary | ICD-10-CM | POA: Insufficient documentation

## 2023-02-16 DIAGNOSIS — F329 Major depressive disorder, single episode, unspecified: Secondary | ICD-10-CM | POA: Diagnosis present

## 2023-02-16 DIAGNOSIS — Z79899 Other long term (current) drug therapy: Secondary | ICD-10-CM | POA: Diagnosis not present

## 2023-02-16 DIAGNOSIS — Z7984 Long term (current) use of oral hypoglycemic drugs: Secondary | ICD-10-CM | POA: Diagnosis not present

## 2023-02-16 LAB — CBC
HCT: 44.4 % (ref 39.0–52.0)
Hemoglobin: 14.9 g/dL (ref 13.0–17.0)
MCH: 29.9 pg (ref 26.0–34.0)
MCHC: 33.6 g/dL (ref 30.0–36.0)
MCV: 89 fL (ref 80.0–100.0)
Platelets: 212 10*3/uL (ref 150–400)
RBC: 4.99 MIL/uL (ref 4.22–5.81)
RDW: 12.4 % (ref 11.5–15.5)
WBC: 10.2 10*3/uL (ref 4.0–10.5)
nRBC: 0 % (ref 0.0–0.2)

## 2023-02-16 LAB — BASIC METABOLIC PANEL
Anion gap: 10 (ref 5–15)
BUN: 20 mg/dL (ref 6–20)
CO2: 24 mmol/L (ref 22–32)
Calcium: 8.9 mg/dL (ref 8.9–10.3)
Chloride: 105 mmol/L (ref 98–111)
Creatinine, Ser: 0.69 mg/dL (ref 0.61–1.24)
GFR, Estimated: >60 mL/min (ref >60)
Glucose, Bld: 224 mg/dL — ABNORMAL HIGH (ref 70–99)
Potassium: 3.6 mmol/L (ref 3.5–5.1)
Sodium: 139 mmol/L (ref 135–145)

## 2023-02-16 LAB — D-DIMER, QUANTITATIVE: D-Dimer, Quant: <0.27 ug{FEU}/mL (ref 0.00–0.50)

## 2023-02-16 LAB — TROPONIN I (HIGH SENSITIVITY): Troponin I (High Sensitivity): 8 ng/L (ref <18)

## 2023-02-16 MED ORDER — NITROGLYCERIN 0.4 MG SL SUBL
0.4000 mg | SUBLINGUAL_TABLET | Freq: Once | SUBLINGUAL | Status: AC
  Administered 2023-02-16: 0.4 mg via SUBLINGUAL
  Filled 2023-02-16: qty 1

## 2023-02-16 MED ORDER — ASPIRIN 81 MG PO CHEW: 324.0000 mg | CHEWABLE_TABLET | Freq: Once | ORAL | Status: DC

## 2023-02-16 NOTE — ED Provider Notes (Incomplete)
[facility] Provider Note    Event Date/Time   First MD Initiated Contact with Patient 02/16/23 2301     (approximate)   History   Chest Pain (While at job site had verbal altercation with another driver and started feeling CP radiating to R arm. EKG shows Right Bundle Branch Block.  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 EMS gave 2 sprays of nitroglycerin bringing pain from 9 to 5/10  )   HPI  Derrick Gonzales is a 52 y.o. male with history of hypertension, diabetes, hyperlipidemia, obesity, tobacco use, family history of CAD who presents to the emergency department with EMS for chest pain.  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  Had associated shortness of breath, diaphoresis, dizziness.  Was given 2 nitroglycerin sprays with EMS and states symptoms improved and pain is only "slight" at this time.  Has had full dose aspirin as well.  No fevers or cough.  No lower extremity swelling or pain.   History provided by patient.    Past Medical History:  Diagnosis Date   Chest pain on exertion 12/06/2011   Diabetes mellitus    Morbid obesity with BMI of 45.0-49.9, adult (HCC) 12/06/2011    No past surgical history on file.  MEDICATIONS:  Prior to Admission medications   Medication Sig Start Date End Date Taking? Authorizing Provider  Baclofen 5 MG TABS TAKE 1 TABLET BY MOUTH AT BEDTIME AS NEEDED 09/01/22   Jerre Simon, MD  BD PEN NEEDLE MICRO U/F 32G X 6 MM MISC USE DAILY AS DIRECTED 09/01/22   Jerre Simon, MD  Continuous Glucose Sensor (DEXCOM G7 SENSOR) MISC 1 Device by Does not apply route See admin instructions. Replace sensor every 10 days Patient not taking: Reported on 12/19/2022 10/21/22   Lockie Mola, MD  empagliflozin (JARDIANCE) 25 MG TABS tablet Take 1 tablet (25 mg total) by mouth daily before breakfast. 12/19/22   Baloch, Mahnoor, MD   glucose blood test strip Use as instructed 06/22/14   Holland Commons A, NP  glucose monitoring kit (FREESTYLE) monitoring kit 1 each by Does not apply route as needed. 06/22/14   Ambrose Finland, NP  Ketoconazole 2 % GEL Apply at affected area daily for 2 weeks Patient not taking: Reported on 07/21/2022 01/15/22   Jerre Simon, MD  Lancets (FREESTYLE) lancets Use as instructed 06/22/14   Ambrose Finland, NP  LANTUS SOLOSTAR 100 UNIT/ML Solostar Pen ADMINISTER 36 UNITS UNDER THE SKIN EVERY MORNING 02/09/23   Jerre Simon, MD  lisinopril (ZESTRIL) 2.5 MG tablet Take 1 tablet (2.5 mg total) by mouth at bedtime. 12/19/22   Baloch, Mahnoor, MD  metFORMIN (GLUCOPHAGE-XR) 500 MG 24 hr tablet TAKE 1 TABLET(500 MG) BY MOUTH DAILY WITH BREAKFAST 09/01/22   Jerre Simon, MD  metFORMIN (GLUMETZA) 1000 MG (MOD) 24 hr tablet Take 1 tablet (1,000 mg total) by mouth daily with breakfast. Patient taking differently: Take 500 mg by mouth 2 (two) times daily with a meal. 06/13/22   Jerre Simon, MD  omeprazole (PRILOSEC) 40 MG capsule Take 1 capsule (40 mg total) by mouth daily. 12/19/22   Baloch, Mahnoor, MD  rosuvastatin (CRESTOR) 40 MG tablet TAKE 1 TABLET(40 MG) BY MOUTH DAILY 12/05/22   Jerre Simon, MD  Semaglutide, 1 MG/DOSE, (OZEMPIC, 1 MG/DOSE,) 4 MG/3ML SOPN Inject 1 mg into the skin once  a week. 05/05/22   Moses Manners, MD  sertraline (ZOLOFT) 50 MG tablet Take 1 tablet (50 mg total) by mouth daily. 12/19/22   Baloch, Mahnoor, MD  tadalafil (CIALIS) 10 MG tablet Take 1 tablet (10 mg total) by mouth daily as needed for up to 60 doses for erectile dysfunction. 12/19/22   Penne Lash, MD    Physical Exam   Triage Vital Signs: ED Triage Vitals  Encounter Vitals Group     BP 02/16/23 2258 123/69     Systolic BP Percentile --      Diastolic BP Percentile --      Pulse Rate 02/16/23 2256 (!) 102     Resp 02/16/23 2258 18     Temp 02/16/23 2256 98.3 F (36.8 C)     Temp Source 02/16/23 2256 Oral     SpO2  02/16/23 2258 93 %     Weight --      Height --      Head Circumference --      Peak Flow --      Pain Score 02/16/23 2256 5     Pain Loc --      Pain Education --      Exclude from Growth Chart --     Most recent vital signs: Vitals:   02/16/23 2258 02/16/23 2305  BP: 123/69 129/74  Pulse:  (!) 103  Resp: 18 15  Temp:    SpO2: 93% 93%    CONSTITUTIONAL: Alert, responds appropriately to questions. Well-appearing; well-nourished HEAD: Normocephalic, atraumatic EYES: Conjunctivae clear, pupils appear equal, sclera nonicteric ENT: normal nose; moist mucous membranes NECK: Supple, normal ROM CARD: Regular and tachycardic; S1 and S2 appreciated RESP: Normal chest excursion without splinting or tachypnea; breath sounds clear and equal bilaterally; no wheezes, no rhonchi, no rales, no hypoxia or respiratory distress, speaking full sentences ABD/GI: Non-distended; soft, non-tender, no rebound, no guarding, no peritoneal signs BACK: The back appears normal EXT: Normal ROM in all joints; no deformity noted, no edema, no calf tenderness or calf swelling SKIN: Normal color for age and race; warm; no rash on exposed skin NEURO: Moves all extremities equally, normal speech PSYCH: The patient's mood and manner are appropriate.   ED Results / Procedures / Treatments   LABS: (all labs ordered are listed, but only abnormal results are displayed) Labs Reviewed  CBC  BASIC METABOLIC PANEL  D-DIMER, QUANTITATIVE  TROPONIN I (HIGH SENSITIVITY)     EKG:  EKG Interpretation Date/Time:  Monday February 16 2023 22:59:42 EDT Ventricular Rate:  103 PR Interval:  134 QRS Duration:  142 QT Interval:  375 QTC Calculation: 491 R Axis:   45  Text Interpretation: Sinus tachycardia Right bundle branch block No significant change since last tracing Confirmed by Rochele Raring [phone]) on 02/16/2023 11:31:15 PM         RADIOLOGY: My personal review and interpretation of imaging:  ***  I  have personally reviewed all radiology reports.   No results found.   PROCEDURES:  Critical Care performed: {CriticalCareYesNo:19197::"Yes, see critical care procedure note(s)","No"}   CRITICAL CARE Performed by: Rochele Raring   Total critical care time: *** minutes  Critical care time was exclusive of separately billable procedures and treating other patients.  Critical care was necessary to treat or prevent imminent or life-threatening deterioration.  Critical care was time spent personally by me on the following activities: development of treatment plan with patient and/or surrogate as well as nursing, discussions with consultants, evaluation  of patient's response to treatment, examination of patient, obtaining history from patient or surrogate, ordering and performing treatments and interventions, ordering and review of laboratory studies, ordering and review of radiographic studies, pulse oximetry and re-evaluation of patient's condition.   Marland Kitchen1-3 Lead EKG Interpretation  Performed by: Stacyann Mcconaughy, Layla Maw, DO Authorized by: Lemmie Steinhaus, Layla Maw, DO     Interpretation: abnormal     ECG rate:  111   ECG rate assessment: tachycardic     Rhythm: sinus tachycardia     Ectopy: none     Conduction: normal       IMPRESSION / MDM / ASSESSMENT AND PLAN / ED COURSE  I reviewed the triage vital signs and the nursing notes.    Patient here with chest pain with multiple risk factors for ACS.  Also tachycardic with sats dropping as low as 91%.  The patient is on the cardiac monitor to evaluate for evidence of arrhythmia and/or significant heart rate changes.   DIFFERENTIAL DIAGNOSIS (includes but not limited to):   ACS, PE, dissection, pneumonia, CHF, pneumothorax   Patient's presentation is most consistent with acute presentation with potential threat to life or bodily function.   PLAN: Will obtain cardiac labs, D-dimer, chest x-ray.  EKG shows right bundle branch block which is old  compared to previous.  Will give another nitroglycerin tablet as he is almost completely chest pain-free.   MEDICATIONS GIVEN IN ED: Medications  nitroGLYCERIN (NITROSTAT) SL tablet 0.4 mg (0.4 mg Sublingual Given 02/16/23 2319)     ED COURSE:  ***   CONSULTS:  ***   OUTSIDE RECORDS REVIEWED: Reviewed last cardiology note with Dr. Gwen Pounds in 2017.  Patient had a negative stress test at that time.       FINAL CLINICAL IMPRESSION(S) / ED DIAGNOSES   Final diagnoses:  Nonspecific chest pain     Rx / DC Orders   ED Discharge Orders     None        Note:  This document was prepared using Dragon voice recognition software and may include unintentional dictation errors.

## 2023-02-17 DIAGNOSIS — R079 Chest pain, unspecified: Secondary | ICD-10-CM

## 2023-02-17 LAB — LIPID PANEL
Cholesterol: 164 mg/dL (ref 0–200)
HDL: 45 mg/dL (ref >40)
LDL Cholesterol: 93 mg/dL (ref 0–99)
Total CHOL/HDL Ratio: 3.6 {ratio}
Triglycerides: 131 mg/dL (ref <150)
VLDL: 26 mg/dL (ref 0–40)

## 2023-02-17 LAB — HEMOGLOBIN A1C
Hgb A1c MFr Bld: 9.9 % — ABNORMAL HIGH (ref 4.8–5.6)
Mean Plasma Glucose: 237.43 mg/dL

## 2023-02-17 LAB — HEPATIC FUNCTION PANEL
ALT: 20 U/L (ref 0–44)
AST: 15 U/L (ref 15–41)
Albumin: 3.8 g/dL (ref 3.5–5.0)
Alkaline Phosphatase: 52 U/L (ref 38–126)
Bilirubin, Direct: 0.1 mg/dL (ref 0.0–0.2)
Indirect Bilirubin: 0.7 mg/dL (ref 0.3–0.9)
Total Bilirubin: 0.8 mg/dL (ref 0.3–1.2)
Total Protein: 7.4 g/dL (ref 6.5–8.1)

## 2023-02-17 LAB — TROPONIN I (HIGH SENSITIVITY): Troponin I (High Sensitivity): 7 ng/L (ref <18)

## 2023-02-17 LAB — TSH: TSH: 2.909 u[IU]/mL (ref 0.350–4.500)

## 2023-02-17 LAB — T4, FREE: Free T4: 1.02 ng/dL (ref 0.61–1.12)

## 2023-02-17 MED ORDER — ONDANSETRON HCL 4 MG/2ML IJ SOLN: 4.0000 mg | Freq: Four times a day (QID) | INTRAMUSCULAR | Status: DC | PRN

## 2023-02-17 MED ORDER — ACETAMINOPHEN 325 MG PO TABS: 650.0000 mg | ORAL_TABLET | ORAL | Status: DC | PRN

## 2023-02-17 MED ORDER — HEPARIN SODIUM (PORCINE) 5000 UNIT/ML IJ SOLN: 5000.0000 [IU] | Freq: Three times a day (TID) | INTRAMUSCULAR | Status: DC

## 2023-02-17 MED ORDER — INSULIN ASPART 100 UNIT/ML IJ SOLN: 0.0000 [IU] | INTRAMUSCULAR | Status: DC | PRN

## 2023-02-17 MED ORDER — ASPIRIN 81 MG PO TBEC: 81.0000 mg | DELAYED_RELEASE_TABLET | Freq: Every day | ORAL | Status: DC

## 2023-02-17 MED ORDER — LACTATED RINGERS IV SOLN: INTRAVENOUS | Status: DC

## 2023-02-17 MED ORDER — NICOTINE 21 MG/24HR TD PT24: 21.0000 mg | MEDICATED_PATCH | Freq: Every day | TRANSDERMAL | Status: DC

## 2023-02-17 NOTE — Assessment & Plan Note (Signed)
2/2 to CAD VS GI related.  We will follow and consult cardiology.

## 2023-02-17 NOTE — Assessment & Plan Note (Signed)
Nicotine patch/ counseling once stable.

## 2023-02-17 NOTE — H&P (Signed)
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 EKG shows Right Bundle Branch Block.  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 EMS gave 2 sprays of nitroglycerin bringing pain from 9 to 5/10      HPI: Derrick Gonzales is a 52 y.o. male with medical history significant for Hypertension, diabetes mellitus type 2, hyperlipidemia, obesity, tobacco abuse, history of MI 2 years ago,, stressed to the emergency room today with complaints of chest discomfort radiating to his right arm under stressful situation at work where patient had argument with another driver and the chest pain started. He was upset when chest pain started. EKG shows a history of right bundle branch block.  Patient was given nitroglycerin by EMS which did help the pain.  Patient reports associated shortness of breath diaphoresis dizziness.  Patient also given full dose aspirin.  Initial vitals show blood pressure 123/69 heart rate of 102 respiration rate of 18 O2 sats of 93 and temperature 98.3.  EKG shows sinus tach at 103 right bundle branch block PR interval 134 QRS 142 QTc 491, In the emergency room patient did get sublingual nitro.  Initial metabolic panel showed a glucose of 224 electrolytes normal kidney function normal LFTs to be added on.  CBC within normal limits, D-dimer 0.27.  Chest x-ray shows no acute abnormality.  Chart review shows most recent echocardiogram in 2013 showed an ejection fraction of 60 to 65%.   Review of Systems: Review of Systems  Constitutional:  Positive for diaphoresis.  Respiratory:  Positive for shortness of breath.   Cardiovascular:  Positive for chest pain and palpitations.   Past  Medical History:  Diagnosis Date   Chest pain on exertion 12/06/2011   Diabetes mellitus    Morbid obesity with BMI of 45.0-49.9, adult (HCC) 12/06/2011   No past surgical history on file. Social History:   reports that he has been smoking cigarettes. He has never used smokeless tobacco. He reports that he does not drink alcohol and does not use drugs.  Allergies  Allergen Reactions   Atorvastatin Other (See Comments)    Migraine Headache    Family History  Problem Relation Age of Onset   Coronary artery disease Father    Heart disease Father    Diabetes Father    Hypertension Mother    Coronary artery disease Other     Prior to Admission medications   Medication Sig Start Date End Date Taking? Authorizing Provider  Baclofen 5 MG TABS TAKE 1 TABLET BY MOUTH AT BEDTIME AS NEEDED 09/01/22   Jerre Simon, MD  BD PEN NEEDLE MICRO U/F 32G X 6 MM MISC USE DAILY AS DIRECTED 09/01/22   Jerre Simon, MD  Continuous Glucose Sensor (DEXCOM G7 SENSOR) MISC 1 Device by Does not apply route See admin instructions. Replace sensor every 10 days Patient not taking: Reported on 12/19/2022 10/21/22   Lockie Mola, MD  empagliflozin (JARDIANCE) 25 MG TABS tablet Take 1 tablet (25 mg total) by mouth daily before breakfast. 12/19/22   Baloch, Mahnoor, MD  glucose blood test strip Use as instructed 06/22/14   Ambrose Finland, NP  glucose monitoring kit (  FREESTYLE) monitoring kit 1 each by Does not apply route as needed. 06/22/14   Ambrose Finland, NP  Ketoconazole 2 % GEL Apply at affected area daily for 2 weeks Patient not taking: Reported on 07/21/2022 01/15/22   Jerre Simon, MD  Lancets (FREESTYLE) lancets Use as instructed 06/22/14   Ambrose Finland, NP  LANTUS SOLOSTAR 100 UNIT/ML Solostar Pen ADMINISTER 36 UNITS UNDER THE SKIN EVERY MORNING 02/09/23   Jerre Simon, MD  lisinopril (ZESTRIL) 2.5 MG tablet Take 1 tablet (2.5 mg total) by mouth at bedtime. 12/19/22   Baloch, Mahnoor, MD  metFORMIN (GLUCOPHAGE-XR)  500 MG 24 hr tablet TAKE 1 TABLET(500 MG) BY MOUTH DAILY WITH BREAKFAST 09/01/22   Jerre Simon, MD  metFORMIN (GLUMETZA) 1000 MG (MOD) 24 hr tablet Take 1 tablet (1,000 mg total) by mouth daily with breakfast. Patient taking differently: Take 500 mg by mouth 2 (two) times daily with a meal. 06/13/22   Jerre Simon, MD  omeprazole (PRILOSEC) 40 MG capsule Take 1 capsule (40 mg total) by mouth daily. 12/19/22   Baloch, Mahnoor, MD  rosuvastatin (CRESTOR) 40 MG tablet TAKE 1 TABLET(40 MG) BY MOUTH DAILY 12/05/22   Jerre Simon, MD  Semaglutide, 1 MG/DOSE, (OZEMPIC, 1 MG/DOSE,) 4 MG/3ML SOPN Inject 1 mg into the skin once a week. 05/05/22   Moses Manners, MD  sertraline (ZOLOFT) 50 MG tablet Take 1 tablet (50 mg total) by mouth daily. 12/19/22   Baloch, Mahnoor, MD  tadalafil (CIALIS) 10 MG tablet Take 1 tablet (10 mg total) by mouth daily as needed for up to 60 doses for erectile dysfunction. 12/19/22   Baloch, Mahnoor, MD   Vitals:   02/16/23 2256 02/16/23 2258 02/16/23 2305  BP:  123/69 129/74  Pulse: (!) 102  (!) 103  Resp:  18 15  Temp: 98.3 F (36.8 C)    TempSrc: Oral    SpO2:  93% 93%   Physical Exam Vitals and nursing note reviewed.  Constitutional:      General: He is not in acute distress. HENT:     Head: Normocephalic and atraumatic.     Right Ear: Hearing normal.     Left Ear: Hearing normal.     Nose: Nose normal. No nasal deformity.     Mouth/Throat:     Lips: Pink.     Tongue: No lesions.     Pharynx: Oropharynx is clear.  Eyes:     General: Lids are normal.     Extraocular Movements: Extraocular movements intact.  Cardiovascular:     Rate and Rhythm: Normal rate and regular rhythm.     Heart sounds: Normal heart sounds.  Pulmonary:     Effort: Pulmonary effort is normal.     Breath sounds: Normal breath sounds.  Abdominal:     General: Bowel sounds are normal. There is no distension.     Palpations: Abdomen is soft. There is no mass.     Tenderness: There is no  abdominal tenderness.  Musculoskeletal:     Right lower leg: No edema.     Left lower leg: No edema.  Skin:    General: Skin is warm.  Neurological:     General: No focal deficit present.     Mental Status: He is alert and oriented to person, place, and time.     Cranial Nerves: Cranial nerves 2-12 are intact.  Psychiatric:        Attention and Perception: Attention normal.  Mood and Affect: Mood normal.        Speech: Speech normal.        Behavior: Behavior normal. Behavior is cooperative.   Labs on Admission: I have personally reviewed following labs and imaging studies  CBC: Recent Labs  Lab 02/16/23 2300  WBC 10.2  HGB 14.9  HCT 44.4  MCV 89.0  PLT 212   Basic Metabolic Panel: Recent Labs  Lab 02/16/23 2300  NA 139  K 3.6  CL 105  CO2 24  GLUCOSE 224*  BUN 20  CREATININE 0.69  CALCIUM 8.9   GFR: CrCl cannot be calculated (Unknown ideal weight.). Liver Function Tests: No results for input(s): "AST", "ALT", "ALKPHOS", "BILITOT", "PROT", "ALBUMIN" in the last 168 hours. No results for input(s): "LIPASE", "AMYLASE" in the last 168 hours. No results for input(s): "AMMONIA" in the last 168 hours. Coagulation Profile: No results for input(s): "INR", "PROTIME" in the last 168 hours. Cardiac Enzymes: No results for input(s): "CKTOTAL", "CKMB", "CKMBINDEX", "TROPONINI" in the last 168 hours. BNP (last 3 results) No results for input(s): "PROBNP" in the last 8760 hours. HbA1C: No results for input(s): "HGBA1C" in the last 72 hours. CBG: No results for input(s): "GLUCAP" in the last 168 hours. Lipid Profile: No results for input(s): "CHOL", "HDL", "LDLCALC", "TRIG", "CHOLHDL", "LDLDIRECT" in the last 72 hours. Thyroid Function Tests: No results for input(s): "TSH", "T4TOTAL", "FREET4", "T3FREE", "THYROIDAB" in the last 72 hours. Anemia Panel: No results for input(s): "VITAMINB12", "FOLATE", "FERRITIN", "TIBC", "IRON", "RETICCTPCT" in the last 72  hours. Urinalysis    Component Value Date/Time   COLORURINE YELLOW (A) 07/17/2022 1519   APPEARANCEUR CLEAR (A) 07/17/2022 1519   APPEARANCEUR Clear 09/23/2013 0827   LABSPEC 1.039 (H) 07/17/2022 1519   LABSPEC 1.020 09/23/2013 0827   PHURINE 5.0 07/17/2022 1519   GLUCOSEU >=500 (A) 07/17/2022 1519   GLUCOSEU 100 mg/dL 16/02/9603 5409   HGBUR NEGATIVE 07/17/2022 1519   BILIRUBINUR NEGATIVE 07/17/2022 1519   BILIRUBINUR negative 12/06/2019 1115   BILIRUBINUR neg 06/22/2014 1759   BILIRUBINUR Negative 09/23/2013 0827   KETONESUR NEGATIVE 07/17/2022 1519   PROTEINUR NEGATIVE 07/17/2022 1519   UROBILINOGEN 0.2 12/06/2019 1115   NITRITE NEGATIVE 07/17/2022 1519   LEUKOCYTESUR NEGATIVE 07/17/2022 1519   LEUKOCYTESUR Negative 09/23/2013 0827   Unresulted Labs (From admission, onward)     Start     Ordered   02/17/23 0101  Lipid panel  Add-on,   AD        02/17/23 0100   02/17/23 0101  T4, free  Add-on,   AD        02/17/23 0100   02/17/23 0101  TSH  Add-on,   AD        02/17/23 0100   02/17/23 0053  Hemoglobin A1c  Add-on,   AD       Comments: To assess prior glycemic control    02/17/23 0056   02/17/23 0053  HIV Antibody (routine testing w rflx)  (HIV Antibody (Routine testing w reflex) panel)  Once,   R        02/17/23 0056   02/17/23 0044  Hepatic function panel  Add-on,   AD        02/17/23 0056            Medications  insulin aspart (novoLOG) injection 0-15 Units (has no administration in time range)  acetaminophen (TYLENOL) tablet 650 mg (has no administration in time range)  ondansetron (ZOFRAN) injection 4 mg (has no administration  in time range)  heparin injection 5,000 Units (has no administration in time range)  aspirin EC tablet 81 mg (has no administration in time range)  lactated ringers infusion (has no administration in time range)  nicotine (NICODERM CQ - dosed in mg/24 hours) patch 21 mg (has no administration in time range)  nitroGLYCERIN  (NITROSTAT) SL tablet 0.4 mg (0.4 mg Sublingual Given 02/16/23 2319)    Radiological Exams on Admission: DG Chest Portable 1 View  Result Date: 02/17/2023 CLINICAL DATA:  Chest pain and shortness of breath for 1 day EXAM: PORTABLE CHEST 1 VIEW COMPARISON:  10/02/2021 FINDINGS: Cardiac shadow is stable. Lungs are well aerated bilaterally. No focal infiltrate is noted. Postsurgical changes in the left chest wall are noted. IMPRESSION: No acute abnormality noted. Electronically Signed   By: Alcide Clever M.D.   On: 02/17/2023 00:31     Data Reviewed: Relevant notes from primary care and specialist visits, past discharge summaries as available in EHR, including Care Everywhere. Prior diagnostic testing as pertinent to current admission diagnoses Updated medications and problem lists for reconciliation ED course, including vitals, labs, imaging, treatment and response to treatment Triage notes, nursing and pharmacy notes and ED provider's notes Notable results as noted in HPI  Assessment and Plan: * Chest pain on exertion Suspect UA. TNI negative so far.  Cardiology consulted for ischemic eval. PRN NTG. PRN morphine.  Aspirin continued.  Statin allergy. IV ppi .  CAD (coronary artery disease) H/O CAD cont asa/ and cardiology consult for stress test.  NPO except for sip with meds.  Prn EKG.  Dimer negative.   DM2 (diabetes mellitus, type 2) (HCC) Hold jardiance/ Lantus. Cont with SSI .  Tobacco abuse Nicotine patch/ counseling once stable.   GERD without esophagitis IV PPI.   Morbid obesity with BMI of 45.0-49.9, adult (HCC) Free t4/ TSH. Dietitian consult fro nutrition counseling.   MDD (major depressive disorder) Continue Zoloft.   Chest pain 2/2 to CAD VS GI related.  We will follow and consult cardiology.     DVT prophylaxis:  Heparin   Consults:  Cardiology: [facility] pool   Advance Care Planning:    Code Status: Full Code   Family Communication:  None    Disposition Plan:  Home   Severity of Illness: The appropriate patient status for this patient is OBSERVATION. Observation status is judged to be reasonable and necessary in order to provide the required intensity of service to ensure the patient's safety. The patient's presenting symptoms, physical exam findings, and initial radiographic and laboratory data in the context of their medical condition is felt to place them at decreased risk for further clinical deterioration. Furthermore, it is anticipated that the patient will be medically stable for discharge from the hospital within 2 midnights of admission.   Author: Gertha Calkin, MD 02/17/2023 1:05 AM  For on call review [email or web address].

## 2023-02-17 NOTE — Discharge Instructions (Signed)
Your cardiac enzymes today were normal but you have significant risk factors for cardiac chest pain and we have recommended admission to the hospital.  You have decided to leave AGAINST MEDICAL ADVICE.  We cannot rule out life-threatening illness and you could be at risk for worsening symptoms, severe and permanent disability and even death.  Please return to the hospital at any time if you have return of symptoms or change your mind.  I have placed a referral for cardiology.  I recommend close follow-up with your PCP.

## 2023-02-17 NOTE — Assessment & Plan Note (Signed)
IV PPI. 

## 2023-02-17 NOTE — ED Notes (Signed)
Patient expressed a desire to leave.  He says he feels better, and will follow up with his PCP.  Dr. Elesa Massed was advised, and she came to speak to the patient.  He expressed his desire to leave, and he was given all of the reasons he should stay, and the risks involved in leaving.  The patient still wanted to leave, but agreed to stay for the results of his followup troponin test.

## 2023-02-17 NOTE — Assessment & Plan Note (Signed)
Suspect UA. TNI negative so far.  Cardiology consulted for ischemic eval. PRN NTG. PRN morphine.  Aspirin continued.  Statin allergy. IV ppi .

## 2023-02-17 NOTE — Assessment & Plan Note (Signed)
Hold jardiance/ Lantus. Cont with SSI .

## 2023-02-17 NOTE — Assessment & Plan Note (Signed)
Continue Zoloft 

## 2023-02-17 NOTE — Assessment & Plan Note (Signed)
Free t4/ TSH. Dietitian consult fro nutrition counseling.

## 2023-02-17 NOTE — Significant Event (Signed)
ADMISSION CONSULT CANCELLED PT IS LEAVING AMA.  NOTE IS LEFT IN CHART IN EVENT PT COMES BACK.

## 2023-02-17 NOTE — Assessment & Plan Note (Signed)
H/O CAD cont asa/ and cardiology consult for stress test.  NPO except for sip with meds.  Prn EKG.  Dimer negative.

## 2023-02-18 ENCOUNTER — Telehealth: Payer: Self-pay

## 2023-02-18 NOTE — Transitions of Care (Post Inpatient/ED Visit) (Unsigned)
   02/18/2023  Name: Derrick Gonzales MRN: [id number] DOB: 05/05/1971  Today's TOC FU Call Status: Today's TOC FU Call Status:: Unsuccessful Call (1st Attempt) Unsuccessful Call (1st Attempt) Date: 02/18/23  Attempted to reach the patient regarding the most recent Inpatient/ED visit.  Follow Up Plan: Additional outreach attempts will be made to reach the patient to complete the Transitions of Care (Post Inpatient/ED visit) call.   Signature Karena Addison, LPN [facility] Nurse Health Advisor Direct Dial [phone]

## 2023-02-19 NOTE — Transitions of Care (Post Inpatient/ED Visit) (Signed)
02/19/2023  Name: Derrick Gonzales MRN: [id number] DOB: Jun 13, 1970  Today's TOC FU Call Status: Today's TOC FU Call Status:: Successful TOC FU Call Completed Unsuccessful Call (1st Attempt) Date: 02/18/23 [facility] FU Call Complete Date: 02/19/23 Patient's Name and Date of Birth confirmed.  Transition Care Management Follow-up Telephone Call Date of Discharge: 02/17/23 Discharge Facility: [facility]) Type of Discharge: Inpatient Admission Primary Inpatient Discharge Diagnosis:: chest pain How have you been since you were released from the hospital?: Better Any questions or concerns?: No  Items Reviewed: Did you receive and understand the discharge instructions provided?: Yes Medications obtained,verified, and reconciled?: Yes (Medications Reviewed) Any new allergies since your discharge?: No Dietary orders reviewed?: Yes Do you have support at home?: Yes People in Home: significant other  Medications Reviewed Today: Medications Reviewed Today     Reviewed by Karena Addison, LPN (Licensed Practical Nurse) on 02/19/23 at 1530  Med List Status: <None>   Medication Order Taking? Sig Documenting Provider Last Dose Status Informant  Baclofen 5 MG TABS [id number] No TAKE 1 TABLET BY MOUTH AT BEDTIME AS NEEDED Jerre Simon, MD Taking Active   BD PEN NEEDLE MICRO U/F 32G X 6 MM MISC [id number]  USE DAILY AS DIRECTED Jerre Simon, MD  Active   Continuous Glucose Sensor (DEXCOM G7 SENSOR) MISC [id number] No 1 Device by Does not apply route See admin instructions. Replace sensor every 10 days  Patient not taking: Reported on 12/19/2022   Lockie Mola, MD Not Taking Active   empagliflozin (JARDIANCE) 25 MG TABS tablet [id number]  Take 1 tablet (25 mg total) by mouth daily before breakfast. Georg Ruddle, Mahnoor, MD  Active   glucose blood test strip [id number] No Use as instructed Ambrose Finland, NP Taking Active Self  glucose monitoring kit (FREESTYLE) monitoring kit  [id number] No 1 each by Does not apply route as needed. Ambrose Finland, NP Taking Active Self  Ketoconazole 2 % GEL [id number] No Apply at affected area daily for 2 weeks  Patient not taking: Reported on 07/21/2022   Jerre Simon, MD Not Taking Active   Lancets (FREESTYLE) lancets [id number] No Use as instructed Ambrose Finland, NP Taking Active Self  LANTUS SOLOSTAR 100 UNIT/ML Solostar Pen [id number]  ADMINISTER 36 UNITS UNDER THE SKIN EVERY Madie Reno, MD  Active   lisinopril (ZESTRIL) 2.5 MG tablet [id number]  Take 1 tablet (2.5 mg total) by mouth at bedtime. Georg Ruddle, Mahnoor, MD  Active   metFORMIN (GLUCOPHAGE-XR) 500 MG 24 hr tablet [id number]  TAKE 1 TABLET(500 MG) BY MOUTH DAILY WITH Janne Napoleon, MD  Active   metFORMIN (GLUMETZA) 1000 MG (MOD) 24 hr tablet [id number] No Take 1 tablet (1,000 mg total) by mouth daily with breakfast.  Patient taking differently: Take 500 mg by mouth 2 (two) times daily with a meal.   Jerre Simon, MD Taking Active   omeprazole (PRILOSEC) 40 MG capsule [id number]  Take 1 capsule (40 mg total) by mouth daily. Baloch, Mahnoor, MD  Active   rosuvastatin (CRESTOR) 40 MG tablet [id number]  TAKE 1 TABLET(40 MG) BY MOUTH DAILY Jerre Simon, MD  Active   Semaglutide, 1 MG/DOSE, (OZEMPIC, 1 MG/DOSE,) 4 MG/3ML SOPN [id number] No Inject 1 mg into the skin once a week. Moses Manners, MD Taking Active   sertraline (ZOLOFT) 50 MG tablet [id number]  Take 1 tablet (50 mg total) by mouth daily. Baloch, Mahnoor, MD  Active   tadalafil (CIALIS) 10 MG tablet [id number]  Take 1  tablet (10 mg total) by mouth daily as needed for up to 60 doses for erectile dysfunction. Georg Ruddle Mahnoor, MD  Active             Home Care and Equipment/Supplies: Were Home Health Services Ordered?: NA Any new equipment or medical supplies ordered?: NA  Functional Questionnaire: Do you need assistance with bathing/showering or dressing?: No Do you need assistance with meal  preparation?: No Do you need assistance with eating?: No Do you have difficulty maintaining continence: No Do you need assistance with getting out of bed/getting out of a chair/moving?: No Do you have difficulty managing or taking your medications?: No  Follow up appointments reviewed: PCP Follow-up appointment confirmed?: Yes Date of PCP follow-up appointment?: 02/25/23 Follow-up Provider: [facility] Follow-up appointment confirmed?: NA Do you need transportation to your follow-up appointment?: No Do you understand care options if your condition(s) worsen?: Yes-patient verbalized understanding    SIGNATURE Karena Addison, LPN [facility] Nurse Health Advisor Direct Dial [phone]

## 2023-02-20 ENCOUNTER — Other Ambulatory Visit: Payer: Self-pay

## 2023-02-20 DIAGNOSIS — E1165 Type 2 diabetes mellitus with hyperglycemia: Secondary | ICD-10-CM

## 2023-02-21 MED ORDER — OZEMPIC (1 MG/DOSE) 4 MG/3ML ~~LOC~~ SOPN
1.0000 mg | PEN_INJECTOR | SUBCUTANEOUS | 3 refills | Status: AC
Start: 2023-02-21 — End: ?

## 2023-02-25 ENCOUNTER — Ambulatory Visit: Payer: Medicaid Other | Admitting: Student

## 2023-02-26 ENCOUNTER — Encounter: Payer: Self-pay | Admitting: Student

## 2023-02-26 ENCOUNTER — Ambulatory Visit (INDEPENDENT_AMBULATORY_CARE_PROVIDER_SITE_OTHER): Payer: Medicaid Other | Admitting: Student

## 2023-02-26 VITALS — BP 154/82 | HR 84 | Ht 64.0 in | Wt 269.6 lb

## 2023-02-26 DIAGNOSIS — R079 Chest pain, unspecified: Secondary | ICD-10-CM | POA: Diagnosis present

## 2023-02-26 NOTE — Assessment & Plan Note (Signed)
Poorly controlled diabetes with worsening A1c send recently of 9.9.  Nuys any hypoglycemic episode inconsistent with home monitoring of CBGs which he reported mostly from the 170s-250s. Current medication includes Lantus 64 units daily, Ozempic 1 mg weekly and Jardiance.  Patient endorses compliance and good tolerance.  Discussed medical changes to patient's current treatment which includes switching Lantus to 30 units twice daily.  Patient verbalized understanding and was agreeable to plan.  Emphasized need for dieting and exercising.
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
## 2023-02-26 NOTE — Patient Instructions (Signed)
It was wonderful to see you today. Thank you for allowing me to be a part of your care. Below is a short summary of what we discussed at your visit today:  Glad to hear you are no longer having the chest pain.  As discussed I think you still benefit from seeing a cardiologist to rule out possible heart disease.  Have contacted them to reach back out to you again to try and schedule an appointment.  I also see your A1c has worsened from your last results from the ED.  I think you could benefit from splitting your insulin.  I recommend splitting your insulin to 30 units 2 times daily 1(morning and evening).  Have also provided you a letter to return to work.   Please bring all of your medications to every appointment!  If you have any questions or concerns, please do not hesitate to contact us via phone or MyChart message.   Jerre Simon, MD [facility]
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
## 2023-03-02 ENCOUNTER — Other Ambulatory Visit: Payer: Self-pay | Admitting: Student

## 2023-03-02 DIAGNOSIS — E1165 Type 2 diabetes mellitus with hyperglycemia: Secondary | ICD-10-CM

## 2023-03-06 ENCOUNTER — Ambulatory Visit: Payer: No Typology Code available for payment source

## 2023-03-13 ENCOUNTER — Ambulatory Visit (INDEPENDENT_AMBULATORY_CARE_PROVIDER_SITE_OTHER): Payer: Medicaid Other | Admitting: Family Medicine

## 2023-03-13 ENCOUNTER — Ambulatory Visit: Payer: No Typology Code available for payment source

## 2023-03-13 VITALS — BP 138/87 | HR 83 | Temp 98.1°F | Ht 64.0 in | Wt 271.8 lb

## 2023-03-13 DIAGNOSIS — R059 Cough, unspecified: Secondary | ICD-10-CM

## 2023-03-13 DIAGNOSIS — B349 Viral infection, unspecified: Secondary | ICD-10-CM | POA: Diagnosis present

## 2023-03-13 LAB — POC SOFIA 2 FLU + SARS ANTIGEN FIA
Influenza A, POC: NEGATIVE
Influenza B, POC: NEGATIVE
SARS Coronavirus 2 Ag: NEGATIVE

## 2023-03-13 NOTE — Progress Notes (Signed)
    SUBJECTIVE:   CHIEF COMPLAINT / HPI:   Flu like symptoms Since Monday 10/21 Cough, cold, runny nose. Body aches. Fever to 102F on Monday but no fever today Has been using over-the-counter medicine such as Tylenol and TheraFlu Had 1 or 2 episodes of diarrhea and throwing up phlegm Son has also been sick recently Denies any shortness of breath or chest pain, abdominal pain  PERTINENT  PMH / PSH: CAD, T2DM, tobacco use  OBJECTIVE:   BP 138/87   Pulse 83   Temp 98.1 F (36.7 C)   Ht 5\' 4"  (1.626 m)   Wt 271 lb 12.8 oz (123.3 kg)   SpO2 99%   BMI 46.65 kg/m   General: NAD, pleasant, able to participate in exam HEENT: Cobblestoning present in posterior oropharynx, no significant tonsillar exudate or swelling.  No oral lesions identified.  Sinuses are nontender to palpation. Cardiac: RRR, no murmurs auscultated Respiratory: CTAB, normal WOB Abdomen: soft, non-tender, non-distended, normoactive bowel sounds Extremities: warm and well perfused, no edema or cyanosis Skin: warm and dry, no rashes noted Neuro: alert, no obvious focal deficits, speech normal Psych: Normal affect and mood   ASSESSMENT/PLAN:   Assessment & Plan Viral illness COVID/flu negative.  Reassured that he is afebrile today and his symptoms appear to be improving.  Exam is benign.  Provided work note.  Discussed return precautions and continued supportive care   Vonna Drafts, MD [facility]

## 2023-03-18 ENCOUNTER — Ambulatory Visit: Payer: Medicaid Other

## 2023-03-18 VITALS — BP 140/80 | HR 86 | Resp 12 | Ht 64.0 in | Wt 267.6 lb

## 2023-03-18 DIAGNOSIS — I10 Essential (primary) hypertension: Secondary | ICD-10-CM

## 2023-03-18 DIAGNOSIS — Z720 Tobacco use: Secondary | ICD-10-CM

## 2023-03-18 DIAGNOSIS — E7800 Pure hypercholesterolemia, unspecified: Secondary | ICD-10-CM | POA: Diagnosis not present

## 2023-03-18 DIAGNOSIS — R079 Chest pain, unspecified: Secondary | ICD-10-CM

## 2023-03-18 MED ORDER — LISINOPRIL 5 MG PO TABS: 5.0000 mg | ORAL_TABLET | Freq: Every day | ORAL | 3 refills | Status: DC

## 2023-03-18 MED ORDER — METOPROLOL TARTRATE 25 MG PO TABS: 25.0000 mg | ORAL_TABLET | Freq: Two times a day (BID) | ORAL | 3 refills | Status: DC

## 2023-03-18 MED ORDER — EZETIMIBE 10 MG PO TABS: 10.0000 mg | ORAL_TABLET | Freq: Every day | ORAL | 3 refills | Status: DC

## 2023-03-18 NOTE — Assessment & Plan Note (Signed)
Significant symptoms.  Significant risk factors. Last stress echocardiogram with dobutamine in July 2023 was reportedly unremarkable. With ongoing symptoms at this time, Will further assess for any underlying coronary artery disease  Will proceed with CT coronary angiogram, optimize heart rates with metoprolol tartrate 100 mg on the morning of the test.
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
## 2023-03-18 NOTE — Assessment & Plan Note (Signed)
Advised father to quit smoking. Suggested alternative for nicotine patch, nicotine gum if he is interested. He is going to work on it and will inform us or his PCP if interested.

## 2023-03-18 NOTE — Assessment & Plan Note (Signed)
Recommend targeted LDL levels below 70 mg/dL. Continue Crestor 40 mg once daily Add Zetia 10 mg once daily.

## 2023-03-18 NOTE — Assessment & Plan Note (Signed)
Target blood pressure below 130 over 80 mmHg and if possible further below 120 over 80 mmHg.  Titrate up the dose of lisinopril to 5 mg once daily Add metoprolol tartrate 25 mg twice daily.

## 2023-03-18 NOTE — Patient Instructions (Signed)
Medication Instructions:  Your physician has recommended you make the following change in your medication:   START: Lisinopril 5 mg daily START: Metoprolol tartrate 25 mg two times daily START: Zetia 10 mg daily   *If you need a refill on your cardiac medications before your next appointment, please call your pharmacy*   Lab Work: Your physician recommends that you return for lab work in:   Labs today: BMP  If you have labs (blood work) drawn today and your tests are completely normal, you will receive your results only by: MyChart Message (if you have MyChart) OR A paper copy in the mail If you have any lab test that is abnormal or we need to change your treatment, we will call you to review the results.   Testing/Procedures: Your physician has requested that you have an echocardiogram. Echocardiography is a painless test that uses sound waves to create images of your heart. It provides your doctor with information about the size and shape of your heart and how well your heart's chambers and valves are working. This procedure takes approximately one hour. There are no restrictions for this procedure. Please do NOT wear cologne, perfume, aftershave, or lotions (deodorant is allowed). Please arrive 15 minutes prior to your appointment time.    Your cardiac CT will be scheduled at one of the below locations:   [facility] [address] [place], [place] [address] [phone]  OR  [facility] [address] [place], [place] [address] [phone]  OR   [facility] [address] [place], [place] [address] [phone]  If scheduled at [facility], please arrive at the [facility] and Children's Entrance (Entrance C2) of [facility] 30 minutes prior to test start time. You can use the FREE valet parking offered at entrance C (encouraged to control the heart rate for the test)   Proceed to the [facility] Radiology Department (first floor) to check-in and test prep.  All radiology patients and guests should use entrance C2 at [facility], accessed from [facility], even though the hospital's physical address listed is [address].    If scheduled at [facility] or [facility], please arrive 15 mins early for check-in and test prep.  There is spacious parking and easy access to the radiology department from the [facility] entrance. Please enter here and check-in with the desk attendant.   Please follow these instructions carefully (unless otherwise directed):  An IV will be required for this test and Nitroglycerin will be given.  Hold all erectile dysfunction medications at least 3 days (72 hrs) prior to test. (Ie viagra, cialis, sildenafil, tadalafil, etc)   On the Night Before the Test: Be sure to Drink plenty of water. Do not consume any caffeinated/decaffeinated beverages or chocolate 12 hours prior to your test. Do not take any antihistamines 12 hours prior to your test.  On the Day of the Test: Drink plenty of water until 1 hour prior to the test. Do not eat any food 1 hour prior to test. You may take your regular medications prior to the test.  Take metoprolol tartrate 100 mg the morning of your test.       After the Test: Drink plenty of water. After receiving IV contrast, you may experience a mild flushed feeling. This is normal. On occasion, you may experience a mild rash up to  24 hours after the test. This is not dangerous. If this occurs, you can take Benadryl 25 mg and increase your fluid intake. If you experience trouble breathing, this can be serious. If it is severe call 911 IMMEDIATELY. If it is mild, please call our office. If you take any of these medications: Glipizide/Metformin, Avandament, Glucavance, please do not take 48 hours after completing  test unless otherwise instructed.  We will call to schedule your test 2-4 weeks out understanding that some insurance companies will need an authorization prior to the service being performed.   For more information and frequently asked questions, please visit our website : [email or web address]  For non-scheduling related questions, please contact the cardiac imaging nurse navigator should you have any questions/concerns: Cardiac Imaging Nurse Navigators Direct Office Dial: [phone]   For scheduling needs, including cancellations and rescheduling, please call [place], [phone].    Follow-Up: At [facility], you and your health needs are our priority.  As part of our continuing mission to provide you with exceptional heart care, we have created designated Provider Care Teams.  These Care Teams include your primary Cardiologist (physician) and Advanced Practice Providers (APPs -  Physician Assistants and Nurse Practitioners) who all work together to provide you with the care you need, when you need it.  We recommend signing up for the patient portal called "MyChart".  Sign up information is provided on this After Visit Summary.  MyChart is used to connect with patients for Virtual Visits (Telemedicine).  Patients are able to view lab/test results, encounter notes, upcoming appointments, etc.  Non-urgent messages can be sent to your provider as well.   To learn more about what you can do with MyChart, go to [email or web address].    Your next appointment:   1 month(s)  Provider:   Huntley Dec, MD    Other Instructions None

## 2023-03-27 ENCOUNTER — Other Ambulatory Visit: Payer: Self-pay | Admitting: Student

## 2023-03-27 DIAGNOSIS — E1165 Type 2 diabetes mellitus with hyperglycemia: Secondary | ICD-10-CM

## 2023-03-31 ENCOUNTER — Encounter (HOSPITAL_COMMUNITY): Payer: Self-pay

## 2023-04-02 ENCOUNTER — Ambulatory Visit (HOSPITAL_COMMUNITY): Payer: Medicaid Other

## 2023-04-23 ENCOUNTER — Ambulatory Visit: Payer: Medicaid Other

## 2023-04-28 ENCOUNTER — Ambulatory Visit: Payer: Medicaid Other

## 2023-04-30 ENCOUNTER — Telehealth (HOSPITAL_COMMUNITY): Payer: Self-pay | Admitting: *Deleted

## 2023-04-30 NOTE — Telephone Encounter (Signed)
Reaching out to patient to offer assistance regarding upcoming cardiac imaging study; pt verbalizes understanding of appt date/time, parking situation and where to check in, pre-test NPO status and medications ordered, and verified current allergies; name and call back number provided for further questions should they arise  Larey Brick RN Navigator Cardiac Imaging [facility] [phone] office [phone] cell  Patient to take 100mg  metoprolol tartrate two hours prior to his cardiac CT scan.  He is aware to arrive at 3 PM.

## 2023-05-01 ENCOUNTER — Ambulatory Visit (HOSPITAL_COMMUNITY): Admission: RE | Admit: 2023-05-01 | Payer: Medicaid Other | Source: Ambulatory Visit

## 2023-05-18 ENCOUNTER — Other Ambulatory Visit (HOSPITAL_COMMUNITY): Payer: Self-pay

## 2023-05-18 ENCOUNTER — Telehealth (HOSPITAL_COMMUNITY): Payer: Self-pay | Admitting: *Deleted

## 2023-05-18 ENCOUNTER — Telehealth: Payer: Self-pay

## 2023-05-18 NOTE — Telephone Encounter (Signed)
Pharmacy Patient Advocate Encounter   Received notification from CoverMyMeds that prior authorization for ozempic 1mg  dose is required/requested.   The patient is insured through [facility] MEDICAID .   PA required; PA submitted to above mentioned insurance via CoverMyMeds Key/confirmation #/EOC [id number]. Status is pending

## 2023-05-18 NOTE — Telephone Encounter (Signed)
Attempted to call patient regarding upcoming cardiac CT appointment. Left message on voicemail with name and callback number Hayley Sharpe RN Navigator Cardiac Imaging [facility] [phone] Office   

## 2023-05-19 ENCOUNTER — Telehealth: Payer: Self-pay

## 2023-05-19 ENCOUNTER — Ambulatory Visit (HOSPITAL_COMMUNITY): Admission: RE | Admit: 2023-05-19 | Payer: Medicaid Other | Source: Ambulatory Visit

## 2023-05-19 NOTE — Telephone Encounter (Signed)
-----   Message from Catano R Madireddy sent at 05/19/2023 10:38 AM EST ----- Melene,  Please see what he would like to do and what the barriers have been to complete the test.  If he has been remaining asymptomatic and wants to hold off any further testing at this time, advised him to keep in close follow-up with his PCP and see us  as needed. Thank you ----- Message ----- From: Nada Chantal BROCKS, RN Sent: 05/19/2023   9:11 AM EST To: Camie CHRISTELLA Shutter, RN; Sid DELENA Seats, RN; #  Hey Dr. Liborio,  We have scheduled this patient three times for his CCTA and he has no showed for all three times. We will not be reaching out to reschedule this patient.  Thank you.  Merle

## 2023-05-19 NOTE — Telephone Encounter (Signed)
 Pharmacy Patient Advocate Encounter  Received notification from [facility] MEDICAID that Prior Authorization for OZEMPIC  1MG  has been DENIED.  Full denial letter will be uploaded to the media tab. See denial reason below.  On 05/18/2023, we asked your provider for the following important facts or documents: Per your health plan's criteria, this drug is covered if you meet the following: Your doctor tells us  that this drug is working for your condition.  PA #/Case ID/Reference #: [id number]  Updated chart notes will need to be attached.

## 2023-05-19 NOTE — Telephone Encounter (Signed)
Left VM to return call 

## 2023-05-21 NOTE — Telephone Encounter (Signed)
 Left vm to return call.

## 2023-05-22 ENCOUNTER — Ambulatory Visit: Payer: Self-pay

## 2023-06-30 ENCOUNTER — Other Ambulatory Visit: Payer: Self-pay | Admitting: Family Medicine

## 2023-06-30 DIAGNOSIS — R12 Heartburn: Secondary | ICD-10-CM

## 2023-08-03 ENCOUNTER — Ambulatory Visit (INDEPENDENT_AMBULATORY_CARE_PROVIDER_SITE_OTHER): Admitting: Student

## 2023-08-03 ENCOUNTER — Encounter: Payer: Self-pay | Admitting: Student

## 2023-08-03 VITALS — BP 146/84 | Temp 98.1°F | Ht 64.0 in | Wt 262.6 lb

## 2023-08-03 DIAGNOSIS — R519 Headache, unspecified: Secondary | ICD-10-CM | POA: Diagnosis not present

## 2023-08-03 DIAGNOSIS — Z7985 Long-term (current) use of injectable non-insulin antidiabetic drugs: Secondary | ICD-10-CM

## 2023-08-03 DIAGNOSIS — E1165 Type 2 diabetes mellitus with hyperglycemia: Secondary | ICD-10-CM

## 2023-08-03 LAB — POCT GLYCOSYLATED HEMOGLOBIN (HGB A1C): HbA1c, POC (controlled diabetic range): 10.7 % — AB (ref 0.0–7.0)

## 2023-08-03 NOTE — Patient Instructions (Signed)
 Pleasure to see you today.  I am concerned about your headaches due to abnormal finsing on your exam which you will pill pills and neck pain.  Sometimes this could be concerns for stroke or rupture of one of your blood vessels in your neck.  Notably I would like to send you to the ED to get all imaging and to rule out stroke however due to your current situation I have placed imaging for head and neck CT scan and brain MRI to rule out any brain problems.

## 2023-08-03 NOTE — Progress Notes (Signed)
    SUBJECTIVE:   CHIEF COMPLAINT / HPI:   Patient is a 53 y.o. year old male presenting with headache   Onset: 3 weeks Location: Starts neck, up to occipital and wraps around to the front Quality: throbbing Frequency: 2-3 times a week.  Precipitating factors: Light Prior treatment: Goody powder with mild to moderate    Associated Symptoms Nausea/vomiting: No Photophobia/phonophobia: Yes Tearing of eyes:Yes Sinus pain/pressure: No Family hx migraine: No Relation to menstrual cycle:   Red Flags Fever: No Neck pain/stiffness: No Vision/speech/swallow/hearing difficulty: No Focal weakness/numbness: No Altered mental status: No Trauma:  No  Recent eye exam 2 months ago and still endorses vision still fussy No headache today  PERTINENT  PMH / PSH: Reviewed   OBJECTIVE:   BP (!) 146/84   Temp 98.1 F (36.7 C)   Ht 5\' 4"  (1.626 m)   Wt 262 lb 9.6 oz (119.1 kg)   SpO2 97%   BMI 45.08 kg/m    Physical Exam General: Alert, well appearing, NAD Cardiovascular: RRR, No Murmurs, Normal S2/S2 Respiratory: CTAB, No wheezing or Rales Abdomen: No distension or tenderness Extremities: No edema on extremities   Neuro:  right facial drop, right pupil sluggishly to light  Normal ocular motor movement  Facial sensation intact Centralized [place] Normal tongue protrusion with normal speech Hearing in tact bilaterally Normal strength on UE and LE with sensation in tact    ASSESSMENT/PLAN:   Acute intractable headache Subacute headache in patient with history of migraine headache who reports nature of headache to be different from prior episodes of migraines. Unclear cause of headache but concerning given nature of headache and exam findings.  Differentials include migraine, carotid dissection, stroke, papilledema, or glaucoma.  Low concerns for meningitis given patient of symptoms and signs of constitutional symptoms.  Given exam findings discussed with patient at great lengths  need for urgent imaging and assessment.  Recommended ED for full work up however patient expressed he's unable to go to the ED at this time because he needs to return the family car home and settled on the option of getting brain MRI and Neck CTA scheduled for the next day.  Risk of delayed imaging discussed with patient who verbalized understanding.  Scheduled MRI brain and CT angiogram of neck stat and scheduled for the earliest appointment which patient chose appointment of 3/19. ED precaution discussed with patient.    Jerre Simon, MD [facility]

## 2023-08-04 ENCOUNTER — Ambulatory Visit (HOSPITAL_COMMUNITY)
Admission: RE | Admit: 2023-08-04 | Discharge: 2023-08-04 | Disposition: A | Discharge status: Home / Self Care | Payer: PRIVATE HEALTH INSURANCE | Source: Ambulatory Visit | Attending: Family Medicine | Admitting: Family Medicine

## 2023-08-04 DIAGNOSIS — R519 Headache, unspecified: Secondary | ICD-10-CM | POA: Diagnosis present

## 2023-08-04 MED ORDER — IOHEXOL 350 MG/ML SOLN
75.0000 mL | Freq: Once | INTRAVENOUS | Status: AC | PRN
  Administered 2023-08-04: 75 mL via INTRAVENOUS

## 2023-08-04 MED ORDER — GADOBUTROL 1 MMOL/ML IV SOLN
10.0000 mL | Freq: Once | INTRAVENOUS | Status: AC | PRN
Start: 2023-08-04 — End: 2023-08-04
  Administered 2023-08-04: 10 mL via INTRAVENOUS

## 2023-08-04 NOTE — Assessment & Plan Note (Signed)
 Subacute headache in patient with history of migraine headache who reports nature of headache to be different from prior episodes of migraines. Unclear cause of headache but concerning given nature of headache and exam findings.  Differentials include migraine, carotid dissection, stroke, papilledema, or glaucoma.  Low concerns for meningitis given patient of symptoms and signs of constitutional symptoms.  Given exam findings discussed with patient at great lengths need for urgent imaging and assessment.  Recommended ED for full work up however patient expressed he's unable to go to the ED at this time because he needs to return the family car home and settled on the option of getting brain MRI and Neck CTA scheduled for the next day.  Risk of delayed imaging discussed with patient who verbalized understanding.  Scheduled MRI brain and CT angiogram of neck stat and scheduled for the earliest appointment which patient chose appointment of 3/19. ED precaution discussed with patient.

## 2023-08-10 ENCOUNTER — Telehealth: Payer: Self-pay | Admitting: Student

## 2023-08-10 NOTE — Telephone Encounter (Signed)
 Called patient to discuss findings of his recent Head and neck imaging. Unfortunately unable to reach patient and mail box is full. Will try to call again later.

## 2023-08-11 ENCOUNTER — Telehealth: Payer: Self-pay | Admitting: Student

## 2023-08-11 DIAGNOSIS — I6522 Occlusion and stenosis of left carotid artery: Secondary | ICD-10-CM

## 2023-08-11 NOTE — Telephone Encounter (Signed)
 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  Patient's was amendable to plan.  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

## 2023-08-18 ENCOUNTER — Other Ambulatory Visit: Payer: Self-pay | Admitting: Student

## 2023-08-18 DIAGNOSIS — G902 Horner's syndrome: Secondary | ICD-10-CM

## 2023-08-18 DIAGNOSIS — R059 Cough, unspecified: Secondary | ICD-10-CM

## 2023-08-18 DIAGNOSIS — R930 Abnormal findings on diagnostic imaging of skull and head, not elsewhere classified: Secondary | ICD-10-CM

## 2023-08-18 NOTE — Progress Notes (Signed)
 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

## 2023-08-20 ENCOUNTER — Telehealth: Payer: Self-pay

## 2023-08-20 NOTE — Telephone Encounter (Signed)
-----   Message from [facility] Jasmine December S sent at 08/20/2023 12:34 PM EDT ----- Ok to schedule CT Chest at [facility].  Thank you, Clemencia Course, CMA

## 2023-08-20 NOTE — Telephone Encounter (Signed)
 Called scheduling to schedule a CT Chest for patient.  Able to set appointment for 08/27/2023 at 12:30pm with check in time at 12:15pm at [facility], Entrance A.  Called patient to provide him with the CT appointment information.  Patient understood information provided and asked if appointment would be on MyChart.   Informed patient that the appointment is on MyChart as well.  Drusilla Kanner, CMA

## 2023-08-24 ENCOUNTER — Telehealth: Payer: Self-pay | Admitting: Pharmacist

## 2023-08-24 NOTE — Telephone Encounter (Signed)
 Attempted to contact patient for follow-up of most recent A1C control and review of CGM showing GMI of 8.6 over the last two weeks which is improved however remains greater than goal A1C range.    Left HIPAA compliant voice mail requesting call back to direct phone: [phone]  OR [phone] to schedule an appointment.  Total time with patient call and documentation of interaction: 12 minutes.

## 2023-08-27 ENCOUNTER — Ambulatory Visit (HOSPITAL_COMMUNITY)
Admission: RE | Admit: 2023-08-27 | Discharge: 2023-08-27 | Disposition: A | Discharge status: Home / Self Care | Payer: PRIVATE HEALTH INSURANCE | Source: Ambulatory Visit | Attending: Family Medicine | Admitting: Family Medicine

## 2023-08-27 DIAGNOSIS — G902 Horner's syndrome: Secondary | ICD-10-CM | POA: Insufficient documentation

## 2023-09-01 ENCOUNTER — Other Ambulatory Visit: Payer: Self-pay | Admitting: Student

## 2023-09-01 DIAGNOSIS — G8929 Other chronic pain: Secondary | ICD-10-CM

## 2023-10-23 ENCOUNTER — Other Ambulatory Visit: Payer: Self-pay | Admitting: Family Medicine

## 2023-10-23 ENCOUNTER — Other Ambulatory Visit: Payer: Self-pay | Admitting: Student

## 2023-10-23 DIAGNOSIS — E118 Type 2 diabetes mellitus with unspecified complications: Secondary | ICD-10-CM

## 2023-10-23 DIAGNOSIS — E1165 Type 2 diabetes mellitus with hyperglycemia: Secondary | ICD-10-CM

## 2023-10-26 ENCOUNTER — Telehealth: Payer: Self-pay

## 2023-10-26 NOTE — Telephone Encounter (Signed)
 Pharmacy Patient Advocate Encounter   Received notification from CoverMyMeds that prior authorization for JARDIANCE  is required/requested.   Insurance verification completed.   The patient is insured through [facility] MEDICAID .   PA required; PA submitted to above mentioned insurance via CoverMyMeds Key/confirmation #/EOC [id number] Status is pending

## 2023-10-27 NOTE — Telephone Encounter (Signed)
 Pharmacy Patient Advocate Encounter  Received notification from [facility] MEDICAID that Prior Authorization for JARDIANCE  25MG  has been APPROVED from 10/26/23 to 10/25/24   PA #/Case ID/Reference #: [id number]

## 2023-10-29 ENCOUNTER — Telehealth: Payer: Self-pay

## 2023-10-29 NOTE — Telephone Encounter (Signed)
 Pharmacy Patient Advocate Encounter   Received notification from CoverMyMeds that prior authorization for OZEMPIC  1MG  is required/requested.   Insurance verification completed.   The patient is insured through [facility] MEDICAID .   PA required; PA submitted to above mentioned insurance via CoverMyMeds Key/confirmation #/EOC [id number]. Status is pending

## 2023-10-29 NOTE — Telephone Encounter (Signed)
 Pharmacy Patient Advocate Encounter   Received notification from CoverMyMeds that prior authorization for [facility] G7 SENSOR is required/requested.   Insurance verification completed.   The patient is insured through [facility] MEDICAID .   PA required; PA submitted to above mentioned insurance via CoverMyMeds Key/confirmation #/EOC [id number]. Status is pending  Future RX for sensors will need to be written for quantity of 3 for 30 days.

## 2023-10-30 NOTE — Telephone Encounter (Signed)
 Pharmacy Patient Advocate Encounter  Received notification from [facility] MEDICAID that Prior Authorization for [facility] G7 SENSOR has been APPROVED from 10/29/23 to 10/28/24   PA #/Case ID/Reference #: [id number]

## 2023-10-30 NOTE — Telephone Encounter (Signed)
 Pharmacy Patient Advocate Encounter  Received notification from [facility] MEDICAID that Prior Authorization for OZEMPIC  1MG  has been DENIED.  Full denial letter will be uploaded to the media tab. See denial reason below.  Per your health plan's criteria, this drug is covered if you meet the following: Your doctor tells us  that this drug is working for your condition. The information provided does not show that you meet the criteria listed above.  Patient will need an appt  PA #/Case ID/Reference #: [id number]

## 2023-11-02 ENCOUNTER — Encounter: Payer: Self-pay | Admitting: Student

## 2023-11-02 ENCOUNTER — Ambulatory Visit (INDEPENDENT_AMBULATORY_CARE_PROVIDER_SITE_OTHER): Admitting: Student

## 2023-11-02 VITALS — BP 145/71 | HR 89 | Ht 64.0 in | Wt 277.6 lb

## 2023-11-02 DIAGNOSIS — E1165 Type 2 diabetes mellitus with hyperglycemia: Secondary | ICD-10-CM | POA: Diagnosis not present

## 2023-11-02 DIAGNOSIS — E119 Type 2 diabetes mellitus without complications: Secondary | ICD-10-CM | POA: Diagnosis present

## 2023-11-02 DIAGNOSIS — I10 Essential (primary) hypertension: Secondary | ICD-10-CM

## 2023-11-02 DIAGNOSIS — R12 Heartburn: Secondary | ICD-10-CM | POA: Diagnosis not present

## 2023-11-02 DIAGNOSIS — Z1211 Encounter for screening for malignant neoplasm of colon: Secondary | ICD-10-CM | POA: Diagnosis not present

## 2023-11-02 DIAGNOSIS — Z794 Long term (current) use of insulin: Secondary | ICD-10-CM

## 2023-11-02 MED ORDER — OMEPRAZOLE 40 MG PO CPDR: 40.0000 mg | DELAYED_RELEASE_CAPSULE | Freq: Every day | ORAL | 3 refills | Status: DC

## 2023-11-02 MED ORDER — LISINOPRIL 5 MG PO TABS: 5.0000 mg | ORAL_TABLET | Freq: Every day | ORAL | 3 refills | Status: DC

## 2023-11-02 MED ORDER — DEXCOM G7 SENSOR MISC: 1.0000 | Freq: Every day | 10 refills | Status: DC

## 2023-11-02 MED ORDER — MOUNJARO 5 MG/0.5ML ~~LOC~~ SOAJ: 5.0000 mg | SUBCUTANEOUS | 2 refills | Status: DC

## 2023-11-02 NOTE — Assessment & Plan Note (Signed)
 Poorly controlled diabetes, A1c 9.5. Medication access issues. On Lantus , Jardiance ; Ozempic  unavailable. Limited glucose monitoring. Plan to switch to Mounjaro, [place] accepted. - Switch to Mounjaro, starting at 5 mg weekly, with potential increase to 7.5 mg if tolerated. - Resend Dexcom prescription to pharmacy. - Collect urine sample for kidney function assessment. - Advised completion of annual diabetes eye exam - Patient deferred foot exam today -Will make adjustment to Lantus  pending when patient is able to restart daily blood glucose moniotring

## 2023-11-02 NOTE — Patient Instructions (Addendum)
 Pleasure to meet you today.  Your last A1c was 9.5 about 12 days ago.  However given that you have not had a way of checking your sugars daily I will not make any medication adjustments at this time until you have received your Dexcom.  I have resent your Dexcom to the pharmacy and also per discussion from today I have switched your Ozempic  to Mounjaro.  You should take 5 mg weekly and if well-tolerated on in the next month we will go up to 7.5 mg.  Your blood pressure today was elevated I have refilled your lisinopril  for your blood pressure.  Please make sure you are taking these medications as prescribed and check your blood pressures daily if still elevated please return please make an adjustment to your blood pressure medication.  I have placed a referral to gastroenterology for your colon cancer screening.  You should be expecting a call from them to schedule an appointment.  Today we collected your urine sample to test your kidney function.  Please make sure to complete your annual diabetes eye exam

## 2023-11-02 NOTE — Progress Notes (Signed)
    SUBJECTIVE:   CHIEF COMPLAINT / HPI:   Derrick Gonzales is a 53 year old male with diabetes and hypertension who presents for DM follow up.  He has diabetes with a recent A1c of 9.5. Medicaid issues have affected his ability to obtain medications, including Ozempic , which is on backorder. He takes Lantus  54 units daily and cannot regularly check blood sugars due to the lack of a Dexcom device. He manages his condition by monitoring his diet and taking golden berberine. Denies any Hyperglycemic or hypoglycemic episodes.  He has not been taking lisinopril  as prescribed. He is on Jardiance  for kidney protection, which is costly without insurance.  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  PERTINENT  PMH / PSH: Reviewed   OBJECTIVE:   BP (!) 145/71   Pulse 89   Ht 5' 4 (1.626 m)   Wt 277 lb 9.6 oz (125.9 kg)   SpO2 97%   BMI 47.65 kg/m    Physical Exam General: Alert, morbidly obese NAD Cardiovascular: RRR, No Murmurs, Normal S2/S2 Respiratory: CTAB, No wheezing or Rales Abdomen: No distension or tenderness Extremities: No edema on extremities    ASSESSMENT/PLAN:   DM2 (diabetes mellitus, type 2) (HCC) Poorly controlled diabetes, A1c 9.5. Medication access issues. On Lantus , Jardiance ; Ozempic  unavailable. Limited glucose monitoring. Plan to switch to Mounjaro, [place] accepted. - Switch to Mounjaro, starting at 5 mg weekly, with potential increase to 7.5 mg if tolerated. - Resend Dexcom prescription to pharmacy. - Collect urine sample for kidney function assessment. - Advised completion of annual diabetes eye exam - Patient deferred foot exam today -Will make adjustment to Lantus  pending when patient is able to restart daily blood glucose moniotring  Benign essential HTN Slightly elevated blood pressure. Non-adherence to lisinopril . - Refill lisinopril   prescription. - Advise adherence to blood pressure medication and daily monitoring. - Return for medication adjustment if blood pressure remains elevated.  Colon cancer screening Placed GI referral for colonoscopy.  Goble Last, MD [facility]

## 2023-11-02 NOTE — Assessment & Plan Note (Signed)
 Slightly elevated blood pressure. Non-adherence to lisinopril . - Refill lisinopril  prescription. - Advise adherence to blood pressure medication and daily monitoring. - Return for medication adjustment if blood pressure remains elevated.

## 2023-11-02 NOTE — Addendum Note (Signed)
 Addended by: Gwynneth Fabio C on: 11/02/2023 11:04 AM   Modules accepted: Orders

## 2023-11-03 LAB — MICROALBUMIN / CREATININE URINE RATIO
Creatinine, Urine: 22.5 mg/dL
Microalb/Creat Ratio: <13 mg/g{creat} (ref 0–29)
Microalbumin, Urine: <3 ug/mL

## 2023-11-04 ENCOUNTER — Other Ambulatory Visit (HOSPITAL_COMMUNITY): Payer: Self-pay

## 2023-11-09 NOTE — Telephone Encounter (Signed)
 Pharmacy Patient Advocate Encounter   Received notification from CoverMyMeds that prior authorization for MOUNJARO 5MG  is required/requested.   Insurance verification completed.   The patient is insured through [facility] MEDICAID .   PA required; PA submitted to above mentioned insurance via CoverMyMeds Key/confirmation #/EOC [id number]. Status is pending

## 2023-11-10 NOTE — Telephone Encounter (Signed)
 Pharmacy Patient Advocate Encounter  Received notification from [facility] MEDICAID that Prior Authorization for MOUNJARO 5MG  has been APPROVED from 11/09/23 to 11/08/24   PA #/Case ID/Reference #:  [id number]

## 2023-11-12 ENCOUNTER — Other Ambulatory Visit: Payer: Self-pay | Admitting: Family Medicine

## 2023-11-12 DIAGNOSIS — E1165 Type 2 diabetes mellitus with hyperglycemia: Secondary | ICD-10-CM

## 2023-11-25 ENCOUNTER — Telehealth: Payer: Self-pay | Admitting: Pharmacist

## 2023-11-25 NOTE — Telephone Encounter (Signed)
 Attempted to contact patient for follow-up of opportunity to reschedule for assistance with Diabetes.  Review of CGM shows Avg glucose of 299 for last 7 days.   Left HIPAA compliant voice mail requesting call back to direct phone: [phone]  Total time with patient call and documentation of interaction: 4 minutes.

## 2023-11-30 ENCOUNTER — Encounter: Payer: Self-pay | Admitting: Neurology

## 2023-11-30 ENCOUNTER — Ambulatory Visit: Admitting: Neurology

## 2023-12-27 ENCOUNTER — Other Ambulatory Visit: Payer: Self-pay | Admitting: Student

## 2023-12-27 DIAGNOSIS — E7800 Pure hypercholesterolemia, unspecified: Secondary | ICD-10-CM

## 2024-01-20 ENCOUNTER — Ambulatory Visit (INDEPENDENT_AMBULATORY_CARE_PROVIDER_SITE_OTHER): Admitting: Student

## 2024-01-20 ENCOUNTER — Encounter: Payer: Self-pay | Admitting: Student

## 2024-01-20 VITALS — BP 134/77 | HR 82 | Ht 64.0 in | Wt 258.2 lb

## 2024-01-20 DIAGNOSIS — R5383 Other fatigue: Secondary | ICD-10-CM | POA: Diagnosis not present

## 2024-01-20 DIAGNOSIS — I10 Essential (primary) hypertension: Secondary | ICD-10-CM

## 2024-01-20 DIAGNOSIS — E1165 Type 2 diabetes mellitus with hyperglycemia: Secondary | ICD-10-CM

## 2024-01-20 LAB — POCT GLYCOSYLATED HEMOGLOBIN (HGB A1C): HbA1c, POC (controlled diabetic range): 8.3 % — AB (ref 0.0–7.0)

## 2024-01-20 NOTE — Patient Instructions (Signed)
 It was great to see you! Thank you for allowing me to participate in your care!   I recommend that you always bring your medications to each appointment as this makes it easy to ensure we are on the correct medications and helps us  not miss when refills are needed.  Our plans for today:  - Follow-up in 2 weeks - We are checking some labs today, I will call you if they are abnormal will send you a MyChart message or a letter if they are normal.  If you do not hear about your labs in the next 2 weeks please let us  know.  Take care and seek immediate care sooner if you develop any concerns. Please remember to show up 15 minutes before your scheduled appointment time!  Gladis Church, DO [facility]

## 2024-01-20 NOTE — Assessment & Plan Note (Signed)
 Well-controlled.  Continue lisinopril.

## 2024-01-20 NOTE — Assessment & Plan Note (Signed)
 A1c of 8.3, improved from 10.7. - Continue current regimen as listed above in HPI

## 2024-01-20 NOTE — Progress Notes (Signed)
    SUBJECTIVE:   CHIEF COMPLAINT / HPI:   HTN  T2DM Follow-up of type IIa's and hypertension.  Reports adherence to lisinopril .   Reports adherence to Lantus  54 units, Mounjaro , metformin , jardiance .  Tick bite  Fatigue Reports multiple tick bites over the past month.  Tick bites occurred in [place] .  Reports that last weekend he slept all day, he has never felt this type before.  He is also having bilateral/symmetric elbow/hip/knee pain which is new for him.  He did have a sore throat a week ago.  No fevers, chills, nausea vomiting diarrhea, rash. He has been checking his blood sugars when he feels fatigued, they have been normal range.  His GMI on libre shows 7.5.  Additionally patient is taking a statin, however as well as for many years without history of myalgias.  OBJECTIVE:   BP 134/77   Pulse 82   Ht 5' 4 (1.626 m)   Wt 258 lb 3.2 oz (117.1 kg)   SpO2 97%   BMI 44.32 kg/m    General: NAD, pleasant, Cardio: RRR, no MRG. Cap Refill <2s. Respiratory: CTAB, normal wob on RA GI: Abdomen is soft, not tender, not distended. BS present Skin: Multiple healing tick bites along left leg and left abdomen.  No targetoid lesions, no rash, no drainage, no retained products.  ASSESSMENT/PLAN:   Assessment & Plan Type 2 diabetes mellitus with hyperglycemia, without long-term current use of insulin  (HCC) A1c of 8.3, improved from 10.7. - Continue current regimen as listed above in HPI Benign essential HTN Well-controlled - Continue lisinopril  Fatigue, unspecified type Question utility in testing for Lyme given unreliability of test.  Arthralgias, appear to be quicker onset than expected for Lyme-no other symptoms or signs of Lyme.  Additionally given arthralgias, reactive arthritis remains in the differential.  Other differential includes: Anemia, thyroid  disease, disordered sleep. Will obtain screening labs today. - TSH, CBC differential, BMP - Follow-up in 2  weeks - If no improvement, or develops targetoid rash could consider treating empirically for Lyme disease    Gladis Church, DO [facility]

## 2024-01-21 ENCOUNTER — Ambulatory Visit: Payer: Self-pay | Admitting: Student

## 2024-01-21 ENCOUNTER — Encounter: Payer: Self-pay | Admitting: Family Medicine

## 2024-01-21 LAB — CBC WITH DIFFERENTIAL/PLATELET
Basophils Absolute: 0.1 x10E3/uL (ref 0.0–0.2)
Basos: 1 %
EOS (ABSOLUTE): 0.4 x10E3/uL (ref 0.0–0.4)
Eos: 4 %
Hematocrit: 47.4 % (ref 37.5–51.0)
Hemoglobin: 15.5 g/dL (ref 13.0–17.7)
Immature Grans (Abs): 0 x10E3/uL (ref 0.0–0.1)
Immature Granulocytes: 0 %
Lymphocytes Absolute: 2.8 x10E3/uL (ref 0.7–3.1)
Lymphs: 29 %
MCH: 30 pg (ref 26.6–33.0)
MCHC: 32.7 g/dL (ref 31.5–35.7)
MCV: 92 fL (ref 79–97)
Monocytes Absolute: 0.8 x10E3/uL (ref 0.1–0.9)
Monocytes: 8 %
Neutrophils Absolute: 5.5 x10E3/uL (ref 1.4–7.0)
Neutrophils: 58 %
Platelets: 217 x10E3/uL (ref 150–450)
RBC: 5.17 x10E6/uL (ref 4.14–5.80)
RDW: 12.9 % (ref 11.6–15.4)
WBC: 9.6 x10E3/uL (ref 3.4–10.8)

## 2024-01-21 LAB — BASIC METABOLIC PANEL WITH GFR
BUN/Creatinine Ratio: 14 (ref 9–20)
BUN: 10 mg/dL (ref 6–24)
CO2: 21 mmol/L (ref 20–29)
Calcium: 9.2 mg/dL (ref 8.7–10.2)
Chloride: 105 mmol/L (ref 96–106)
Creatinine, Ser: 0.74 mg/dL — ABNORMAL LOW (ref 0.76–1.27)
Glucose: 183 mg/dL — ABNORMAL HIGH (ref 70–99)
Potassium: 4.4 mmol/L (ref 3.5–5.2)
Sodium: 143 mmol/L (ref 134–144)
eGFR: 110 mL/min/1.73 (ref >59)

## 2024-01-21 LAB — TSH RFX ON ABNORMAL TO FREE T4: TSH: 1.87 u[IU]/mL (ref 0.450–4.500)

## 2024-01-26 ENCOUNTER — Ambulatory Visit: Admitting: Family Medicine

## 2024-02-05 ENCOUNTER — Ambulatory Visit: Payer: PRIVATE HEALTH INSURANCE | Admitting: Family Medicine

## 2024-02-06 ENCOUNTER — Other Ambulatory Visit: Payer: Self-pay | Admitting: Student

## 2024-02-06 DIAGNOSIS — Z794 Long term (current) use of insulin: Secondary | ICD-10-CM

## 2024-02-17 ENCOUNTER — Ambulatory Visit: Payer: PRIVATE HEALTH INSURANCE | Admitting: Family Medicine

## 2024-02-24 ENCOUNTER — Other Ambulatory Visit: Payer: Self-pay | Admitting: Family Medicine

## 2024-02-24 DIAGNOSIS — E119 Type 2 diabetes mellitus without complications: Secondary | ICD-10-CM

## 2024-03-20 ENCOUNTER — Other Ambulatory Visit: Payer: Self-pay | Admitting: Student

## 2024-03-29 ENCOUNTER — Encounter: Payer: Self-pay | Admitting: Student

## 2024-03-29 ENCOUNTER — Ambulatory Visit (INDEPENDENT_AMBULATORY_CARE_PROVIDER_SITE_OTHER): Payer: PRIVATE HEALTH INSURANCE | Admitting: Student

## 2024-03-29 DIAGNOSIS — E7800 Pure hypercholesterolemia, unspecified: Secondary | ICD-10-CM

## 2024-03-29 DIAGNOSIS — N529 Male erectile dysfunction, unspecified: Secondary | ICD-10-CM

## 2024-03-29 DIAGNOSIS — E1165 Type 2 diabetes mellitus with hyperglycemia: Secondary | ICD-10-CM | POA: Diagnosis not present

## 2024-03-29 DIAGNOSIS — I10 Essential (primary) hypertension: Secondary | ICD-10-CM | POA: Diagnosis present

## 2024-03-29 DIAGNOSIS — E118 Type 2 diabetes mellitus with unspecified complications: Secondary | ICD-10-CM | POA: Diagnosis not present

## 2024-03-29 DIAGNOSIS — M546 Pain in thoracic spine: Secondary | ICD-10-CM

## 2024-03-29 DIAGNOSIS — G8929 Other chronic pain: Secondary | ICD-10-CM

## 2024-03-29 DIAGNOSIS — R12 Heartburn: Secondary | ICD-10-CM | POA: Diagnosis not present

## 2024-03-29 MED ORDER — ROSUVASTATIN CALCIUM 40 MG PO TABS: 40.0000 mg | ORAL_TABLET | Freq: Every day | ORAL | 3 refills | Status: AC

## 2024-03-29 MED ORDER — BACLOFEN 5 MG PO TABS
1.0000 | ORAL_TABLET | Freq: Every evening | ORAL | 2 refills | Status: AC | PRN
Start: 2024-03-29 — End: ?

## 2024-03-29 MED ORDER — OMEPRAZOLE 40 MG PO CPDR: 40.0000 mg | DELAYED_RELEASE_CAPSULE | Freq: Every day | ORAL | 3 refills | Status: AC

## 2024-03-29 MED ORDER — MOUNJARO 10 MG/0.5ML ~~LOC~~ SOAJ: 10.0000 mg | SUBCUTANEOUS | 5 refills | Status: AC

## 2024-03-29 MED ORDER — TADALAFIL 10 MG PO TABS: 10.0000 mg | ORAL_TABLET | Freq: Every day | ORAL | 2 refills | Status: AC | PRN

## 2024-03-29 MED ORDER — EMPAGLIFLOZIN 25 MG PO TABS: 25.0000 mg | ORAL_TABLET | Freq: Every day | ORAL | 3 refills | Status: AC

## 2024-03-29 MED ORDER — METFORMIN HCL ER 500 MG PO TB24: 500.0000 mg | ORAL_TABLET | Freq: Every day | ORAL | 3 refills | Status: AC

## 2024-03-29 MED ORDER — LISINOPRIL 10 MG PO TABS: 10.0000 mg | ORAL_TABLET | Freq: Every day | ORAL | 2 refills | Status: AC

## 2024-03-29 MED ORDER — EZETIMIBE 10 MG PO TABS: 10.0000 mg | ORAL_TABLET | Freq: Every day | ORAL | 3 refills | Status: AC

## 2024-03-29 NOTE — Assessment & Plan Note (Signed)
 Blood glucose levels remain elevated. Mounjaro  and metformin  regimen adjusted due to previous side effects.  A1c is due in a month will complete diabetic foot exam at his next appointment.  Patient will provide results for his ophthalmology exam completed earlier this year. - Increased Mounjaro  to 10 mg. - Continue metformin  500 mg. - Provided instructions for printing A1c results from Dexcom -Completed annual work diabetes management form. Formed scanned and returned to patient at end of visit

## 2024-03-29 NOTE — Progress Notes (Signed)
    SUBJECTIVE:   CHIEF COMPLAINT / HPI:   Derrick Gonzales is a 53 year old male with diabetes mellitus who presents for an annual form completion related to his insulin  use.  He uses a Dexcom device for continuous glucose monitoring, with readings around 200 mg/dL, indicating some improvement but still suboptimal control. He is on Mounjaro  5 mg and metformin  500 mg due to previous gastrointestinal issues with a higher dose. He takes lisinopril  5 mg for hypertension.  He had an eye exam in June 2025 and received new glasses.  His current medications include metformin , lisinopril , Cialis , Zoloft , omeprazole , Jardiance , Zetia , and a muscle relaxer. He does not need a refill for his insulin  pen and has discontinued metoprolol .  PERTINENT  PMH / PSH: Reviewed  OBJECTIVE:   BP (!) 142/79   Pulse 71   Ht 5' 4 (1.626 m)   Wt 253 lb 9.6 oz (115 kg)   SpO2 99%   BMI 43.53 kg/m    Physical Exam General: Alert, well appearing, NAD, Oriented x4 Cardiovascular: RRR, No Murmurs, Normal S2/S2 Respiratory: CTAB, No wheezing or Rales  Skin: Warm and dry  ASSESSMENT/PLAN:   DM2 (diabetes mellitus, type 2) (HCC) Blood glucose levels remain elevated. Mounjaro  and metformin  regimen adjusted due to previous side effects.  A1c is due in a month will complete diabetic foot exam at his next appointment.  Patient will provide results for his ophthalmology exam completed earlier this year. - Increased Mounjaro  to 10 mg. - Continue metformin  500 mg. - Provided instructions for printing A1c results from Dexcom -Completed annual work diabetes management form. Formed scanned and returned to patient at end of visit  Norleen April, MD [facility]

## 2024-03-29 NOTE — Patient Instructions (Signed)
 Pleasure to see you today.  I have completed your work form and attached to with this your A1c results.  I have also refilled your medication.  Your blood pressure today was elevated I will increase your lisinopril  to 10 mg daily.  Please make sure you are checking your blood pressures at home daily and if it continues to be above 130/80 please return so that we can make adjustments to your medication.  I have also increased your Mounjaro  from 5 mg to 10 mg weekly.  If it is well-tolerated in a month we can also increase that.

## 2024-05-23 DIAGNOSIS — E1165 Type 2 diabetes mellitus with hyperglycemia: Secondary | ICD-10-CM

## 2024-05-23 MED ORDER — DEXCOM G7 SENSOR MISC: 1.0000 | Freq: Every day | 10 refills | Status: AC

## 2024-05-26 ENCOUNTER — Other Ambulatory Visit: Payer: Self-pay

## 2024-05-26 DIAGNOSIS — E1165 Type 2 diabetes mellitus with hyperglycemia: Secondary | ICD-10-CM

## 2024-05-26 MED ORDER — DEXCOM G7 SENSOR MISC: 10 refills | Status: AC
